# Patient Record
Sex: Female | Born: 1935 | Race: White | Hispanic: No | Smoking: Never smoker
Health system: Southern US, Community
[De-identification: ages and names within clinical notes are randomized; demographics above are authoritative.]

## PROBLEM LIST (undated history)

## (undated) DIAGNOSIS — I10 Essential (primary) hypertension: Secondary | ICD-10-CM

## (undated) DIAGNOSIS — E785 Hyperlipidemia, unspecified: Secondary | ICD-10-CM

## (undated) DIAGNOSIS — I442 Atrioventricular block, complete: Secondary | ICD-10-CM

## (undated) DIAGNOSIS — I480 Paroxysmal atrial fibrillation: Secondary | ICD-10-CM

## (undated) HISTORY — PX: PULSE GENERATOR IMPLANT: SHX5370

## (undated) HISTORY — DX: Atrioventricular block, complete: I44.2

## (undated) HISTORY — PX: ABDOMINAL HYSTERECTOMY: SHX81

## (undated) HISTORY — PX: EYE SURGERY: SHX253

## (undated) HISTORY — DX: Paroxysmal atrial fibrillation: I48.0

## (undated) HISTORY — DX: Hyperlipidemia, unspecified: E78.5

## (undated) HISTORY — DX: Essential (primary) hypertension: I10

## (undated) HISTORY — PX: GALLBLADDER SURGERY: SHX652

---

## 1997-04-01 HISTORY — PX: OTHER SURGICAL HISTORY: SHX169

## 1997-12-12 ENCOUNTER — Inpatient Hospital Stay (HOSPITAL_COMMUNITY): Admission: AD | Admit: 1997-12-12 | Discharge: 1997-12-14 | Payer: Self-pay | Admitting: Cardiology

## 1997-12-14 ENCOUNTER — Encounter: Payer: Self-pay | Admitting: *Deleted

## 2004-04-01 DIAGNOSIS — I442 Atrioventricular block, complete: Secondary | ICD-10-CM

## 2004-04-01 HISTORY — DX: Atrioventricular block, complete: I44.2

## 2004-04-28 ENCOUNTER — Ambulatory Visit: Payer: Self-pay | Admitting: Cardiology

## 2004-05-11 ENCOUNTER — Ambulatory Visit: Payer: Self-pay | Admitting: Cardiology

## 2004-06-14 ENCOUNTER — Ambulatory Visit: Payer: Self-pay | Admitting: Cardiology

## 2004-06-15 ENCOUNTER — Ambulatory Visit: Payer: Self-pay | Admitting: Cardiology

## 2004-06-20 ENCOUNTER — Ambulatory Visit: Payer: Self-pay | Admitting: Cardiology

## 2004-06-20 ENCOUNTER — Ambulatory Visit (HOSPITAL_COMMUNITY): Admission: RE | Admit: 2004-06-20 | Discharge: 2004-06-20 | Payer: Self-pay | Admitting: Cardiology

## 2004-07-25 ENCOUNTER — Ambulatory Visit: Payer: Self-pay | Admitting: Cardiology

## 2004-07-25 ENCOUNTER — Ambulatory Visit: Payer: Self-pay | Admitting: Internal Medicine

## 2004-10-18 ENCOUNTER — Ambulatory Visit: Payer: Self-pay | Admitting: Cardiology

## 2005-05-01 ENCOUNTER — Ambulatory Visit: Payer: Self-pay

## 2005-06-27 ENCOUNTER — Ambulatory Visit: Payer: Self-pay | Admitting: Cardiology

## 2005-10-04 ENCOUNTER — Ambulatory Visit: Payer: Self-pay | Admitting: Internal Medicine

## 2006-01-06 ENCOUNTER — Ambulatory Visit: Payer: Self-pay | Admitting: Internal Medicine

## 2006-03-31 ENCOUNTER — Ambulatory Visit: Payer: Self-pay | Admitting: Internal Medicine

## 2006-06-23 ENCOUNTER — Ambulatory Visit: Payer: Self-pay | Admitting: Internal Medicine

## 2006-09-15 ENCOUNTER — Ambulatory Visit: Payer: Self-pay | Admitting: Internal Medicine

## 2006-10-30 ENCOUNTER — Ambulatory Visit: Payer: Self-pay | Admitting: Cardiology

## 2006-12-08 ENCOUNTER — Ambulatory Visit: Payer: Self-pay | Admitting: Internal Medicine

## 2007-03-02 ENCOUNTER — Ambulatory Visit: Payer: Self-pay | Admitting: Internal Medicine

## 2007-04-09 ENCOUNTER — Ambulatory Visit: Payer: Self-pay | Admitting: Cardiology

## 2007-06-01 ENCOUNTER — Ambulatory Visit: Payer: Self-pay | Admitting: Internal Medicine

## 2007-08-31 ENCOUNTER — Ambulatory Visit: Payer: Self-pay | Admitting: Internal Medicine

## 2007-10-09 ENCOUNTER — Ambulatory Visit: Payer: Self-pay | Admitting: Cardiology

## 2008-02-29 ENCOUNTER — Ambulatory Visit: Payer: Self-pay | Admitting: Internal Medicine

## 2008-05-30 ENCOUNTER — Ambulatory Visit: Payer: Self-pay | Admitting: Internal Medicine

## 2008-07-18 ENCOUNTER — Encounter: Payer: Self-pay | Admitting: Cardiology

## 2008-08-31 ENCOUNTER — Ambulatory Visit: Payer: Self-pay | Admitting: Internal Medicine

## 2008-10-30 DIAGNOSIS — I10 Essential (primary) hypertension: Secondary | ICD-10-CM | POA: Insufficient documentation

## 2008-10-30 DIAGNOSIS — E785 Hyperlipidemia, unspecified: Secondary | ICD-10-CM | POA: Insufficient documentation

## 2008-10-30 DIAGNOSIS — Z950 Presence of cardiac pacemaker: Secondary | ICD-10-CM | POA: Insufficient documentation

## 2008-10-31 ENCOUNTER — Ambulatory Visit: Payer: Self-pay | Admitting: Cardiology

## 2008-11-30 ENCOUNTER — Ambulatory Visit: Payer: Self-pay | Admitting: Internal Medicine

## 2009-03-01 ENCOUNTER — Encounter: Payer: Self-pay | Admitting: Internal Medicine

## 2009-03-01 ENCOUNTER — Ambulatory Visit: Payer: Self-pay | Admitting: Internal Medicine

## 2009-04-24 ENCOUNTER — Ambulatory Visit: Payer: Self-pay

## 2009-04-24 ENCOUNTER — Encounter: Payer: Self-pay | Admitting: Cardiology

## 2009-05-31 ENCOUNTER — Ambulatory Visit: Payer: Self-pay | Admitting: Internal Medicine

## 2009-06-12 ENCOUNTER — Encounter: Payer: Self-pay | Admitting: Internal Medicine

## 2009-08-30 ENCOUNTER — Ambulatory Visit: Payer: Self-pay | Admitting: Internal Medicine

## 2009-11-15 ENCOUNTER — Ambulatory Visit: Payer: Self-pay | Admitting: Cardiology

## 2010-01-26 ENCOUNTER — Encounter (INDEPENDENT_AMBULATORY_CARE_PROVIDER_SITE_OTHER): Payer: Self-pay | Admitting: *Deleted

## 2010-02-28 ENCOUNTER — Ambulatory Visit: Payer: Self-pay | Admitting: Internal Medicine

## 2010-04-25 ENCOUNTER — Encounter: Payer: Self-pay | Admitting: Cardiology

## 2010-04-25 ENCOUNTER — Ambulatory Visit
Admission: RE | Admit: 2010-04-25 | Discharge: 2010-04-25 | Payer: Self-pay | Source: Home / Self Care | Attending: Cardiology | Admitting: Cardiology

## 2010-04-25 ENCOUNTER — Ambulatory Visit: Admission: RE | Admit: 2010-04-25 | Discharge: 2010-04-25 | Payer: Self-pay | Source: Home / Self Care

## 2010-04-25 DIAGNOSIS — I442 Atrioventricular block, complete: Secondary | ICD-10-CM | POA: Insufficient documentation

## 2010-04-26 ENCOUNTER — Encounter: Payer: Self-pay | Admitting: Cardiology

## 2010-05-03 NOTE — Assessment & Plan Note (Signed)
Summary: PACER CHECK      Allergies Added: NKDA  Current Medications (verified): 1)  Hydrochlorothiazide 25 Mg Tabs (Hydrochlorothiazide) .... Take One-Half  Tablet By Mouth Daily. 2)  Diltiazem Hcl Er Beads 240 Mg Xr24h-Cap (Diltiazem Hcl Er Beads) .... Take One Capsule By Mouth Daily 3)  Aspirin 81 Mg Tbec (Aspirin) .... Take One Tablet By Mouth Daily 4)  Multivitamins   Tabs (Multiple Vitamin) .Marland Kitchen.. 1 Tab Once Daily 5)  Simvastatin 40 Mg Tabs (Simvastatin) .... Take One Tablet By Mouth Daily At Bedtime 6)  Metoprolol Succinate 50 Mg Xr24h-Tab (Metoprolol Succinate) .... Take One Tablet By Mouth Daily 7)  Synthroid 75 Mcg Tabs (Levothyroxine Sodium) .... Take 1/2 Tablet By Mouth Daily 8)  Zolpidem Tartrate 5 Mg Tabs (Zolpidem Tartrate) .... Take 1 Tablet By Mouth Once A Day 9)  Loratadine 10 Mg Tabs (Loratadine) .... Take One Tab By Mouth Once Daily As Needed 10)  Alprazolam 0.25 Mg Tabs (Alprazolam) .... Take One Tab By Mouth Two Times A Day As Needed  Allergies (verified): No Known Drug Allergies   PPM Specifications Following MD:  Everardo Beals. Juanda Chance, MD     PPM Vendor:  [employer]     PPM Model Number:  [id number]     PPM Serial Number:  [id number] PPM DOI:  06/20/2004     PPM Implanting MD:  Everardo Beals. Juanda Chance, MD  Lead 1    Location: RA     DOI: 06/21/1996     Model #: 6237     Serial #: [id number]     Status: active Lead 2    Location: RV     DOI: 06/21/1996     Model #: 1761     Serial #: [id number]     Status: active   Indications:  SSS   PPM Follow Up Battery Voltage:  GOOD V     Battery Est. Longevity:  >5 YRS     Pacer Dependent:  No       PPM Device Measurements Atrium  Amplitude: 1.8 mV, Impedance: 530 ohms, Threshold: 0.8 V at 0.40 msec Right Ventricle  Amplitude: PACED mV, Impedance: 1090 ohms, Threshold: 0.6 V at 0.40 msec  Episodes MS Episodes:  5     Percent Mode Switch:  <1%     Coumadin:  No Ventricular High Rate:  0     Atrial Pacing:  73%     Ventricular Pacing:   100%  Parameters Mode:  DDDR     Lower Rate Limit:  60     Upper Rate Limit:  130 Paced AV Delay:  150     Sensed AV Delay:  80 Next Cardiology Appt Due:  10/01/2010 Tech Comments:  5 ATRIAL EPISODES--ALL NOISE ON ATRIAL LEAD.  LONGEST WAS 9 SECONDS.  NORMAL DEVICE FUNCTION.  HISTOGRAMS FLAT ---TURNED RATE RESPONSE ON TO POSSIBLY HELP W/FATIGUE AND SOB.  PT TO BE FOLLOWED BY DR [facility] IN [place] AND PACER CHECKS IN [place]. TTMs THRU [facility].  ROV IN 6 MTHS W/DEVICE CLINIC IN [place]. Vella Kohler  April 25, 2010 9:30 AM

## 2010-05-03 NOTE — Procedures (Signed)
Summary: pacer check  Medications Added SYNTHROID 75 MCG TABS (LEVOTHYROXINE SODIUM) Take 1/2 tablet by mouth daily ZOLPIDEM TARTRATE 5 MG TABS (ZOLPIDEM TARTRATE) Take 1 tablet by mouth once a day      Allergies Added: NKDA  Current Medications (verified): 1)  Hydrochlorothiazide 25 Mg Tabs (Hydrochlorothiazide) .... Take One-Half  Tablet By Mouth Daily. 2)  Diltiazem Hcl Er Beads 240 Mg Xr24h-Cap (Diltiazem Hcl Er Beads) .... Take One Capsule By Mouth Daily 3)  Aspirin 81 Mg Tbec (Aspirin) .... Take One Tablet By Mouth Daily 4)  Multivitamins   Tabs (Multiple Vitamin) .Marland Kitchen.. 1 Tab Once Daily 5)  Simvastatin 40 Mg Tabs (Simvastatin) .... Take One Tablet By Mouth Daily At Bedtime 6)  Metoprolol Succinate 50 Mg Xr24h-Tab (Metoprolol Succinate) .... Take One Tablet By Mouth Daily 7)  Synthroid 75 Mcg Tabs (Levothyroxine Sodium) .... Take 1/2 Tablet By Mouth Daily 8)  Zolpidem Tartrate 5 Mg Tabs (Zolpidem Tartrate) .... Take 1 Tablet By Mouth Once A Day  Allergies (verified): No Known Drug Allergies   PPM Specifications Following MD:  Everardo Beals. Juanda Chance, MD     PPM Vendor:  [employer]     PPM Model Number:  [id number]     PPM Serial Number:  [id number] PPM DOI:  06/20/2004     PPM Implanting MD:  Everardo Beals. Juanda Chance, MD  Lead 1    Location: RA     DOI: 06/21/1996     Model #: 2130     Serial #: [id number]     Status: active Lead 2    Location: RV     DOI: 06/21/1996     Model #: 6962     Serial #: [id number]     Status: active   Indications:  SSS   PPM Follow Up Remote Check?  No Battery Voltage:  MOL V     Battery Est. Longevity:  >5 YEARS     Pacer Dependent:  No       PPM Device Measurements Atrium  Amplitude: 1.9 mV, Impedance: 520 ohms, Threshold: 0.7 V at 0.4 msec Right Ventricle  Amplitude: 12 mV, Impedance: 1260 ohms, Threshold: 0.9 V at 0.4 msec  Episodes MS Episodes:  2     Percent Mode Switch:  0%     Coumadin:  No Ventricular High Rate:  0     Atrial Pacing:  66%     Ventricular  Pacing:  99%  Parameters Mode:  DDD     Lower Rate Limit:  60     Upper Rate Limit:  130 Paced AV Delay:  150     Sensed AV Delay:  80 Next Cardiology Appt Due:  09/29/2009 Tech Comments:  Normal device function.  Both mode switch episodes are noise on atrial lead.  Unable to reproduce today.  Both less than 1 minute. Impedences, thresholds, sensing stable.  Will monitor for now.  Pt does TTM's with [facility].  ROV 6 months BB.  When Dr Angela Cox, pt prefers to be followed in [place] by Dr Antoine Poche and have device checks done in the [place] office. Gypsy Balsam RN BSN  April 24, 2009 11:46 AM

## 2010-05-03 NOTE — Assessment & Plan Note (Signed)
Summary: YEARLY F/U  Medications Added LORATADINE 10 MG TABS (LORATADINE) take one tab by mouth once daily as needed ALPRAZOLAM 0.25 MG TABS (ALPRAZOLAM) take one tab by mouth two times a day as needed      Allergies Added: NKDA  Visit Type:  Follow-up Primary Provider:  Dr Gae Gallop in [place]  CC:  No complaints..  History of Present Illness: . She had a Guidant DDD pacemaker implanted 2006 for AV block. On previous interrogation she has had mode switches which have been thought to be due to noise on the atrial lead. We tried to program around this but have been unable to do so. Her generator is also on recall but the probably of problems aren't low and replaced it is not recommended.  She's been doing quite well has had no chest pain shortness of breath or palpitations.  Her husband passed away about a year and a half ago and she now lives alone although she has children nearby.  Her other problems include hypertension and hyperlipidemia.  She would prefer cardiology followup in [place] and pacemaker follow up in [place] since she lives in [place].  Current Medications (verified): 1)  Hydrochlorothiazide 25 Mg Tabs (Hydrochlorothiazide) .... Take One-Half  Tablet By Mouth Daily. 2)  Diltiazem Hcl Er Beads 240 Mg Xr24h-Cap (Diltiazem Hcl Er Beads) .... Take One Capsule By Mouth Daily 3)  Aspirin 81 Mg Tbec (Aspirin) .... Take One Tablet By Mouth Daily 4)  Multivitamins   Tabs (Multiple Vitamin) .Marland Kitchen.. 1 Tab Once Daily 5)  Simvastatin 40 Mg Tabs (Simvastatin) .... Take One Tablet By Mouth Daily At Bedtime 6)  Metoprolol Succinate 50 Mg Xr24h-Tab (Metoprolol Succinate) .... Take One Tablet By Mouth Daily 7)  Synthroid 75 Mcg Tabs (Levothyroxine Sodium) .... Take 1/2 Tablet By Mouth Daily 8)  Zolpidem Tartrate 5 Mg Tabs (Zolpidem Tartrate) .... Take 1 Tablet By Mouth Once A Day 9)  Loratadine 10 Mg Tabs (Loratadine) .... Take One Tab By Mouth Once Daily As Needed 10)  Alprazolam 0.25 Mg  Tabs (Alprazolam) .... Take One Tab By Mouth Two Times A Day As Needed  Allergies (verified): No Known Drug Allergies  Past History:  Past Medical History: Reviewed history from 10/30/2008 and no changes required.  PAST MEDICAL HISTORY:  1. Status post Guidant Ultra dual-mode, dual-pacing, dual-sensing     generator implanted for complete atrioventricular block 2006 with     some noise on the atrial lead and generator recall for very low incidence of loss of output. 2. Hypertension. 3. Hyperlipidemia. 4. Normal coronary angiography in 1999.      Review of Systems       ROS is negative except as outlined in HPI.   Vital Signs:  Patient profile:   77 year old female Weight:      142 pounds BMI:     26.93 Pulse rate:   60 / minute BP sitting:   140 / 70  Vitals Entered By: Sherri Rad, RN, BSN (November 15, 2009 9:39 AM)  Physical Exam  Additional Exam:  Gen. Well-nourished, in no distress   Neck: No JVD, thyroid not enlarged, no carotid bruits Lungs: No tachypnea, clear without rales, rhonchi or wheezes Cardiovascular: Rhythm regular, PMI not displaced,  heart sounds  normal, no murmurs or gallops, no peripheral edema, pulses normal in all 4 extremities. Abdomen: BS normal, abdomen soft and non-tender without masses or organomegaly, no hepatosplenomegaly. MS: No deformities, no cyanosis or clubbing   Neuro:  No focal  sns   Skin:  no lesions    PPM Specifications Following MD:  Everardo Beals. Juanda Chance, MD     PPM Vendor:  [employer]     PPM Model Number:  [id number]     PPM Serial Number:  [id number] PPM DOI:  06/20/2004     PPM Implanting MD:  Everardo Beals. Juanda Chance, MD  Lead 1    Location: RA     DOI: 06/21/1996     Model #: 0981     Serial #: [id number]     Status: active Lead 2    Location: RV     DOI: 06/21/1996     Model #: 2956     Serial #: [id number]     Status: active   Indications:  SSS   PPM Follow Up Battery Voltage:  GOOD V     Battery Est. Longevity:  >5 YRS     Pacer  Dependent:  No       PPM Device Measurements Atrium  Amplitude: 1.8 mV, Impedance: 550 ohms, Threshold: 0.7 V at 0.40 msec Right Ventricle  Amplitude: PACED mV, Impedance: 1200 ohms, Threshold: 0.6 V at 0.40 msec  Episodes MS Episodes:  4     Percent Mode Switch:  0%     Coumadin:  No Ventricular High Rate:  0     Atrial Pacing:  66%     Ventricular Pacing:  98%  Parameters Mode:  DDD     Lower Rate Limit:  60     Upper Rate Limit:  130 Paced AV Delay:  150     Sensed AV Delay:  80 Next Cardiology Appt Due:  05/02/2010 Tech Comments:  4 ATR EPISODES--LONGEST WAS 9 SECONDS.  2 EPISODES WERE NOISE ON ATRIAL LEAD.  TURNED RATE RESPONSE ON DURING MODE SWITCH AND CHANGED RA OUTPUT FROM 2.5 TO 2.0 V.  ROV IN 6 MTHS W/DEVICE CLINIC IN [place].  PT TRANSFERRING CARE TO DR [facility]. Vella Kohler  November 15, 2009 10:20 AM  Impression & Recommendations:  Problem # 1:  PACEMAKER (ICD-V45.Marland Kitchen01) She has a Guidant DDD pacemaker implanted for AV block. We will interrogate her pacemaker today.  We interrogated the pacemaker today and she had 4 mode switches to which were related to noise. She is greater than 5 years left on her pacemaker longevity. We will plan followup in [place] in 6 months for pacemaker check.  We will make her regular followups with our EP died in [place].  Problem # 2:  HYPERTENSION, BENIGN (ICD-401.1) This appears well controlled on current medications. Her updated medication list for this problem includes:    Hydrochlorothiazide 25 Mg Tabs (Hydrochlorothiazide) .Marland Kitchen... Take one-half  tablet by mouth daily.    Diltiazem Hcl Er Beads 240 Mg Xr24h-cap (Diltiazem hcl er beads) .Marland Kitchen... Take one capsule by mouth daily    Aspirin 81 Mg Tbec (Aspirin) .Marland Kitchen... Take one tablet by mouth daily    Metoprolol Succinate 50 Mg Xr24h-tab (Metoprolol succinate) .Marland Kitchen... Take one tablet by mouth daily  Problem # 3:  HYPERLIPIDEMIA-MIXED (ICD-272.4) This is managed with simvastatin. She had normal coronary  angiography in 1999. Her updated medication list for this problem includes:    Simvastatin 40 Mg Tabs (Simvastatin) .Marland Kitchen... Take one tablet by mouth daily at bedtime  Patient Instructions: 1)  Your physician wants you to follow-up in: 6 months with Dr. Antoine Poche in the [facility] office for regular cardiology followup and a device check.  You will receive a reminder letter in  the mail two months in advance. If you don't receive a letter, please call our office to schedule the follow-up appointment. 2)  Your physician recommends that you continue on your current medications as directed. Please refer to the Current Medication list given to you today.

## 2010-05-03 NOTE — Cardiovascular Report (Signed)
Summary: Office Visit   Office Visit   Imported By: Roderic Ovens 11/17/2009 13:48:00  _____________________________________________________________________  External Attachment:    Type:   Image     Comment:   External Document

## 2010-05-03 NOTE — Assessment & Plan Note (Signed)
Summary: [facility]      Allergies Added: NKDA  Visit Type:  Follow-up Primary Provider:  Dr Gae Gallop in [place]  CC:  [facility].  History of Present Illness: The patient presents for yearly followup of known coronary disease. She was previously seen by Dr. Juanda Chance.  Since she last saw her she has had no new cardiovascular complaints. She does her household she works in activities of daily living without limitations. She has no presyncope or syncope. She has no chest pressure, neck or arm discomfort. She has no shortness of breath, PND or orthopnea. She had her pacemaker interrogated today with results as below with no significant abnormalities.  Current Medications (verified): 1)  Hydrochlorothiazide 25 Mg Tabs (Hydrochlorothiazide) .... Take One-Half  Tablet By Mouth Daily. 2)  Diltiazem Hcl Er Beads 240 Mg Xr24h-Cap (Diltiazem Hcl Er Beads) .... Take One Capsule By Mouth Daily 3)  Aspirin 81 Mg Tbec (Aspirin) .... Take One Tablet By Mouth Daily 4)  Multivitamins   Tabs (Multiple Vitamin) .Marland Kitchen.. 1 Tab Once Daily 5)  Simvastatin 40 Mg Tabs (Simvastatin) .... Take One Tablet By Mouth Daily At Bedtime 6)  Metoprolol Succinate 50 Mg Xr24h-Tab (Metoprolol Succinate) .... Take One Tablet By Mouth Daily 7)  Synthroid 75 Mcg Tabs (Levothyroxine Sodium) .... Take 1/2 Tablet By Mouth Daily 8)  Zolpidem Tartrate 5 Mg Tabs (Zolpidem Tartrate) .... Take 1 Tablet By Mouth Once A Day 9)  Loratadine 10 Mg Tabs (Loratadine) .... Take One Tab By Mouth Once Daily As Needed 10)  Alprazolam 0.25 Mg Tabs (Alprazolam) .... Take One Tab By Mouth Two Times A Day As Needed  Allergies (verified): No Known Drug Allergies  Past History:  Past Medical History: Reviewed history from 10/30/2008 and no changes required.  PAST MEDICAL HISTORY:  1. Status post Guidant Ultra dual-mode, dual-pacing, dual-sensing     generator implanted for complete atrioventricular block 2006 with     some noise on the atrial lead  and generator recall for very low incidence of loss of output. 2. Hypertension. 3. Hyperlipidemia. 4. Normal coronary angiography in 1999.      Review of Systems       As stated in the HPI and negative for all other systems.   Vital Signs:  Patient profile:   77 year old female Height:      61 inches Weight:      144 pounds BMI:     27.31 Pulse rate:   63 / minute Resp:     16 per minute BP sitting:   120 / 72  (right arm)  Vitals Entered By: Marrion Coy, CNA (April 25, 2010 1:01 PM)  Physical Exam  General:  Well developed, well nourished, in no acute distress. Head:  normocephalic and atraumatic Neck:  Neck supple, no JVD. No masses, thyromegaly or abnormal cervical nodes. Chest Wall:  no deformities well healed sternotomy scar Lungs:  Clear bilaterally to auscultation and percussion. Heart:  Non-displaced PMI, chest non-tender; regular rate and rhythm, S1, S2 without murmurs, rubs or gallops. Carotid upstroke normal, no bruit. Normal abdominal aortic size, no bruits. Femorals normal pulses, no bruits. Pedals normal pulses. No edema, no varicosities. Abdomen:  Bowel sounds positive; abdomen soft and non-tender without masses, organomegaly, or hernias noted. No hepatosplenomegaly. Msk:  Back normal, normal gait. Muscle strength and tone normal. Extremities:  No clubbing or cyanosis. Neurologic:  Alert and oriented x 3. Skin:  Intact without lesions or rashes. Cervical Nodes:  no significant adenopathy Psych:  Normal affect.   EKG  Procedure date:  04/25/2010  Findings:      atrioventricular pacing  PPM Specifications Following MD:  Everardo Beals. Juanda Chance, MD     PPM Vendor:  [employer]     PPM Model Number:  [phone]     PPM Serial Number:  [id number] PPM DOI:  06/20/2004     PPM Implanting MD:  Everardo Beals. Juanda Chance, MD  Lead 1    Location: RA     DOI: 06/21/1996     Model #: 0981     Serial #: [id number]     Status: active Lead 2    Location: RV     DOI: 06/21/1996     Model  #: 2956     Serial #: [id number]     Status: active   Indications:  SSS   PPM Follow Up Pacer Dependent:  No      Episodes Coumadin:  No  Parameters Mode:  DDD     Lower Rate Limit:  60     Upper Rate Limit:  130 Paced AV Delay:  150     Sensed AV Delay:  80  Impression & Recommendations:  Problem # 1:  AV BLOCK, COMPLETE (ICD-426.0) The patient has no new symptoms. She has normal pacemaker function. No change in therapy is indicated. Orders: EKG w/ Interpretation (93000)  Problem # 2:  HYPERTENSION, BENIGN (ICD-401.1) Her blood pressure is well controlled. She will continue the medications as listed.  Patient Instructions: 1)  Your physician recommends that you continue on your current medications as directed. Please refer to the Current Medication list given to you today. 2)  Your physician wants you to follow-up in:   1 year with Dr Antoine Poche.  You will receive a reminder letter in the mail two months in advance. If you don't receive a letter, please call our office to schedule the follow-up appointment.

## 2010-05-03 NOTE — Miscellaneous (Signed)
Summary: corrected device information  Clinical Lists Changes  Observations: Added new observation of MAGNET RTE: BOL 100 ERI 85 (04/26/2010 8:01) Added new observation of PPMLEADSER1: [id number] (04/26/2010 8:01)      PPM Specifications Following MD:  Everardo Beals. Juanda Chance, MD     PPM Vendor:  [employer]     PPM Model Number:  [id number]     PPM Serial Number:  [id number] PPM DOI:  06/20/2004     PPM Implanting MD:  Everardo Beals. Juanda Chance, MD  Lead 1    Location: RA     DOI: 06/21/1996     Model #: 4524     Serial #: [id number]     Status: active Lead 2    Location: RV     DOI: 06/21/1996     Model #: 8657     Serial #: [id number]     Status: active  Magnet Response Rate:  BOL 100 ERI 85  Indications:  SSS   PPM Follow Up Pacer Dependent:  No      Episodes Coumadin:  No  Parameters Mode:  DDDR     Lower Rate Limit:  60     Upper Rate Limit:  130 Paced AV Delay:  150     Sensed AV Delay:  80

## 2010-05-03 NOTE — Cardiovascular Report (Signed)
Summary: Office Visit   Office Visit   Imported By: Roderic Ovens 05/09/2009 15:40:48  _____________________________________________________________________  External Attachment:    Type:   Image     Comment:   External Document

## 2010-05-03 NOTE — Cardiovascular Report (Signed)
Summary: TTM   TTM   Imported By: Roderic Ovens 03/09/2010 11:24:22  _____________________________________________________________________  External Attachment:    Type:   Image     Comment:   External Document

## 2010-05-03 NOTE — Cardiovascular Report (Signed)
Summary: Transtelephonic Pacemaker Monitoring Report  Transtelephonic Pacemaker Monitoring Report   Imported By: Debby Freiberg 09/27/2009 14:23:44  _____________________________________________________________________  External Attachment:    Type:   Image     Comment:   External Document

## 2010-05-03 NOTE — Miscellaneous (Signed)
Summary: dx code correction   Clinical Lists Changes  Problems: Changed problem from PACEMAKER (ICD-V45.Marland Kitchen01) to PACEMAKER, PERMANENT (ICD-V45.01) changed the incorrect dx code to correct dx code Genella Mech  January 26, 2010 11:08 AM

## 2010-05-09 ENCOUNTER — Ambulatory Visit: Payer: Self-pay | Admitting: Cardiology

## 2010-05-30 ENCOUNTER — Encounter: Payer: Self-pay | Admitting: Cardiology

## 2010-05-30 DIAGNOSIS — I495 Sick sinus syndrome: Secondary | ICD-10-CM

## 2010-08-14 NOTE — Assessment & Plan Note (Signed)
[facility]                            CARDIOLOGY OFFICE NOTE   NAME:[name] [name], [name] [name]                       MRN:          [id number]  DATE:10/30/2006                            DOB:          November 09, 1924    ADDENDUM:  On review of the pacing leads on [name], [name] found  that the ventricular lead tends to run high impedances so that is no  problem. Will plan to program the sensitivity from 0.75 up to 1 to help  eliminate the noise interference.  Her P waves measured at 2.2.   I will plan to see her back in 6 months instead of a year to see if we  need to make any further adjustments and see if the change in her  sensitivity corrects the interference problem.     Bruce Elvera Lennox Juanda Chance, MD, [facility]  Electronically Signed    BRB/MedQ  DD: 10/30/2006  DT: 10/30/2006  Job #: [id number]

## 2010-08-14 NOTE — Assessment & Plan Note (Signed)
[facility]                            CARDIOLOGY OFFICE NOTE   NAME:[name], [name] LORDS                       MRN:          [id number]  DATE:10/09/2007                            DOB:          [birth year]    PRIMARY CARE PHYSICIAN:  Doreen Beam, MD   CLINICAL HISTORY:  [name] [name] is 77 years old and had a Guidant Ultra DDD  generator implanted in 2006 for high-degree AV block.  She has done  quite well since that time.  She has had no recent symptoms of chest  pain, shortness breath, or palpitations.   When we checked her previously, she has had some mode switches, which  have been recorded as noise in the atrial lead.  Etiology of this has  not been clear, but they have been infrequent and short lived, and they  have not caused any problem.  We did adjust her sensitivity to try and  eliminate the mode switches, but she still has those.  Her generator was  also on recall, but because of very low incidence of lack of output, but  this risk was thought to be so low, that replacement was not recommended  even in the patients who are pacer dependent.   PAST MEDICAL HISTORY:  Significant for:  1. Hypertension.  2. Hyperlipidemia.  3. She had normal coronary angiography in 1999.   CURRENT MEDICATIONS:  1. Metoprolol 50 mg b.i.d.  2. Diltiazem 240 mg daily.  3. Simvastatin 40 mg daily.  4. Aspirin 81 mg daily.   PHYSICAL EXAMINATION:  VITAL SIGNS:  The blood pressure is 144/73 and  the pulse 60 and regular.  NECK:  There was no venous distention.  Carotid pulses were full without  bruits.  CHEST:  Clear.  CARDIAC:  Rhythm was regular.  There were no murmurs or gallops.  ABDOMEN:  Soft without organomegaly.  EXTREMITIES:  Peripheral pulses were full with no peripheral edema.   We interrogated her pacemaker.  She did have some mode switches, which  were related to noise.  Otherwise, her pacer function was good.   IMPRESSION:  1. Status post Guidant  Ultra dual-mode, dual-pacing, dual-sensing      generator implanted for complete atrioventricular block 2006 with      some noise on the atrial lead.  2. Hypertension.  3. Hyperlipidemia.  4. Normal coronary angiography in 1999.   RECOMMENDATIONS:  I think [name] [name] is doing quite well.  We will plan to  continue on phone checks and plan to see her back in followup in a year.     Bruce Elvera Lennox Juanda Chance, MD, [facility]  Electronically Signed    BRB/MedQ  DD: 10/09/2007  DT: 10/09/2007  Job #: [id number]

## 2010-08-14 NOTE — Assessment & Plan Note (Signed)
[facility]                            CARDIOLOGY OFFICE NOTE   NAME:[name] [name], [name] [name]                       MRN:          [id number]  DATE:10/30/2006                            DOB:          [birth year]    PRIMARY CARE PHYSICIAN:  Doreen Beam, MD   CLINICAL HISTORY:  [name] [name] is 77 years old and had a pacemaker  implanted remotely for AV block and in March of 2006 had a generator  change with a Guidant Ultra DD generator. This generator subsequently  was on recall, but the problem was  low and replacement was not  recommended. She has been doing quite well since then and has had no  recent chest pain, shortness of breath or palpitations.   PAST MEDICAL HISTORY:  1. Hypertension.  2. Hyperlipidemia.  3. She has had normal coronary angiography in 1999.   CURRENT MEDICATIONS:  1. Lipitor.  2. Toprol.  3. Diltiazem.  4. Aspirin.   PHYSICAL EXAMINATION:  Blood pressure is 137/82, pulse 62 and regular.  There was no venous distention. The carotid pulses were full without  bruits.  CHEST: Was clear.  CARDIAC: Rhythm was regular. I could hear no murmurs or gallops.  ABDOMEN: Was soft with normal bowel sounds. There was no  hepatosplenomegaly.  The peripheral pulses were full and there was no peripheral edema.   We interrogated her pacemaker and she is pacer dependent with underlying  AV block. She had episodes of rapid atrial rates with mode switch, but  it appeared this was related to interference on her atrial lead. On her  history, we have not document any exposure to any electromagnetic  interference. She also had a high impendence on the ventricular lead  that was about 1100. Her threshold was good and we did not measure an R-  wave because of the pacer dependence.   Burke Keels is checking on these leads with Medtronic to help in a  decision whether we need to do anything differently. Will probably  reprogram her mode switch rate up to  minimize the effects of any noise  and we may change her sensitivity.   ADDENDUM:  On review of the pacing leads on [name] [name], Burke Keels found  that the ventricular lead tends to run high impedances so that is no  problem. Will plan to program the sensitivity from 0.75 up to 1 to help  eliminate the noise interference.  Her P waves measured at 2.2.   I will plan to see her back in 6 months instead of a year to see if we  need to make any further adjustments and see if the change in her  sensitivity corrects the interference problem.     Bruce Elvera Lennox Juanda Chance, MD, [facility]  Electronically Signed    BRB/MedQ  DD: 10/30/2006  DT: 10/30/2006  Job #: [id number]

## 2010-08-14 NOTE — Assessment & Plan Note (Signed)
[facility]                            CARDIOLOGY OFFICE NOTE   NAME:Calixto, JOURNEI THOMASSEN                       MRN:          [id number]  DATE:04/09/2007                            DOB:          1925-01-17    PRIMARY CARE PHYSICIAN:  Dr. Doreen Beam in [place].   CLINICAL HISTORY:  Ms. Lecomte is 77 years old and has a Guidant Ultra DDD  generator change in 2006 with underlying high degree AV block.  Her  Guidant generator is on recall but the probability of problems is very  small and replacement was not recommended.  She also has had some noise  on the atrial lead and we adjusted the atrial sensitivity from 0.75 to  1.0 last time.   She has been feeling well and has had no symptoms of chest pain,  shortness breath or palpitations.   PAST MEDICAL HISTORY:  Significant for hypertension, hyperlipidemia.  She had normal coronary angiography in 1999.   CURRENT MEDICATIONS:  Toprol, diltiazem, aspirin and simvastatin.   EXAMINATION:  The blood pressure was 150/84 and the pulse 91 and  regular.  There was no venous distention.  The carotid pulses were full  without bruits.  CHEST:  Clear without rales or rhonchi.  The cardiac rhythm was regular.  I hear no murmurs or gallops.  ABDOMEN:  Soft without organomegaly.  Peripheral pulses were full and there was no peripheral edema.   IMPRESSION:  1. Status post Guidant Ultra DDD generator replacement for a complete      atrioventricular block, stable.  2. Hypertension.  3. Hyperlipidemia.  4. Normal coronary angiography in 1999.   RECOMMENDATIONS:  Ms. Bribiesca blood pressure is still somewhat elevated  and we will increase her diltiazem from 180 to 240 daily.  We  interrogated her pacemaker today and she still had some noise in the  atrial lead causing some inhibition, but these were very short lived and  infrequent and I do not think this should be causing any problem.  She  is pacer dependent, but should have a  ventricular escape  rhythm even if  she inhibits her atrium.  We will plan to see her back in follow-up in 6  months.    Bruce Elvera Lennox Juanda Chance, MD, [facility]  Electronically Signed   BRB/MedQ  DD: 04/09/2007  DT: 04/09/2007  Job #: [id number]

## 2010-08-17 NOTE — Cardiovascular Report (Signed)
NAME:  [name] [name], [name] [name]                ACCOUNT NO.:  [id number]   MEDICAL RECORD NO.:  [id number]          PATIENT TYPE:  OIB   LOCATION:  2855                         FACILITY:  [facility]   PHYSICIAN:  Charlies Constable, M.D. [facility] DATE OF BIRTH:  March 20, 1925   DATE OF PROCEDURE:  06/20/2004  DATE OF DISCHARGE:                              CARDIAC CATHETERIZATION   CLINICAL HISTORY:  [name] is 77 years old and had a Vigor CPI-DDD  pacemaker implanted in 1997 for a complete AV block at [facility].  She recently reached [facility] and in the office her pacemaker had reverted to VVI  mode and was unable to be interrogated.  We brought her in for elective  generator replacement today.   PROCEDURE:  Explantation of the old Guidant CPI-DV generator (model [id number],  serial [id number]) with inspection of the Medtronic atrial lead (model [id number],  serial [id number] implanted June 21, 1996) and inspection of the Medtronic  ventricular lead (model [id number], serial [id number] implanted June 21, 1996)  and insertion of a new Guidant Insignia Ultra DR device (model [id number], serial  [id number]).   ANESTHESIA:  1% local Xylocaine.   ESTIMATED BLOOD LOSS:  Less than 10 mL.   COMPLICATIONS:  None.   PROCEDURE:  The procedure was performed in laboratory room #1.  The left  anterior chest was prepped and draped in the usual fashion.  The skin and  subcutaneous tissue were anesthetized with 1% local Xylocaine.  An incision  was made over the pocket just below the prior incision and extended to the  pocket.  The pocket was opened and the generator was removed.  The patient  did have some asystole with manipulation of the leads and with the pacemaker  outside the pocket which reverted to pacing with putting the pacemaker back  in the pocket.  This was not totally related to the pacer being out of the  pocket, however, since the patient did pace with the generator outside the  pocket at other times.  The atrial and ventricular  leads were removed and  inspected with good parameters as described below.  The pocket was irrigated  with sterile kanamycin solution.  The old leads were attached to the new  generator and the generator was implanted into the pocket and secured to the  posterior aspect of the pocket with one suture of 1-0 silk.  Pocket had been  previously irrigated with sterile kanamycin solution.  The subcutaneous  tissue and pocket was closed with running 2-0 Dexon.  Skin was closed with  running 5-0 Dexon.   PACING PARAMETERS:  Atrial lead:  P-wave 3-4 millivolts.  __________ capture  was less than 1 volt at a pulse width of 0.5 milliseconds and the impedance  was 443 ohms.   Ventricular lead:  Patient was pacer dependent so there were no R-waves  measured.  The threshold was less than 1 volt at a pulse width of 0.5  milliseconds.  Impedance was 450 ohms.   Patient tolerated procedure well and left the laboratory in satisfactory  condition.      BB/MEDQ  D:  06/20/2004  T:  06/20/2004  Job:  [id number]   cc:   Doreen Beam  [address]  [place]  [place] [address]  Fax: [phone]

## 2010-08-29 ENCOUNTER — Encounter: Payer: Self-pay | Admitting: Cardiology

## 2010-08-29 DIAGNOSIS — I495 Sick sinus syndrome: Secondary | ICD-10-CM

## 2010-11-06 ENCOUNTER — Encounter: Payer: Self-pay | Admitting: Cardiology

## 2010-11-28 ENCOUNTER — Encounter: Payer: Self-pay | Admitting: Cardiology

## 2010-11-28 DIAGNOSIS — I495 Sick sinus syndrome: Secondary | ICD-10-CM

## 2010-12-06 ENCOUNTER — Encounter: Payer: Self-pay | Admitting: *Deleted

## 2010-12-27 ENCOUNTER — Ambulatory Visit (INDEPENDENT_AMBULATORY_CARE_PROVIDER_SITE_OTHER): Payer: Medicare Other | Admitting: *Deleted

## 2010-12-27 DIAGNOSIS — I442 Atrioventricular block, complete: Secondary | ICD-10-CM

## 2010-12-27 LAB — PACEMAKER DEVICE OBSERVATION
AL AMPLITUDE: 1.9 mv
BAMS-0001: 170 {beats}/min
BAMS-0002: 8 ms
BAMS-0003: 70 {beats}/min
DEVICE MODEL PM: 120475

## 2010-12-27 NOTE — Progress Notes (Signed)
Pacer check in clinic  

## 2011-02-19 ENCOUNTER — Other Ambulatory Visit: Payer: Self-pay

## 2011-02-19 MED ORDER — DILTIAZEM HCL ER 240 MG PO CP24: 240.0000 mg | ORAL_CAPSULE | Freq: Every day | ORAL | Status: DC

## 2011-02-27 ENCOUNTER — Encounter: Payer: Self-pay | Admitting: Cardiology

## 2011-02-27 DIAGNOSIS — I495 Sick sinus syndrome: Secondary | ICD-10-CM

## 2011-03-05 ENCOUNTER — Telehealth: Payer: Self-pay | Admitting: Cardiology

## 2011-03-05 NOTE — Telephone Encounter (Signed)
Patient was concerned when she thinks she can hear her pacemaker when she lies down.  I reassured her that the pacemaker doesn't make any noise and questioned if she was hearing a pulse type sound.  She also stated she was having some issues with her ears.  She is scheduled for an office visit in [facility] 03/15/11 for a pacer check and is to call ASAP if she has any dizziness or syncope.

## 2011-03-05 NOTE — Telephone Encounter (Signed)
Pt has question re her device

## 2011-03-15 ENCOUNTER — Ambulatory Visit (INDEPENDENT_AMBULATORY_CARE_PROVIDER_SITE_OTHER): Payer: Medicare Other | Admitting: *Deleted

## 2011-03-15 ENCOUNTER — Encounter: Payer: Self-pay | Admitting: Cardiology

## 2011-03-15 DIAGNOSIS — I442 Atrioventricular block, complete: Secondary | ICD-10-CM

## 2011-03-15 DIAGNOSIS — Z950 Presence of cardiac pacemaker: Secondary | ICD-10-CM

## 2011-03-15 LAB — PACEMAKER DEVICE OBSERVATION
AL AMPLITUDE: 1.9 mv
AL THRESHOLD: 0.7 V
BAMS-0001: 170 {beats}/min
BAMS-0002: 8 ms
DEVICE MODEL PM: 120475
RV LEAD AMPLITUDE: 12 mv
VENTRICULAR PACING PM: 100

## 2011-03-15 NOTE — Progress Notes (Signed)
Pacer check in clinic followup to TTM

## 2011-03-25 ENCOUNTER — Encounter: Payer: Self-pay | Admitting: Cardiology

## 2011-04-09 ENCOUNTER — Encounter: Payer: Self-pay | Admitting: Cardiology

## 2011-04-10 ENCOUNTER — Encounter: Payer: Self-pay | Admitting: Cardiology

## 2011-04-10 ENCOUNTER — Ambulatory Visit (INDEPENDENT_AMBULATORY_CARE_PROVIDER_SITE_OTHER): Payer: Medicare Other | Admitting: Cardiology

## 2011-04-10 DIAGNOSIS — I442 Atrioventricular block, complete: Secondary | ICD-10-CM

## 2011-04-10 DIAGNOSIS — I10 Essential (primary) hypertension: Secondary | ICD-10-CM

## 2011-04-10 NOTE — Progress Notes (Signed)
   HPI The patient presents for followup of her complete heart block status post pacemaker placement. She has routine followup over the telephone and in our clinic and is up-to-date with normal function. Since she was last here she's had no new complaints. She is recovering from bronchitis. She denies any new shortness of breath, PND or orthopnea. She has had no chest pressure, neck or arm discomfort. She has had no weight gain or edema. She's had no palpitations, presyncope or syncope.  No Known Allergies  Current Outpatient Prescriptions  Medication Sig Dispense Refill  . ALPRAZolam (XANAX) 0.25 MG tablet Take 0.25 mg by mouth 2 (two) times daily as needed.        Marland Kitchen aspirin 81 MG EC tablet Take 81 mg by mouth daily.        Marland Kitchen diltiazem (DILACOR XR) 240 MG 24 hr capsule Take 1 capsule (240 mg total) by mouth daily.  30 capsule  5  . hydrochlorothiazide 25 MG tablet Take 12.5 mg by mouth daily.        Marland Kitchen levothyroxine (SYNTHROID, LEVOTHROID) 75 MCG tablet Take 37.5 mcg by mouth daily.        Marland Kitchen loratadine (CLARITIN) 10 MG tablet Take 10 mg by mouth daily as needed.        . metoprolol (TOPROL-XL) 50 MG 24 hr tablet Take 50 mg by mouth daily.        . multivitamin (THERAGRAN) per tablet Take 1 tablet by mouth daily.        . simvastatin (ZOCOR) 40 MG tablet Take 40 mg by mouth at bedtime.          Past Medical History  Diagnosis Date  . Hyperlipidemia   . Hypertension   . Atrioventricular block, complete 2006    Past Surgical History  Procedure Date  . Pulse generator implant     Guidant Ultra dual-mode, dual-pacing, dual-sensing generator for complete atrioventricular block 2006 with some noise on atrial lead and generator recall for very low incidence of loss of output  . Normal coronary angiography 1999   ROS:  As stated in the HPI and negative for all other systems.  PHYSICAL EXAM BP 128/66  Pulse 60  Ht 5\' 1"  (1.549 m)  Wt 141 lb (63.957 kg)  BMI 26.64 kg/m2 GENERAL:  Well  appearing HEENT:  Pupils equal round and reactive, fundi not visualized, oral mucosa unremarkable NECK:  No jugular venous distention, waveform within normal limits, carotid upstroke brisk and symmetric, no bruits, no thyromegaly LUNGS:  Clear to auscultation bilaterally BACK:  No CVA tenderness CHEST:  Well healed pacer pocket HEART:  PMI not displaced or sustained,S1 and S2 within normal limits, no S3, no S4, no clicks, no rubs, no murmurs ABD:  Flat, positive bowel sounds normal in frequency in pitch, no bruits, no rebound, no guarding, no midline pulsatile mass, no hepatomegaly, no splenomegaly EXT:  2 plus pulses throughout, no edema, no cyanosis no clubbing SKIN:  No rashes no nodules  ASSESSMENT AND PLAN

## 2011-04-10 NOTE — Assessment & Plan Note (Signed)
She has normal pacemaker function. No change in therapy is indicated.

## 2011-04-10 NOTE — Assessment & Plan Note (Signed)
Her blood pressure is well controlled. She will continue the medications as listed. 

## 2011-04-10 NOTE — Patient Instructions (Signed)
The current medical regimen is effective;  continue present plan and medications.  Follow up in 1 year with Dr Hochrein.  You will receive a letter in the mail 2 months before you are due.  Please call us when you receive this letter to schedule your follow up appointment.  

## 2011-05-29 ENCOUNTER — Encounter: Payer: Self-pay | Admitting: Cardiology

## 2011-05-29 DIAGNOSIS — I495 Sick sinus syndrome: Secondary | ICD-10-CM

## 2011-08-28 ENCOUNTER — Encounter: Payer: Self-pay | Admitting: Cardiology

## 2011-08-28 DIAGNOSIS — I495 Sick sinus syndrome: Secondary | ICD-10-CM

## 2011-11-27 ENCOUNTER — Encounter: Payer: Self-pay | Admitting: Cardiology

## 2011-11-27 DIAGNOSIS — I495 Sick sinus syndrome: Secondary | ICD-10-CM

## 2012-02-26 DIAGNOSIS — I495 Sick sinus syndrome: Secondary | ICD-10-CM

## 2012-04-08 ENCOUNTER — Encounter: Payer: Self-pay | Admitting: Cardiology

## 2012-04-08 ENCOUNTER — Ambulatory Visit (INDEPENDENT_AMBULATORY_CARE_PROVIDER_SITE_OTHER): Payer: Medicare Other | Admitting: Cardiology

## 2012-04-08 VITALS — BP 138/72 | HR 60 | Ht 61.0 in | Wt 144.0 lb

## 2012-04-08 DIAGNOSIS — E785 Hyperlipidemia, unspecified: Secondary | ICD-10-CM

## 2012-04-08 DIAGNOSIS — Z950 Presence of cardiac pacemaker: Secondary | ICD-10-CM

## 2012-04-08 DIAGNOSIS — I442 Atrioventricular block, complete: Secondary | ICD-10-CM

## 2012-04-08 DIAGNOSIS — I10 Essential (primary) hypertension: Secondary | ICD-10-CM

## 2012-04-08 NOTE — Progress Notes (Signed)
   HPI The patient presents for followup of her complete heart block status post pacemaker placement. Since she was last here she's had no new complaints.  She denies any new shortness of breath, PND or orthopnea. She has had no chest pressure, neck or arm discomfort. She has had no weight gain or edema. She's had no palpitations, presyncope or syncope. She does her household chores.  No Known Allergies  Current Outpatient Prescriptions  Medication Sig Dispense Refill  . ALPRAZolam (XANAX) 0.25 MG tablet Take 0.25 mg by mouth 2 (two) times daily as needed.        Marland Kitchen aspirin 81 MG EC tablet Take 81 mg by mouth daily.        Marland Kitchen diltiazem (DILACOR XR) 240 MG 24 hr capsule Take 1 capsule (240 mg total) by mouth daily.  30 capsule  5  . hydrochlorothiazide 25 MG tablet Take 12.5 mg by mouth daily.        Marland Kitchen levothyroxine (SYNTHROID, LEVOTHROID) 75 MCG tablet Take 37.5 mcg by mouth daily.        Marland Kitchen loratadine (CLARITIN) 10 MG tablet Take 10 mg by mouth daily as needed.        . metoprolol (TOPROL-XL) 50 MG 24 hr tablet Take 50 mg by mouth daily.        . multivitamin (THERAGRAN) per tablet Take 1 tablet by mouth daily.        . simvastatin (ZOCOR) 40 MG tablet Take 40 mg by mouth at bedtime.          Past Medical History  Diagnosis Date  . Hyperlipidemia   . Hypertension   . Atrioventricular block, complete 2006    Past Surgical History  Procedure Date  . Pulse generator implant     Guidant Ultra dual-mode, dual-pacing, dual-sensing generator for complete atrioventricular block 2006 with some noise on atrial lead and generator recall for very low incidence of loss of output  . Normal coronary angiography 1999   ROS:  As stated in the HPI and negative for all other systems.  PHYSICAL EXAM BP 138/72  Pulse 60  Ht 5\' 1"  (1.549 m)  Wt 144 lb (65.318 kg)  BMI 27.21 kg/m2  SpO2 97% GENERAL:  Well appearing HEENT:  Pupils equal round and reactive, fundi not visualized, oral mucosa  unremarkable NECK:  No jugular venous distention, waveform within normal limits, carotid upstroke brisk and symmetric, no bruits, no thyromegaly LUNGS:  Clear to auscultation bilaterally BACK:  No CVA tenderness CHEST:  Well healed pacer pocket HEART:  PMI not displaced or sustained,S1 and S2 within normal limits, no S3, no S4, no clicks, no rubs, no murmurs ABD:  Flat, positive bowel sounds normal in frequency in pitch, no bruits, no rebound, no guarding, no midline pulsatile mass, no hepatomegaly, no splenomegaly EXT:  2 plus pulses throughout, no edema, no cyanosis no clubbing SKIN:  No rashes no nodules  EKG:  Atrial ventricular pacing. Rate 60.  ASSESSMENT AND PLAN  AV BLOCK, COMPLETE - She is due for pacemaker followup now and we will arrange that. She has had no new symptoms.  HYPERTENSION, BENIGN -  Her blood pressure is well controlled. She will continue the medications as listed.

## 2012-04-08 NOTE — Patient Instructions (Addendum)
The current medical regimen is effective;  continue present plan and medications.  Follow up in 1 year with Dr Hochrein.  You will receive a letter in the mail 2 months before you are due.  Please call us when you receive this letter to schedule your follow up appointment.  

## 2012-04-22 ENCOUNTER — Ambulatory Visit: Payer: Medicare Other | Admitting: Cardiology

## 2012-05-27 DIAGNOSIS — I495 Sick sinus syndrome: Secondary | ICD-10-CM

## 2012-05-29 ENCOUNTER — Encounter: Payer: Self-pay | Admitting: Cardiology

## 2012-05-29 ENCOUNTER — Other Ambulatory Visit: Payer: Self-pay | Admitting: Cardiology

## 2012-05-29 ENCOUNTER — Ambulatory Visit (INDEPENDENT_AMBULATORY_CARE_PROVIDER_SITE_OTHER): Payer: Medicare Other | Admitting: *Deleted

## 2012-05-29 DIAGNOSIS — I442 Atrioventricular block, complete: Secondary | ICD-10-CM

## 2012-05-29 LAB — PACEMAKER DEVICE OBSERVATION
AL AMPLITUDE: 1.7 mv
AL IMPEDENCE PM: 480 Ohm
ATRIAL PACING PM: 86
BAMS-0001: 170 {beats}/min
RV LEAD IMPEDENCE PM: 1360 Ohm
VENTRICULAR PACING PM: 99

## 2012-05-29 NOTE — Progress Notes (Signed)
Pacer check in clinic  

## 2012-08-26 ENCOUNTER — Encounter: Payer: Self-pay | Admitting: Cardiology

## 2012-08-26 DIAGNOSIS — I495 Sick sinus syndrome: Secondary | ICD-10-CM

## 2012-11-25 ENCOUNTER — Encounter: Payer: Self-pay | Admitting: Cardiology

## 2012-11-25 DIAGNOSIS — I495 Sick sinus syndrome: Secondary | ICD-10-CM

## 2012-12-02 ENCOUNTER — Ambulatory Visit (INDEPENDENT_AMBULATORY_CARE_PROVIDER_SITE_OTHER): Payer: Medicare Other | Admitting: *Deleted

## 2012-12-02 DIAGNOSIS — I442 Atrioventricular block, complete: Secondary | ICD-10-CM

## 2012-12-02 LAB — PACEMAKER DEVICE OBSERVATION
AL THRESHOLD: 0.5 V
ATRIAL PACING PM: 88
BAMS-0002: 8 ms
BAMS-0003: 70 {beats}/min
DEVICE MODEL PM: 120475

## 2012-12-02 NOTE — Progress Notes (Signed)
Pacemaker check in clinic. Normal device function. Battery longevity 3.5 years. Pacer dependent. ROV in January with GT.

## 2013-02-24 ENCOUNTER — Encounter: Payer: Self-pay | Admitting: Cardiology

## 2013-02-24 DIAGNOSIS — I495 Sick sinus syndrome: Secondary | ICD-10-CM

## 2013-04-13 ENCOUNTER — Encounter: Payer: Self-pay | Admitting: *Deleted

## 2013-04-15 ENCOUNTER — Encounter: Payer: Self-pay | Admitting: Cardiology

## 2013-05-26 ENCOUNTER — Encounter: Payer: Self-pay | Admitting: Cardiology

## 2013-05-26 DIAGNOSIS — I495 Sick sinus syndrome: Secondary | ICD-10-CM

## 2013-06-09 ENCOUNTER — Ambulatory Visit (INDEPENDENT_AMBULATORY_CARE_PROVIDER_SITE_OTHER): Payer: Medicare Other | Admitting: *Deleted

## 2013-06-09 DIAGNOSIS — I442 Atrioventricular block, complete: Secondary | ICD-10-CM

## 2013-06-09 LAB — MDC_IDC_ENUM_SESS_TYPE_INCLINIC
Brady Statistic RV Percent Paced: 100 %
Date Time Interrogation Session: 20150311040000
Implantable Pulse Generator Model: 1291
Implantable Pulse Generator Serial Number: 120475
Lead Channel Impedance Value: 1460 Ohm
Lead Channel Pacing Threshold Amplitude: 0.9 V
Lead Channel Pacing Threshold Amplitude: 0.9 V
Lead Channel Sensing Intrinsic Amplitude: 1.9 mV
Lead Channel Setting Pacing Amplitude: 2.4 V
Lead Channel Setting Pacing Pulse Width: 0.4 ms
MDC IDC MSMT LEADCHNL RA IMPEDANCE VALUE: 490 Ohm
MDC IDC MSMT LEADCHNL RA PACING THRESHOLD PULSEWIDTH: 0.4 ms
MDC IDC MSMT LEADCHNL RV PACING THRESHOLD PULSEWIDTH: 0.4 ms
MDC IDC SET LEADCHNL RA PACING AMPLITUDE: 2 V
MDC IDC SET LEADCHNL RV SENSING SENSITIVITY: 2.5 mV
MDC IDC STAT BRADY RA PERCENT PACED: 70 %

## 2013-06-09 NOTE — Progress Notes (Signed)
Pacemaker check in clinic. Normal device function. Battery longevity 3.5 years. 20 mode switches--total time 8.7 minutes. ROV in 6 mths w/[place] in [place].

## 2013-06-16 ENCOUNTER — Ambulatory Visit (INDEPENDENT_AMBULATORY_CARE_PROVIDER_SITE_OTHER): Payer: Medicare Other | Admitting: Cardiology

## 2013-06-16 ENCOUNTER — Encounter: Payer: Self-pay | Admitting: Cardiology

## 2013-06-16 VITALS — BP 150/83 | HR 75 | Ht 61.0 in | Wt 144.0 lb

## 2013-06-16 DIAGNOSIS — I442 Atrioventricular block, complete: Secondary | ICD-10-CM

## 2013-06-16 NOTE — Patient Instructions (Signed)
The current medical regimen is effective;  continue present plan and medications.  Follow up in 1 year with Dr Hochrein.  You will receive a letter in the mail 2 months before you are due.  Please call us when you receive this letter to schedule your follow up appointment.  

## 2013-06-16 NOTE — Progress Notes (Signed)
    HPI The patient presents for followup of her complete heart block status post pacemaker placement. Since she was last here she's had no new complaints.  She denies any new shortness of breath, PND or orthopnea. She has had no chest pressure, neck or arm discomfort. She has had no weight gain or edema. She's had no  presyncope or syncope. She does her household chores and yard work.  She did have some mild palpitations recently but she thought this might have been nerves.  Of note her primary physician recently stopped her beta blocker because her blood pressure was running low..  No Known Allergies  Current Outpatient Prescriptions  Medication Sig Dispense Refill  . ALPRAZolam (XANAX) 0.25 MG tablet Take 0.25 mg by mouth 2 (two) times daily as needed.        Marland Kitchen. aspirin 81 MG EC tablet Take 81 mg by mouth daily.        Marland Kitchen. diltiazem (DILACOR XR) 240 MG 24 hr capsule Take 1 capsule (240 mg total) by mouth daily.  30 capsule  5  . hydrochlorothiazide 25 MG tablet Take 12.5 mg by mouth daily.        Marland Kitchen. levothyroxine (SYNTHROID, LEVOTHROID) 75 MCG tablet Take 37.5 mcg by mouth daily.        Marland Kitchen. loratadine (CLARITIN) 10 MG tablet Take 10 mg by mouth daily as needed.        . multivitamin (THERAGRAN) per tablet Take 1 tablet by mouth daily.        . simvastatin (ZOCOR) 40 MG tablet Take 40 mg by mouth at bedtime.         No current facility-administered medications for this visit.    Past Medical History  Diagnosis Date  . Hyperlipidemia   . Hypertension   . Atrioventricular block, complete 2006    Past Surgical History  Procedure Laterality Date  . Pulse generator implant      Guidant Ultra dual-mode, dual-pacing, dual-sensing generator for complete atrioventricular block 2006 with some noise on atrial lead and generator recall for very low incidence of loss of output  . Normal coronary angiography  1999   ROS:  As stated in the HPI and negative for all other systems.  PHYSICAL EXAM BP  150/83  Pulse 75  Ht 5\' 1"  (1.549 m)  Wt 144 lb (65.318 kg)  BMI 27.22 kg/m2 GENERAL:  Well appearing and looking younger than stated age.  HEENT:  Pupils equal round and reactive, fundi not visualized, oral mucosa unremarkable NECK:  No jugular venous distention, waveform within normal limits, carotid upstroke brisk and symmetric, no bruits, no thyromegaly LUNGS:  Clear to auscultation bilaterally BACK:  No CVA tenderness CHEST:  Well healed pacer pocket HEART:  PMI not displaced or sustained,S1 and S2 within normal limits, no S3, no S4, no clicks, no rubs, no murmurs ABD:  Flat, positive bowel sounds normal in frequency in pitch, no bruits, no rebound, no guarding, no midline pulsatile mass, no hepatomegaly, no splenomegaly EXT:  2 plus pulses throughout, no edema, no cyanosis no clubbing SKIN:  No rashes no nodules   ASSESSMENT AND PLAN  AV BLOCK, COMPLETE - She is up to date for pacemaker follow up.  No change in therapy is indicated.   HYPERTENSION, BENIGN -  Her blood pressure is slightly up today.  However, given the fact that it has ben running low I will not make any further changes to her meds.

## 2013-08-25 DIAGNOSIS — I495 Sick sinus syndrome: Secondary | ICD-10-CM

## 2013-11-24 ENCOUNTER — Encounter: Payer: Self-pay | Admitting: Internal Medicine

## 2013-11-24 DIAGNOSIS — I495 Sick sinus syndrome: Secondary | ICD-10-CM

## 2013-11-25 ENCOUNTER — Encounter: Payer: Self-pay | Admitting: Internal Medicine

## 2013-12-16 ENCOUNTER — Ambulatory Visit (INDEPENDENT_AMBULATORY_CARE_PROVIDER_SITE_OTHER): Payer: Medicare Other | Admitting: *Deleted

## 2013-12-16 DIAGNOSIS — I442 Atrioventricular block, complete: Secondary | ICD-10-CM

## 2013-12-16 LAB — MDC_IDC_ENUM_SESS_TYPE_INCLINIC
Date Time Interrogation Session: 20150917040000
Lead Channel Impedance Value: 1360 Ohm
Lead Channel Impedance Value: 480 Ohm
Lead Channel Pacing Threshold Amplitude: 0.8 V
Lead Channel Pacing Threshold Pulse Width: 0.4 ms
Lead Channel Pacing Threshold Pulse Width: 0.4 ms
Lead Channel Sensing Intrinsic Amplitude: 1.9 mV
Lead Channel Setting Pacing Amplitude: 2.4 V
Lead Channel Setting Sensing Sensitivity: 2.5 mV
MDC IDC MSMT LEADCHNL RV PACING THRESHOLD AMPLITUDE: 0.9 V
MDC IDC PG SERIAL: 120475
MDC IDC SET LEADCHNL RA PACING AMPLITUDE: 2 V
MDC IDC SET LEADCHNL RV PACING PULSEWIDTH: 0.4 ms
MDC IDC STAT BRADY RA PERCENT PACED: 48 %
MDC IDC STAT BRADY RV PERCENT PACED: 100 %

## 2013-12-16 NOTE — Progress Notes (Signed)
Pacemaker check in clinic. Battery longevity 3 years. Normal device function. 93 mode switch episodes--total time 8.9 hours. No changes made. ROV in 6 mths w/device clinic.

## 2014-02-23 ENCOUNTER — Encounter: Payer: Self-pay | Admitting: Internal Medicine

## 2014-02-23 DIAGNOSIS — I495 Sick sinus syndrome: Secondary | ICD-10-CM

## 2014-04-01 DIAGNOSIS — I480 Paroxysmal atrial fibrillation: Secondary | ICD-10-CM

## 2014-04-01 HISTORY — DX: Paroxysmal atrial fibrillation: I48.0

## 2014-05-25 ENCOUNTER — Encounter: Payer: Self-pay | Admitting: Internal Medicine

## 2014-05-25 DIAGNOSIS — R001 Bradycardia, unspecified: Secondary | ICD-10-CM | POA: Diagnosis not present

## 2014-06-17 ENCOUNTER — Ambulatory Visit (INDEPENDENT_AMBULATORY_CARE_PROVIDER_SITE_OTHER): Payer: Medicare Other | Admitting: *Deleted

## 2014-06-17 DIAGNOSIS — I442 Atrioventricular block, complete: Secondary | ICD-10-CM

## 2014-06-17 LAB — MDC_IDC_ENUM_SESS_TYPE_INCLINIC
Brady Statistic RA Percent Paced: 36 %
Date Time Interrogation Session: 20160318040000
Implantable Pulse Generator Serial Number: 120475
Lead Channel Impedance Value: 1310 Ohm
Lead Channel Impedance Value: 440 Ohm
Lead Channel Pacing Threshold Amplitude: 0.8 V
Lead Channel Sensing Intrinsic Amplitude: 1.6 mV
Lead Channel Setting Pacing Amplitude: 2.4 V
Lead Channel Setting Sensing Sensitivity: 2.5 mV
MDC IDC MSMT LEADCHNL RA PACING THRESHOLD PULSEWIDTH: 0.4 ms
MDC IDC MSMT LEADCHNL RV PACING THRESHOLD AMPLITUDE: 1 V
MDC IDC MSMT LEADCHNL RV PACING THRESHOLD PULSEWIDTH: 0.4 ms
MDC IDC SET LEADCHNL RA PACING AMPLITUDE: 2 V
MDC IDC SET LEADCHNL RV PACING PULSEWIDTH: 0.4 ms
MDC IDC STAT BRADY RV PERCENT PACED: 100 %

## 2014-06-17 NOTE — Progress Notes (Signed)
Normal pacer check Battery longevity 2.5 years 1 mode switch lasting 6 seconds--noise on atrial lead No changes made ROV 01-06-15 @ 1015 with JA/[place].

## 2014-06-29 ENCOUNTER — Ambulatory Visit (INDEPENDENT_AMBULATORY_CARE_PROVIDER_SITE_OTHER): Payer: Medicare Other | Admitting: Cardiology

## 2014-06-29 ENCOUNTER — Encounter: Payer: Self-pay | Admitting: Cardiology

## 2014-06-29 VITALS — BP 122/68 | HR 65 | Ht 61.0 in | Wt 142.0 lb

## 2014-06-29 DIAGNOSIS — I442 Atrioventricular block, complete: Secondary | ICD-10-CM

## 2014-06-29 DIAGNOSIS — I10 Essential (primary) hypertension: Secondary | ICD-10-CM

## 2014-06-29 NOTE — Patient Instructions (Signed)
The current medical regimen is effective;  continue present plan and medications.  Follow up as needed.  Thank you for choosing [facility]!!     

## 2014-06-29 NOTE — Progress Notes (Signed)
    HPI The patient presents for followup of her complete heart block status post pacemaker placement. Since she was last here she's had no new complaints.  She denies any new shortness of breath, PND or orthopnea. She has had no chest pressure, neck or arm discomfort. She has had no weight gain or edema. She's had no  presyncope or syncope. She does her household chores and is now back in the yard working.   No Known Allergies  Current Outpatient Prescriptions  Medication Sig Dispense Refill  . ALPRAZolam (XANAX) 0.25 MG tablet Take 0.25 mg by mouth at bedtime as needed for anxiety.    Marland Kitchen. aspirin 81 MG EC tablet Take 81 mg by mouth daily.      Marland Kitchen. diltiazem (CARDIZEM) 120 MG tablet Take 120 mg by mouth daily.    . hydrochlorothiazide 25 MG tablet Take 12.5 mg by mouth daily.      Marland Kitchen. levocetirizine (XYZAL) 5 MG tablet Take 5 mg by mouth daily.    Marland Kitchen. levothyroxine (SYNTHROID, LEVOTHROID) 75 MCG tablet Take 37.5 mcg by mouth daily.      . multivitamin (THERAGRAN) per tablet Take 1 tablet by mouth daily.      . simvastatin (ZOCOR) 40 MG tablet Take 40 mg by mouth at bedtime.       No current facility-administered medications for this visit.    Past Medical History  Diagnosis Date  . Hyperlipidemia   . Hypertension   . Atrioventricular block, complete 2006    Past Surgical History  Procedure Laterality Date  . Pulse generator implant      Guidant Ultra dual-mode, dual-pacing, dual-sensing generator for complete atrioventricular block 2006 with some noise on atrial lead and generator recall for very low incidence of loss of output  . Normal coronary angiography  1999   ROS:  As stated in the HPI and negative for all other systems.  PHYSICAL EXAM BP 122/68 mmHg  Pulse 65  Ht 5\' 1"  (1.549 m)  Wt 142 lb (64.411 kg)  BMI 26.84 kg/m2 GENERAL:  Well appearing and looking younger than stated age.  NECK:  No jugular venous distention, waveform within normal limits, carotid upstroke brisk and  symmetric, no bruits, no thyromegaly LUNGS:  Clear to auscultation bilaterally BACK:  No CVA tenderness CHEST:  Well healed pacer pocket HEART:  PMI not displaced or sustained,S1 and S2 within normal limits, no S3, no S4, no clicks, no rubs, no murmurs ABD:  Flat, positive bowel sounds normal in frequency in pitch, no bruits, no rebound, no guarding, no midline pulsatile mass, no hepatomegaly, no splenomegaly EXT:  2 plus pulses throughout, no edema, no cyanosis no clubbing  EKG:  AV paced 100% rate 65.  06/29/2014   ASSESSMENT AND PLAN  AV BLOCK, COMPLETE - She is up to date for pacemaker follow up.  No change in therapy is indicated.   She will follow with Dr. Johney FrameAllred  HYPERTENSION, BENIGN -  The blood pressure is at target. No change in medications is indicated. We will continue with therapeutic lifestyle changes (TLC).

## 2014-08-24 DIAGNOSIS — I495 Sick sinus syndrome: Secondary | ICD-10-CM | POA: Diagnosis not present

## 2014-11-23 DIAGNOSIS — I495 Sick sinus syndrome: Secondary | ICD-10-CM | POA: Diagnosis not present

## 2015-01-06 ENCOUNTER — Encounter: Payer: Medicare Other | Admitting: Internal Medicine

## 2015-01-06 ENCOUNTER — Ambulatory Visit (INDEPENDENT_AMBULATORY_CARE_PROVIDER_SITE_OTHER): Payer: Medicare Other | Admitting: Internal Medicine

## 2015-01-06 ENCOUNTER — Encounter: Payer: Self-pay | Admitting: Internal Medicine

## 2015-01-06 VITALS — BP 134/73 | HR 64 | Ht 61.0 in | Wt 143.8 lb

## 2015-01-06 DIAGNOSIS — Z950 Presence of cardiac pacemaker: Secondary | ICD-10-CM

## 2015-01-06 DIAGNOSIS — I442 Atrioventricular block, complete: Secondary | ICD-10-CM

## 2015-01-06 DIAGNOSIS — I480 Paroxysmal atrial fibrillation: Secondary | ICD-10-CM

## 2015-01-06 DIAGNOSIS — I4891 Unspecified atrial fibrillation: Secondary | ICD-10-CM | POA: Insufficient documentation

## 2015-01-06 DIAGNOSIS — I10 Essential (primary) hypertension: Secondary | ICD-10-CM | POA: Diagnosis not present

## 2015-01-06 LAB — CUP PACEART INCLINIC DEVICE CHECK
Brady Statistic RV Percent Paced: 100 %
Date Time Interrogation Session: 20161007040000
Lead Channel Impedance Value: 440 Ohm
Lead Channel Pacing Threshold Pulse Width: 0.4 ms
Lead Channel Setting Pacing Amplitude: 2 V
Lead Channel Setting Pacing Amplitude: 2.4 V
Lead Channel Setting Sensing Sensitivity: 2.5 mV
MDC IDC MSMT LEADCHNL RA PACING THRESHOLD AMPLITUDE: 0.7 V
MDC IDC MSMT LEADCHNL RA PACING THRESHOLD PULSEWIDTH: 0.4 ms
MDC IDC MSMT LEADCHNL RA SENSING INTR AMPL: 1.6 mV
MDC IDC MSMT LEADCHNL RV IMPEDANCE VALUE: 1570 Ohm
MDC IDC MSMT LEADCHNL RV PACING THRESHOLD AMPLITUDE: 1 V
MDC IDC PG SERIAL: 120475
MDC IDC SET LEADCHNL RV PACING PULSEWIDTH: 0.4 ms
MDC IDC STAT BRADY RA PERCENT PACED: 39 %

## 2015-01-06 NOTE — Patient Instructions (Signed)
Your physician recommends that you continue on your current medications as directed. Please refer to the Current Medication list given to you today. Your physician recommends that you schedule a follow-up appointment in: 6 months in the device clinic. You will receive a reminder letter in the mail in about 4 months reminding you to call and schedule your appointment. If you don't receive this letter, please contact our office. You may also schedule this appointment today. Your physician recommends that you schedule a follow-up appointment in: 1 year with Dr. Johney Frame. You will receive a reminder letter in the mail in about 10 months reminding you to call and schedule your appointment. If you don't receive this letter, please contact our office. You may also schedule this appointment today.

## 2015-01-06 NOTE — Progress Notes (Addendum)
Electrophysiology Office Note   Date:  01/06/2015   ID:  Marzetta Board Fredlund, DOB [birth year], MRN [id number]  PCP:  Colon Branch, MD  Cardiologist:  Dr Antoine Poche Primary Electrophysiologist: Hillis Range, MD    CC: SOB   History of Present Illness: [name] [name] is a 81 y.o. female who presents today to establish with EP.  She is s/p prior PPM 1999.  She had generator change by Dr Juanda Chance 2006.  She was followed by Dr Juanda Chance until his retirement and has since been seen by Dr Antoine Poche.  She has done well.  Remains active for her age.  Today, she denies symptoms of palpitations, chest pain, shortness of breath, orthopnea, PND, lower extremity edema, claudication, dizziness, presyncope, syncope, bleeding, or neurologic sequela. The patient is tolerating medications without difficulties and is otherwise without complaint today.    Past Medical History  Diagnosis Date  . Hyperlipidemia   . Hypertension   . Atrioventricular block, complete (HCC) 2006  . Paroxysmal atrial fibrillation (HCC) 2016    single episode detected on PPM interrogation   Past Surgical History  Procedure Laterality Date  . Pulse generator implant      Guidant Ultra dual-mode, dual-pacing, dual-sensing generator for complete atrioventricular block 2006 with some noise on atrial lead and generator recall for very low incidence of loss of output  . Normal coronary angiography  1999     Current Outpatient Prescriptions  Medication Sig Dispense Refill  . ALPRAZolam (XANAX) 0.25 MG tablet Take 0.25 mg by mouth at bedtime as needed for anxiety.    Marland Kitchen aspirin 81 MG EC tablet Take 81 mg by mouth daily.      Marland Kitchen diltiazem (CARDIZEM) 120 MG tablet Take 120 mg by mouth daily.    . hydrochlorothiazide 25 MG tablet Take 12.5 mg by mouth daily.      Marland Kitchen levocetirizine (XYZAL) 5 MG tablet Take 5 mg by mouth as needed.     Marland Kitchen levothyroxine (SYNTHROID, LEVOTHROID) 75 MCG tablet Take 37.5 mcg by mouth daily.      . multivitamin  (THERAGRAN) per tablet Take 1 tablet by mouth daily.      . simvastatin (ZOCOR) 40 MG tablet Take 40 mg by mouth at bedtime.       No current facility-administered medications for this visit.    Allergies:   Review of patient's allergies indicates no known allergies.   Social History:  The patient  reports that she has never smoked. She does not have any smokeless tobacco history on file.   Family History:  The patient is unaware of any pertinent family history.  ROS:  Please see the history of present illness.   All other systems are reviewed and negative.   PHYSICAL EXAM: VS:  BP 134/73 mmHg  Pulse 64  Ht  (1.549 m)  Wt 143 lb 12.8 oz (65.227 kg)  BMI 27.18 kg/m2  SpO2 96% , BMI Body mass index is 27.18 kg/(m^2). GEN: elderly, in no acute distress HEENT: normal Neck: no JVD, carotid bruits, or masses Cardiac: RRR; 2/6 SEM LUSB Respiratory:  clear to auscultation bilaterally, normal work of breathing GI: soft, nontender, nondistended, + BS MS: no deformity or atrophy Skin: warm and dry, device pocket is well healed Neuro:  Strength and sensation are intact Psych: euthymic mood, full affect  Device interrogation is reviewed today in detail.  See PaceArt for details.   Wt Readings from Last 3 Encounters:  01/06/15 143 lb 12.8 oz (65.227  kg)  06/29/14 142 lb (64.411 kg)  06/16/13 144 lb (65.318 kg)      Other studies Reviewed: Additional studies/ records that were reviewed today include: Dr Hochrein's notes    ASSESSMENT AND PLAN:  1.  Complete heart block Normal pacemaker function See Pace Art report No changes today  2. afib New diagnosis, discovered on PPM interrogation She had 227 mode switches totally 24 hours (<1%) afib burden. This patients CHA2DS2-VASc Score and unadjusted Ischemic Stroke Rate (% per year) is equal to 4.8 % stroke rate/year from a score of 4 Today, I discussed Coumadin as well as novel anticoagulants including Pradaxa, Xarelto,  Savaysa, and Eliquis today as indicated for risk reduction in stroke and systemic emboli with nonvalvular atrial fibrillation.  Risks, benefits, and alternatives to each of these drugs were discussed at length today.  At this time, she is clear that she would prefer to continue ASA.  She may be more willing to consider anticoagulation if more afib occurs.  3. HTn Stable No change required today  Follow-up: TTMs every 3 months, return to the device clinic in 6 months,  I will see again in 1 year unless problems arise.  Current medicines are reviewed at length with the patient today.   The patient does not have concerns regarding her medicines.  The following changes were made today:  none  Signed, Hillis Range, MD  01/06/2015 5:05 PM     [facility] [address] [place] [address] [phone] (office) [phone] (fax)

## 2015-01-13 ENCOUNTER — Encounter: Payer: Medicare Other | Admitting: Internal Medicine

## 2015-01-23 ENCOUNTER — Encounter: Payer: Self-pay | Admitting: Internal Medicine

## 2015-02-22 DIAGNOSIS — I495 Sick sinus syndrome: Secondary | ICD-10-CM | POA: Diagnosis not present

## 2015-05-24 DIAGNOSIS — I495 Sick sinus syndrome: Secondary | ICD-10-CM | POA: Diagnosis not present

## 2015-07-07 ENCOUNTER — Ambulatory Visit: Payer: Medicare Other | Admitting: Internal Medicine

## 2015-08-04 ENCOUNTER — Ambulatory Visit (INDEPENDENT_AMBULATORY_CARE_PROVIDER_SITE_OTHER): Payer: Medicare Other | Admitting: *Deleted

## 2015-08-04 ENCOUNTER — Encounter: Payer: Self-pay | Admitting: Internal Medicine

## 2015-08-04 DIAGNOSIS — Z950 Presence of cardiac pacemaker: Secondary | ICD-10-CM | POA: Diagnosis not present

## 2015-08-04 DIAGNOSIS — I442 Atrioventricular block, complete: Secondary | ICD-10-CM

## 2015-08-04 LAB — CUP PACEART INCLINIC DEVICE CHECK
Brady Statistic RA Percent Paced: 47 %
Date Time Interrogation Session: 20170505040000
Implantable Lead Implant Date: 19980323
Implantable Lead Location: 753860
Lead Channel Impedance Value: 1400 Ohm
Lead Channel Impedance Value: 440 Ohm
Lead Channel Sensing Intrinsic Amplitude: 1.5 mV
Lead Channel Setting Pacing Amplitude: 2 V
Lead Channel Setting Pacing Amplitude: 2.4 V
Lead Channel Setting Pacing Pulse Width: 0.4 ms
MDC IDC LEAD IMPLANT DT: 19980323
MDC IDC LEAD LOCATION: 753859
MDC IDC MSMT LEADCHNL RA PACING THRESHOLD AMPLITUDE: 0.8 V
MDC IDC MSMT LEADCHNL RA PACING THRESHOLD PULSEWIDTH: 0.4 ms
MDC IDC MSMT LEADCHNL RV PACING THRESHOLD AMPLITUDE: 1.2 V
MDC IDC MSMT LEADCHNL RV PACING THRESHOLD PULSEWIDTH: 0.4 ms
MDC IDC SET LEADCHNL RV SENSING SENSITIVITY: 2.5 mV
MDC IDC STAT BRADY RV PERCENT PACED: 100 %
Pulse Gen Model: 1291
Pulse Gen Serial Number: 120475

## 2015-08-04 NOTE — Progress Notes (Signed)
Pacemaker check in clinic. Normal device function. Thresholds, sensing, impedances consistent with previous measurements. Device programmed to maximize longevity. 659 ATR episodes- 1%- longest 9 mins, EGMs show A lead noise. No high ventricular rates noted. Device programmed at appropriate safety margins. Histogram distribution appropriate for patient activity level. Device programmed to optimize intrinsic conduction. Estimated longevity 2 years. ROV with JA in 12 months.

## 2015-08-23 ENCOUNTER — Encounter: Payer: Self-pay | Admitting: Internal Medicine

## 2015-11-22 ENCOUNTER — Encounter: Payer: Self-pay | Admitting: Internal Medicine

## 2015-11-22 DIAGNOSIS — I495 Sick sinus syndrome: Secondary | ICD-10-CM

## 2015-12-08 ENCOUNTER — Ambulatory Visit: Payer: Self-pay | Admitting: Family

## 2015-12-29 ENCOUNTER — Encounter: Payer: Self-pay | Admitting: Internal Medicine

## 2015-12-29 ENCOUNTER — Ambulatory Visit (INDEPENDENT_AMBULATORY_CARE_PROVIDER_SITE_OTHER): Payer: Medicare Other | Admitting: Internal Medicine

## 2015-12-29 VITALS — BP 131/73 | HR 73 | Ht 61.0 in | Wt 143.8 lb

## 2015-12-29 DIAGNOSIS — I480 Paroxysmal atrial fibrillation: Secondary | ICD-10-CM | POA: Diagnosis not present

## 2015-12-29 DIAGNOSIS — Z950 Presence of cardiac pacemaker: Secondary | ICD-10-CM | POA: Diagnosis not present

## 2015-12-29 DIAGNOSIS — I4891 Unspecified atrial fibrillation: Secondary | ICD-10-CM

## 2015-12-29 DIAGNOSIS — I10 Essential (primary) hypertension: Secondary | ICD-10-CM

## 2015-12-29 DIAGNOSIS — I442 Atrioventricular block, complete: Secondary | ICD-10-CM | POA: Diagnosis not present

## 2015-12-29 LAB — CUP PACEART INCLINIC DEVICE CHECK
Brady Statistic RV Percent Paced: 100 %
Implantable Lead Implant Date: 19980323
Implantable Lead Location: 753859
Implantable Lead Location: 753860
Implantable Lead Model: 5034
Lead Channel Impedance Value: 1460 Ohm
Lead Channel Impedance Value: 440 Ohm
Lead Channel Pacing Threshold Pulse Width: 0.4 ms
Lead Channel Sensing Intrinsic Amplitude: 2 mV
Lead Channel Setting Pacing Amplitude: 2.4 V
MDC IDC LEAD IMPLANT DT: 19980323
MDC IDC MSMT LEADCHNL RA PACING THRESHOLD AMPLITUDE: 0.8 V
MDC IDC MSMT LEADCHNL RV PACING THRESHOLD AMPLITUDE: 1 V
MDC IDC MSMT LEADCHNL RV PACING THRESHOLD PULSEWIDTH: 0.4 ms
MDC IDC SESS DTM: 20170929040000
MDC IDC SET LEADCHNL RA PACING AMPLITUDE: 2 V
MDC IDC SET LEADCHNL RV PACING PULSEWIDTH: 0.4 ms
MDC IDC SET LEADCHNL RV SENSING SENSITIVITY: 2.5 mV
MDC IDC STAT BRADY RA PERCENT PACED: 39 %
Pulse Gen Model: 1291
Pulse Gen Serial Number: 120475

## 2015-12-29 NOTE — Progress Notes (Signed)
Electrophysiology Office Note   Date:  12/29/2015   ID:  [name] [name], DOB Nov 26, 1924, MRN [id number]  PCP:  Colon Branch, MD  Cardiologist:  Dr Antoine Poche Primary Electrophysiologist: Hillis Range, MD    CC: pacemaker follow-up   History of Present Illness: [name] [name] is a 82 y.o. female who presents today to follow-up with EP.  She is s/p prior PPM 1999.  She had generator change by Dr Juanda Chance 2006.   She has done well.  Remains active for her age.  She did yard work yesterday. Today, she denies symptoms of palpitations, chest pain, shortness of breath, orthopnea, PND, lower extremity edema, claudication, dizziness, presyncope, syncope, bleeding, or neurologic sequela. The patient is tolerating medications without difficulties and is otherwise without complaint today.    Past Medical History:  Diagnosis Date  . Atrioventricular block, complete (HCC) 2006  . Hyperlipidemia   . Hypertension   . Paroxysmal atrial fibrillation (HCC) 2016   single episode detected on PPM interrogation   Past Surgical History:  Procedure Laterality Date  . Normal coronary angiography  1999  . PULSE GENERATOR IMPLANT     Guidant Ultra dual-mode, dual-pacing, dual-sensing generator for complete atrioventricular block 2006 with some noise on atrial lead and generator recall for very low incidence of loss of output     Current Outpatient Prescriptions  Medication Sig Dispense Refill  . aspirin 325 MG tablet Take 325 mg by mouth daily.    Marland Kitchen atorvastatin (LIPITOR) 10 MG tablet Take 10 mg by mouth every evening.    . diltiazem (CARDIZEM) 120 MG tablet Take 120 mg by mouth daily.    . ergocalciferol (VITAMIN D2) 50000 units capsule Take 50,000 Units by mouth once a week.    . hydrochlorothiazide (HYDRODIURIL) 12.5 MG tablet Take 12.5 mg by mouth daily.    Marland Kitchen levothyroxine (SYNTHROID, LEVOTHROID) 75 MCG tablet Take 37.5 mcg by mouth daily. Except 1 tab ( ) on Sunday.    . multivitamin  (THERAGRAN) per tablet Take 1 tablet by mouth daily.       No current facility-administered medications for this visit.     Allergies:   Review of patient's allergies indicates no known allergies.   Social History:  The patient  reports that she has never smoked. She has never used smokeless tobacco.   Family History:  The patient is unaware of any pertinent family history.  ROS:  Please see the history of present illness.   All other systems are reviewed and negative.   PHYSICAL EXAM: VS:  BP 131/73   Pulse 73   Ht 5\' 1"  (1.549 m)   Wt 143 lb 12.8 oz (65.2 kg)   SpO2 95%   BMI 27.17 kg/m  , BMI Body mass index is 27.17 kg/m. GEN: elderly, in no acute distress  HEENT: normal  Neck: no JVD, carotid bruits, or masses Cardiac: RRR; 2/6 SEM LUSB Respiratory:  clear to auscultation bilaterally, normal work of breathing GI: soft, nontender, nondistended, + BS MS: no deformity or atrophy  Skin: warm and dry, device pocket is well healed Neuro:  Strength and sensation are intact Psych: euthymic mood, full affect  Device interrogation is reviewed today in detail.  See PaceArt for details.   Wt Readings from Last 3 Encounters:  12/29/15 143 lb 12.8 oz (65.2 kg)  01/06/15 143 lb 12.8 oz (65.2 kg)  06/29/14 142 lb (64.4 kg)     ASSESSMENT AND PLAN:  1.  Complete heart block Normal  pacemaker function See Arita Missace Art report No changes today 1.5 years left on her device Occasional atrial lead noise noted  2. afib Well controlled Very low burden She declines anticoagulation  3. HTN Stable No change required today  Follow-up: TTMs every 3 months, return to the device clinic in 6 months,  I will see again in 1 year unless problems arise.  Randolm IdolSigned, Thoma Paulsen, MD  12/29/2015 10:46 AM     [facility] [address] [place] [address] [phone] (office) [phone] (fax)

## 2015-12-29 NOTE — Patient Instructions (Addendum)
Medication Instructions:  Continue all current medications.  Labwork: none  Testing/Procedures: none  Follow-Up: Your physician wants you to follow up in:  1 year.  You will receive a reminder letter in the mail one-two months in advance.  If you don't receive a letter, please call our office to schedule the follow up appointment - Dr. Johney FrameAllred.    Any Other Special Instructions Will Be Listed Below (If Applicable). Your physician wants you to follow up in: 6 months.  You will receive a reminder letter in the mail one-two months in advance.  If you don't receive a letter, please call our office to schedule the follow up appointment - device check in office.   If you need a refill on your cardiac medications before your next appointment, please call your pharmacy.

## 2016-01-05 ENCOUNTER — Encounter: Payer: Medicare Other | Admitting: Internal Medicine

## 2016-02-27 ENCOUNTER — Encounter: Payer: Self-pay | Admitting: Internal Medicine

## 2016-02-27 DIAGNOSIS — I495 Sick sinus syndrome: Secondary | ICD-10-CM | POA: Diagnosis not present

## 2016-05-03 ENCOUNTER — Ambulatory Visit: Payer: Self-pay | Admitting: Pediatrics

## 2016-05-16 ENCOUNTER — Ambulatory Visit (INDEPENDENT_AMBULATORY_CARE_PROVIDER_SITE_OTHER): Payer: Medicare Other | Admitting: Family Medicine

## 2016-05-16 ENCOUNTER — Encounter: Payer: Self-pay | Admitting: Family Medicine

## 2016-05-16 VITALS — BP 132/68 | HR 65 | Temp 98.0°F | Ht 61.0 in | Wt 145.6 lb

## 2016-05-16 DIAGNOSIS — E785 Hyperlipidemia, unspecified: Secondary | ICD-10-CM | POA: Diagnosis not present

## 2016-05-16 DIAGNOSIS — I4891 Unspecified atrial fibrillation: Secondary | ICD-10-CM | POA: Diagnosis not present

## 2016-05-16 DIAGNOSIS — E559 Vitamin D deficiency, unspecified: Secondary | ICD-10-CM | POA: Diagnosis not present

## 2016-05-16 DIAGNOSIS — I10 Essential (primary) hypertension: Secondary | ICD-10-CM

## 2016-05-16 MED ORDER — ATORVASTATIN CALCIUM 10 MG PO TABS: 10.0000 mg | ORAL_TABLET | Freq: Every evening | ORAL | 3 refills | Status: DC

## 2016-05-16 MED ORDER — HYDROCHLOROTHIAZIDE 12.5 MG PO TABS
12.5000 mg | ORAL_TABLET | Freq: Every day | ORAL | 3 refills | Status: DC
Start: 2016-05-16 — End: 2017-06-09

## 2016-05-16 NOTE — Patient Instructions (Signed)
Great to meet you!  Lets see you again in 4 months  We will send your labs on mychart or call within 1 week  Please let us know if you need anything sooner, we are always available for same-day or next day appointments if you are ill.

## 2016-05-16 NOTE — Progress Notes (Signed)
   HPI  Patient presents today here to establish care.  Patient is transferring care as her previous physician has retired.  She denies any complaints today.  She has good medication compliance and feels well overall. She denies any chest pain, dyspnea, palpitations, or leg edema.  She has a good appetite. She's reasonably active.   PMH: Complete heart block status post pacemaker placement, hyperlipidemia, hypertension, paroxysmal atrial fibrillation Surgical history significant for abdominal hysterectomy, cholecystectomy Family history significant for breast cancer and sister Social history significant for denies drug and alcohol use. ROS: Per HPI  Objective: BP 132/68   Pulse 65   Temp 98 F (36.7 C) (Oral)   Ht _0  (1.549 m)   Wt 145 lb 9.6 oz (66 kg)   BMI 27.51 kg/m  Gen: NAD, alert, cooperative with exam HEENT: NCAT, artificial lens in the left, PERRLA, EOMI CV: RRR, good S1/S2, no murmur Resp: CTABL, no wheezes, non-labored Abd: Positive bowel sounds Ext: No edema, warm Neuro: Alert and oriented, No gross deficits  Assessment and plan:  # Hypertension Well-controlled on HCTZ, also helped by diltiazem Continue Labs  # Atrial fibrillation Rate controlled on diltiazem Patient on full dose aspirin Declined anticoagulation with EP in the fall.   # Hyperlipidemia Good medication compliance, refilled Lipitor today Repeat labs, nonfasting so only LDL was drawn  # Vitamin D deficiency Taking weekly vitamin D 50,000 units Repeat labs   Orders Placed This Encounter  Procedures  . VITAMIN D 25 Hydroxy (Vit-D Deficiency, Fractures)  . CBC with Differential/Platelet  . CMP14+EGFR  . LDL Cholesterol, Direct    Meds ordered this encounter  Medications  . atorvastatin (LIPITOR) 10 MG tablet    Sig: Take 1 tablet (10 mg total) by mouth every evening.    Dispense:  90 tablet    Refill:  3  . hydrochlorothiazide (HYDRODIURIL) 12.5 MG tablet    Sig: Take  1 tablet (12.5 mg total) by mouth daily.    Dispense:  90 tablet    Refill:  [place], MD [facility] 05/16/2016, 1:43 PM

## 2016-05-17 LAB — CBC WITH DIFFERENTIAL/PLATELET
Basophils Absolute: 0 10*3/uL (ref 0.0–0.2)
Basos: 0 %
EOS (ABSOLUTE): 0.2 10*3/uL (ref 0.0–0.4)
EOS: 3 %
HEMATOCRIT: 41.3 % (ref 34.0–46.6)
HEMOGLOBIN: 14.1 g/dL (ref 11.1–15.9)
IMMATURE GRANS (ABS): 0 10*3/uL (ref 0.0–0.1)
IMMATURE GRANULOCYTES: 0 %
LYMPHS: 42 %
Lymphocytes Absolute: 3.5 10*3/uL — ABNORMAL HIGH (ref 0.7–3.1)
MCH: 32.6 pg (ref 26.6–33.0)
MCHC: 34.1 g/dL (ref 31.5–35.7)
MCV: 96 fL (ref 79–97)
MONOCYTES: 8 %
Monocytes Absolute: 0.6 10*3/uL (ref 0.1–0.9)
NEUTROS PCT: 47 %
Neutrophils Absolute: 3.8 10*3/uL (ref 1.4–7.0)
Platelets: 220 10*3/uL (ref 150–379)
RBC: 4.32 x10E6/uL (ref 3.77–5.28)
RDW: 14.3 % (ref 12.3–15.4)
WBC: 8.1 10*3/uL (ref 3.4–10.8)

## 2016-05-17 LAB — CMP14+EGFR
ALBUMIN: 4.2 g/dL (ref 3.5–4.7)
ALT: 42 IU/L — ABNORMAL HIGH (ref 0–32)
AST: 41 IU/L — AB (ref 0–40)
Albumin/Globulin Ratio: 1.6 (ref 1.2–2.2)
Alkaline Phosphatase: 70 IU/L (ref 39–117)
BUN/Creatinine Ratio: 14 (ref 12–28)
BUN: 10 mg/dL (ref 8–27)
Bilirubin Total: 0.4 mg/dL (ref 0.0–1.2)
CALCIUM: 9.7 mg/dL (ref 8.7–10.3)
CO2: 26 mmol/L (ref 18–29)
CREATININE: 0.72 mg/dL (ref 0.57–1.00)
Chloride: 98 mmol/L (ref 96–106)
GFR calc Af Amer: 86 mL/min/{1.73_m2} (ref >59)
GFR, EST NON AFRICAN AMERICAN: 74 mL/min/{1.73_m2} (ref >59)
Globulin, Total: 2.7 g/dL (ref 1.5–4.5)
Glucose: 85 mg/dL (ref 65–99)
Potassium: 4.2 mmol/L (ref 3.5–5.2)
SODIUM: 140 mmol/L (ref 134–144)
TOTAL PROTEIN: 6.9 g/dL (ref 6.0–8.5)

## 2016-05-17 LAB — VITAMIN D 25 HYDROXY (VIT D DEFICIENCY, FRACTURES): Vit D, 25-Hydroxy: 37.5 ng/mL (ref 30.0–100.0)

## 2016-05-17 LAB — LDL CHOLESTEROL, DIRECT: LDL Direct: 126 mg/dL — ABNORMAL HIGH (ref 0–99)

## 2016-05-20 DIAGNOSIS — H353231 Exudative age-related macular degeneration, bilateral, with active choroidal neovascularization: Secondary | ICD-10-CM | POA: Diagnosis not present

## 2016-05-20 DIAGNOSIS — H401132 Primary open-angle glaucoma, bilateral, moderate stage: Secondary | ICD-10-CM | POA: Diagnosis not present

## 2016-05-28 ENCOUNTER — Encounter: Payer: Self-pay | Admitting: Internal Medicine

## 2016-05-28 DIAGNOSIS — R001 Bradycardia, unspecified: Secondary | ICD-10-CM | POA: Diagnosis not present

## 2016-05-28 DIAGNOSIS — I495 Sick sinus syndrome: Secondary | ICD-10-CM | POA: Diagnosis not present

## 2016-06-07 ENCOUNTER — Encounter: Payer: Self-pay | Admitting: Physician Assistant

## 2016-06-07 ENCOUNTER — Ambulatory Visit (INDEPENDENT_AMBULATORY_CARE_PROVIDER_SITE_OTHER): Payer: Medicare Other | Admitting: Physician Assistant

## 2016-06-07 VITALS — BP 135/62 | HR 61 | Temp 96.9°F | Ht 61.0 in | Wt 147.8 lb

## 2016-06-07 DIAGNOSIS — S90922A Unspecified superficial injury of left foot, initial encounter: Secondary | ICD-10-CM

## 2016-06-07 DIAGNOSIS — W57XXXA Bitten or stung by nonvenomous insect and other nonvenomous arthropods, initial encounter: Secondary | ICD-10-CM | POA: Diagnosis not present

## 2016-06-07 MED ORDER — DOXYCYCLINE HYCLATE 100 MG PO TABS: 100.0000 mg | ORAL_TABLET | Freq: Two times a day (BID) | ORAL | 0 refills | Status: DC

## 2016-06-07 NOTE — Patient Instructions (Signed)
Tick Bite Information Introduction Ticks are insects that attach themselves to the skin. There are many types of ticks. Common types include wood ticks and deer ticks. Sometimes, ticks carry diseases that can make a person very ill. The most common places for ticks to attach themselves are the scalp, neck, armpits, waist, and groin. HOW CAN YOU PREVENT TICK BITES? Take these steps to help prevent tick bites when you are outdoors:  Wear long sleeves and long pants.  Wear white clothes so you can see ticks more easily.  Tuck your pant legs into your socks.  If walking on a trail, stay in the middle of the trail to avoid brushing against bushes.  Avoid walking through areas with long grass.  Put bug spray on all skin that is showing and along boot tops, pant legs, and sleeve cuffs.  Check clothes, hair, and skin often and before going inside.  Brush off any ticks that are not attached.  Take a shower or bath as soon as possible after being outdoors. HOW SHOULD YOU REMOVE A TICK? Ticks should be removed as soon as possible to help prevent diseases. 1. If latex gloves are available, put them on before trying to remove a tick. 2. Use tweezers to grasp the tick as close to the skin as possible. You may also use curved forceps or a tick removal tool. Grasp the tick as close to its head as possible. Avoid grasping the tick on its body. 3. Pull gently upward until the tick lets go. Do not twist the tick or jerk it suddenly. This may break off the tick's head or mouth parts. 4. Do not squeeze or crush the tick's body. This could force disease-carrying fluids from the tick into your body. 5. After the tick is removed, wash the bite area and your hands with soap and water or alcohol. 6. Apply a small amount of antiseptic cream or ointment to the bite site. 7. Wash any tools that were used. Do not try to remove a tick by applying a hot match, petroleum jelly, or fingernail polish to the tick. These  methods do not work. They may also increase the chances of disease being spread from the tick bite. WHEN SHOULD YOU SEEK HELP? Contact your health care provider if you are unable to remove a tick or if a part of the tick breaks off in the skin. After a tick bite, you need to watch for signs and symptoms of diseases that can be spread by ticks. Contact your health care provider if you develop any of the following:  Fever.  Rash.  Redness and puffiness (swelling) in the area of the tick bite.  Tender, puffy lymph glands.  Watery poop (diarrhea).  Weight loss.  Cough.  Feeling more tired than normal (fatigue).  Muscle, joint, or bone pain.  Belly (abdominal) pain.  Headache.  Change in your level of consciousness.  Trouble walking or moving your legs.  Loss of feeling (numbness) in the legs.  Loss of movement (paralysis).  Shortness of breath.  Confusion.  Throwing up (vomiting) many times. This information is not intended to replace advice given to you by your health care provider. Make sure you discuss any questions you have with your health care provider. Document Released: 06/12/2009 Document Revised: 08/24/2015 Document Reviewed: 08/26/2012 Elsevier Interactive Patient Education  2017 Elsevier Inc.  

## 2016-06-07 NOTE — Progress Notes (Signed)
BP 135/62   Pulse 61   Temp (!) 96.9 F (36.1 C) (Oral)   Ht 5\' 1"  (1.549 m)   Wt 147 lb 12.8 oz (67 kg)   BMI 27.93 kg/m    Subjective:    Patient ID: [name] [name], female    DOB: Oct 03, 1924, 83 y.o.   MRN: [id number]  HPI: [name] [name] is a 83 y.o. female presenting on 06/07/2016 for Tick Removal (Between toes )  Five days ago was doing yard work and the next morning saw tick on her left foot second toe. It was removed. Has been caring for the infection but has continued to swell. Tender at this point. Mild redness. Denies fever, chills, NVD, headache.  Relevant past medical, surgical, family and social history reviewed and updated as indicated. Allergies and medications reviewed and updated.  Past Medical History:  Diagnosis Date  . Atrioventricular block, complete (HCC) 2006  . Hyperlipidemia   . Hypertension   . Paroxysmal atrial fibrillation (HCC) 2016   single episode detected on PPM interrogation    Past Surgical History:  Procedure Laterality Date  . ABDOMINAL HYSTERECTOMY    . EYE SURGERY Bilateral    cataracts  . GALLBLADDER SURGERY    . Normal coronary angiography  1999  . PULSE GENERATOR IMPLANT     Guidant Ultra dual-mode, dual-pacing, dual-sensing generator for complete atrioventricular block 2006 with some noise on atrial lead and generator recall for very low incidence of loss of output    Review of Systems  Constitutional: Negative.  Negative for activity change, fatigue and fever.  HENT: Negative.   Eyes: Negative.   Respiratory: Negative.  Negative for cough.   Cardiovascular: Negative.  Negative for chest pain.  Gastrointestinal: Negative.  Negative for abdominal pain.  Endocrine: Negative.   Genitourinary: Negative.  Negative for dysuria.  Musculoskeletal: Negative.   Skin: Positive for color change, rash and wound.  Neurological: Negative.     Allergies as of 06/07/2016   No Known Allergies     Medication List       Accurate as of  06/07/16 12:25 PM. Always use your most recent med list.          aspirin 325 MG tablet Take 325 mg by mouth daily.   atorvastatin 10 MG tablet Commonly known as:  LIPITOR Take 1 tablet (10 mg total) by mouth every evening.   diltiazem 120 MG tablet Commonly known as:  CARDIZEM Take 120 mg by mouth daily.   doxycycline 100 MG tablet Commonly known as:  VIBRA-TABS Take 1 tablet (100 mg total) by mouth 2 (two) times daily. 1 po bid   ergocalciferol 50000 units capsule Commonly known as:  VITAMIN D2 Take 50,000 Units by mouth once a week.   hydrochlorothiazide 12.5 MG tablet Commonly known as:  HYDRODIURIL Take 1 tablet (12.5 mg total) by mouth daily.   levothyroxine 75 MCG tablet Commonly known as:  SYNTHROID, LEVOTHROID Take 37.5 mcg by mouth daily. Except 1 tab ( ) on Sunday.          Objective:    BP 135/62   Pulse 61   Temp (!) 96.9 F (36.1 C) (Oral)   Ht 5\' 1"  (1.549 m)   Wt 147 lb 12.8 oz (67 kg)   BMI 27.93 kg/m   No Known Allergies  Physical Exam  Constitutional: She is oriented to person, place, and time. She appears well-developed and well-nourished.  HENT:  Head: Normocephalic and atraumatic.  Eyes:  Conjunctivae and EOM are normal. Pupils are equal, round, and reactive to light.  Cardiovascular: Normal rate, regular rhythm, normal heart sounds and intact distal pulses.   Pulmonary/Chest: Effort normal and breath sounds normal.  Abdominal: Soft. Bowel sounds are normal.  Neurological: She is alert and oriented to person, place, and time. She has normal reflexes.  Skin: Skin is warm and dry. No rash noted.     Medial side of 2nd toe left foot: bite wound with swelling, tenderness and surrounding erythema. No tick remaining  Psychiatric: She has a normal mood and affect. Her behavior is normal. Judgment and thought content normal.  Nursing note and vitals reviewed.       Assessment & Plan:   1. Tick bite, initial encounter Cellulitis  associated - doxycycline (VIBRA-TABS) 100 MG tablet; Take 1 tablet (100 mg total) by mouth 2 (two) times daily. 1 po bid  Dispense: 20 tablet; Refill: 0   Continue all other maintenance medications as listed above.  Follow up plan: Return if symptoms worsen or fail to improve.  Educational handout given for tick bite  Remus LofflerAngel S. Maryjean Corpening PA-C [facility] [address]  [place], [place] [address] [phone]   06/07/2016, 12:25 PM

## 2016-06-24 DIAGNOSIS — H353231 Exudative age-related macular degeneration, bilateral, with active choroidal neovascularization: Secondary | ICD-10-CM | POA: Diagnosis not present

## 2016-07-12 ENCOUNTER — Ambulatory Visit (INDEPENDENT_AMBULATORY_CARE_PROVIDER_SITE_OTHER): Payer: Medicare Other | Admitting: *Deleted

## 2016-07-12 DIAGNOSIS — Z950 Presence of cardiac pacemaker: Secondary | ICD-10-CM

## 2016-07-12 DIAGNOSIS — I480 Paroxysmal atrial fibrillation: Secondary | ICD-10-CM | POA: Diagnosis not present

## 2016-07-12 DIAGNOSIS — I442 Atrioventricular block, complete: Secondary | ICD-10-CM

## 2016-07-12 LAB — CUP PACEART INCLINIC DEVICE CHECK
Brady Statistic RV Percent Paced: 100 %
Implantable Lead Implant Date: 19980323
Implantable Lead Model: 4524
Implantable Pulse Generator Implant Date: 20060322
Lead Channel Pacing Threshold Amplitude: 0.9 V
Lead Channel Pacing Threshold Amplitude: 1.3 V
Lead Channel Pacing Threshold Pulse Width: 0.4 ms
Lead Channel Pacing Threshold Pulse Width: 0.4 ms
Lead Channel Setting Pacing Amplitude: 2.4 V
Lead Channel Setting Pacing Pulse Width: 0.4 ms
Lead Channel Setting Sensing Sensitivity: 2.5 mV
MDC IDC LEAD IMPLANT DT: 19980323
MDC IDC LEAD LOCATION: 753859
MDC IDC LEAD LOCATION: 753860
MDC IDC PG SERIAL: 120475
MDC IDC SESS DTM: 20180413141116
MDC IDC SET LEADCHNL RA PACING AMPLITUDE: 2 V
MDC IDC STAT BRADY RA PERCENT PACED: 53 %

## 2016-07-12 NOTE — Progress Notes (Signed)
Pacemaker check in clinic. Normal device function. Thresholds, sensing, impedances consistent with previous measurements. Device programmed to maximize longevity. ATR episodes all < 1 min, RA noise (known). No high ventricular rates noted. Device programmed at appropriate safety margins. Histogram distribution appropriate for patient activity level. Device programmed to optimize intrinsic conduction. Estimated longevity 1 year. ROV with JA/Eden 01/03/17.

## 2016-08-05 DIAGNOSIS — H353231 Exudative age-related macular degeneration, bilateral, with active choroidal neovascularization: Secondary | ICD-10-CM | POA: Diagnosis not present

## 2016-08-27 ENCOUNTER — Encounter: Payer: Self-pay | Admitting: Internal Medicine

## 2016-08-27 DIAGNOSIS — I495 Sick sinus syndrome: Secondary | ICD-10-CM | POA: Diagnosis not present

## 2016-08-27 DIAGNOSIS — R001 Bradycardia, unspecified: Secondary | ICD-10-CM | POA: Diagnosis not present

## 2016-09-09 DIAGNOSIS — H353231 Exudative age-related macular degeneration, bilateral, with active choroidal neovascularization: Secondary | ICD-10-CM | POA: Diagnosis not present

## 2016-09-13 ENCOUNTER — Ambulatory Visit (INDEPENDENT_AMBULATORY_CARE_PROVIDER_SITE_OTHER): Payer: Medicare Other | Admitting: Family Medicine

## 2016-09-13 ENCOUNTER — Encounter: Payer: Self-pay | Admitting: Family Medicine

## 2016-09-13 VITALS — BP 133/71 | HR 72 | Temp 97.4°F | Ht 61.0 in | Wt 144.2 lb

## 2016-09-13 DIAGNOSIS — W19XXXA Unspecified fall, initial encounter: Secondary | ICD-10-CM | POA: Diagnosis not present

## 2016-09-13 DIAGNOSIS — I10 Essential (primary) hypertension: Secondary | ICD-10-CM

## 2016-09-13 DIAGNOSIS — I480 Paroxysmal atrial fibrillation: Secondary | ICD-10-CM | POA: Diagnosis not present

## 2016-09-13 NOTE — Progress Notes (Signed)
   HPI  Patient presents today here for follow-up chronic medical conditions and a fall.  Patient states that 3 days ago she was walking down her hall, while she was walking she stooped over to pick up an item when she did she lost her balance falling forward. She hit her head on a door casing and fell backwards landing on her buttock. She called her son who helped her up. She denies any loss of consciousness during this episode, vision changes, numbness or tingling one side of the body, or residual headaches. She has mild tenderness around the site where her head was hit.  Hypertension Doing well with hydrochlorothiazide No dizziness. No headaches, no chest pain.  PMH: Smoking status noted ROS: Per HPI  Objective: BP 133/71   Pulse 72   Temp 97.4 F (36.3 C) (Oral)   Ht 5\' 1"  (1.549 m)   Wt 144 lb 3.2 oz (65.4 kg)   BMI 27.25 kg/m  Gen: NAD, alert, cooperative with exam HEENT: Healing skin lesion on the right lateral skull just posterior to the temple, very mild tenderness to palpation, EOMI, PERRL CV: RRR, good S1/S2, no murmur Resp: CTABL, no wheezes, non-labored Ext: No edema, warm Neuro: Alert and oriented, No gross deficits  Assessment and plan:  # Fall Patient with fall 3 days ago with no residual symptoms. She feels well and denies any headaches, vision changes, changes in balance, or other concerns. Discussed getting a CT of her head to be sure there is no intracranial bleeding, however she feels well and does not want to pursue this. She is taking a daily aspirin for A. fib  # A. fib Declined anticoagulation previously, inside of her recent fall this looks like a very good decision. Rate controlled Continue full dose aspirin  # Hypertension Well-controlled on HCTZ plus diltiazem, diltiazem mainly for rate control. Continue current medications  Reviewed multiple red flags for signs of intracranial bleeding including headache, change in vision, dizziness, loss of  balance, numbness or tingling and she will maintain a very low threshold for calling if she develops any of these or other concerns.   Murtis SinkSam Maeva Dant, MD [facility] 09/13/2016, 3:03 PM

## 2016-10-21 DIAGNOSIS — H353231 Exudative age-related macular degeneration, bilateral, with active choroidal neovascularization: Secondary | ICD-10-CM | POA: Diagnosis not present

## 2016-11-26 DIAGNOSIS — R001 Bradycardia, unspecified: Secondary | ICD-10-CM | POA: Diagnosis not present

## 2016-11-27 ENCOUNTER — Other Ambulatory Visit: Payer: Self-pay | Admitting: *Deleted

## 2016-11-27 ENCOUNTER — Other Ambulatory Visit: Payer: Self-pay

## 2016-11-27 MED ORDER — DILTIAZEM HCL ER COATED BEADS 120 MG PO CP24: 120.0000 mg | ORAL_CAPSULE | Freq: Every day | ORAL | 5 refills | Status: DC

## 2016-11-27 MED ORDER — DILTIAZEM HCL 120 MG PO TABS: 120.0000 mg | ORAL_TABLET | Freq: Every day | ORAL | 0 refills | Status: DC

## 2016-12-06 ENCOUNTER — Ambulatory Visit (INDEPENDENT_AMBULATORY_CARE_PROVIDER_SITE_OTHER): Payer: Self-pay | Admitting: *Deleted

## 2016-12-06 DIAGNOSIS — I442 Atrioventricular block, complete: Secondary | ICD-10-CM

## 2016-12-06 DIAGNOSIS — Z950 Presence of cardiac pacemaker: Secondary | ICD-10-CM

## 2016-12-06 LAB — CUP PACEART INCLINIC DEVICE CHECK
Brady Statistic RA Percent Paced: 50 %
Brady Statistic RV Percent Paced: 100 %
Date Time Interrogation Session: 20180907040000
Implantable Lead Implant Date: 19980323
Implantable Lead Location: 753860
MDC IDC LEAD IMPLANT DT: 19980323
MDC IDC LEAD LOCATION: 753859
MDC IDC PG IMPLANT DT: 20060322
MDC IDC SET LEADCHNL RA PACING AMPLITUDE: 2 V
MDC IDC SET LEADCHNL RV PACING AMPLITUDE: 2.4 V
MDC IDC SET LEADCHNL RV PACING PULSEWIDTH: 0.4 ms
MDC IDC SET LEADCHNL RV SENSING SENSITIVITY: 2.5 mV
Pulse Gen Model: 1291
Pulse Gen Serial Number: 120475

## 2016-12-06 NOTE — Progress Notes (Signed)
Battery check due to TTM suggesting intensified follow up. <0.5 yr remaining. ROV with JA/Eden 01/03/17. Probable monthly PPM checks thereafter.

## 2016-12-09 DIAGNOSIS — H353231 Exudative age-related macular degeneration, bilateral, with active choroidal neovascularization: Secondary | ICD-10-CM | POA: Diagnosis not present

## 2017-01-03 ENCOUNTER — Other Ambulatory Visit: Payer: Medicare Other

## 2017-01-03 ENCOUNTER — Ambulatory Visit (INDEPENDENT_AMBULATORY_CARE_PROVIDER_SITE_OTHER): Payer: Medicare Other | Admitting: Internal Medicine

## 2017-01-03 ENCOUNTER — Other Ambulatory Visit: Payer: Self-pay | Admitting: *Deleted

## 2017-01-03 ENCOUNTER — Encounter: Payer: Self-pay | Admitting: Internal Medicine

## 2017-01-03 ENCOUNTER — Encounter: Payer: Self-pay | Admitting: *Deleted

## 2017-01-03 VITALS — BP 138/60 | HR 70 | Ht 61.0 in | Wt 139.8 lb

## 2017-01-03 DIAGNOSIS — Z01812 Encounter for preprocedural laboratory examination: Secondary | ICD-10-CM | POA: Diagnosis not present

## 2017-01-03 DIAGNOSIS — I4891 Unspecified atrial fibrillation: Secondary | ICD-10-CM | POA: Diagnosis not present

## 2017-01-03 DIAGNOSIS — I480 Paroxysmal atrial fibrillation: Secondary | ICD-10-CM

## 2017-01-03 DIAGNOSIS — I10 Essential (primary) hypertension: Secondary | ICD-10-CM

## 2017-01-03 DIAGNOSIS — I442 Atrioventricular block, complete: Secondary | ICD-10-CM

## 2017-01-03 MED ORDER — ASPIRIN EC 81 MG PO TBEC: 81.0000 mg | DELAYED_RELEASE_TABLET | Freq: Every day | ORAL | Status: DC

## 2017-01-03 NOTE — Progress Notes (Signed)
    PCP: Elenora Gamma, MD Primary Cardiologist:  Dr Antoine Poche Primary EP:  Dr Zenovia Jordan [name] [name] is a 83 y.o. female who presents today for routine electrophysiology followup.  Since last being seen in our clinic, the patient reports doing very well.  Her device is ERI.   Today, she denies symptoms of palpitations, chest pain, shortness of breath,  lower extremity edema, dizziness, presyncope, or syncope.  The patient is otherwise without complaint today.   Past Medical History:  Diagnosis Date  . Atrioventricular block, complete (HCC) 2006  . Hyperlipidemia   . Hypertension   . Paroxysmal atrial fibrillation (HCC) 2016   single episode detected on PPM interrogation   Past Surgical History:  Procedure Laterality Date  . ABDOMINAL HYSTERECTOMY    . EYE SURGERY Bilateral    cataracts  . GALLBLADDER SURGERY    . Normal coronary angiography  1999  . PULSE GENERATOR IMPLANT     Guidant Ultra dual-mode, dual-pacing, dual-sensing generator for complete atrioventricular block 2006 with some noise on atrial lead and generator recall for very low incidence of loss of output    ROS- all systems are reviewed and negative except as per HPI above  Current Outpatient Prescriptions  Medication Sig Dispense Refill  . aspirin 325 MG tablet Take 325 mg by mouth daily.    Marland Kitchen atorvastatin (LIPITOR) 10 MG tablet Take 1 tablet (10 mg total) by mouth every evening. 90 tablet 3  . diltiazem (CARDIZEM CD) 120 MG 24 hr capsule Take 1 capsule (120 mg total) by mouth daily. 30 capsule 5  . ergocalciferol (VITAMIN D2) 50000 units capsule Take 50,000 Units by mouth once a week.    . hydrochlorothiazide (HYDRODIURIL) 12.5 MG tablet Take 1 tablet (12.5 mg total) by mouth daily. 90 tablet 3  . levothyroxine (SYNTHROID, LEVOTHROID) 75 MCG tablet Take 37.5 mcg by mouth daily. Except 1 tab ( ) on Sunday.     No current facility-administered medications for this visit.     Physical Exam: Vitals:   01/03/17 1158  BP: 138/60  Pulse: 70  SpO2: 96%  Weight: 139 lb 12.8 oz (63.4 kg)  Height:  (1.549 m)    GEN- The patient is well appearing, alert and oriented x 3 today.   Head- normocephalic, atraumatic Eyes-  Sclera clear, conjunctiva pink Ears- hearing intact Oropharynx- clear Lungs- Clear to ausculation bilaterally, normal work of breathing Chest- pacemaker pocket is well healed Heart- Regular rate and rhythm, no murmurs, rubs or gallops, PMI not laterally displaced GI- soft, NT, ND, + BS Extremities- no clubbing, cyanosis, or edema  Pacemaker interrogation- reviewed in detail today,  See PACEART report   Assessment and Plan:  1. Symptomatic complete heart block Normal pacemaker function.  She has reached ERI.  Risks, benefits, and alternatives to pacemaker pulse generator replacement were discussed in detail today.  The patient understands that risks include but are not limited to bleeding, infection, pneumothorax, perforation, tamponade, vascular damage, renal failure, MI, stroke, death,  damage to his existing leads, and lead dislodgement and wishes to proceed.  We will therefore schedule the procedure at the next available time.  See Pace Art report No changes today  2. afib Well controlled Low burden Declines anticoagulation  3. HTN Stable No change required today   Hillis Range MD, [facility] 01/03/2017 12:48 PM

## 2017-01-03 NOTE — Addendum Note (Signed)
Addended by: Lesle Chris on: 01/03/2017 01:13 PM   Modules accepted: Orders

## 2017-01-03 NOTE — Patient Instructions (Addendum)
Medication Instructions:   Decrease Aspirin to  daily.  Continue all other medications.    Labwork:  BMET, CBC, INR - orders given today.   Office will contact with results via phone or letter.    Testing/Procedures: Generator change out.  See instruction sheet.   Follow-Up: To be given at time of discharge from procedure.    Any Other Special Instructions Will Be Listed Below (If Applicable).  If you need a refill on your cardiac medications before your next appointment, please call your pharmacy.

## 2017-01-04 LAB — BASIC METABOLIC PANEL
BUN / CREAT RATIO: 9 — AB (ref 12–28)
BUN: 7 mg/dL — ABNORMAL LOW (ref 10–36)
CHLORIDE: 100 mmol/L (ref 96–106)
CO2: 18 mmol/L — AB (ref 20–29)
Calcium: 9.8 mg/dL (ref 8.7–10.3)
Creatinine, Ser: 0.76 mg/dL (ref 0.57–1.00)
GFR calc Af Amer: 80 mL/min/{1.73_m2} (ref >59)
GFR calc non Af Amer: 69 mL/min/{1.73_m2} (ref >59)
GLUCOSE: 100 mg/dL — AB (ref 65–99)
POTASSIUM: 4 mmol/L (ref 3.5–5.2)
SODIUM: 140 mmol/L (ref 134–144)

## 2017-01-04 LAB — CBC
Hematocrit: 44.4 % (ref 34.0–46.6)
Hemoglobin: 14.6 g/dL (ref 11.1–15.9)
MCH: 32.4 pg (ref 26.6–33.0)
MCHC: 32.9 g/dL (ref 31.5–35.7)
MCV: 99 fL — AB (ref 79–97)
PLATELETS: 203 10*3/uL (ref 150–379)
RBC: 4.5 x10E6/uL (ref 3.77–5.28)
RDW: 14.1 % (ref 12.3–15.4)
WBC: 8.5 10*3/uL (ref 3.4–10.8)

## 2017-01-04 LAB — PROTIME-INR
INR: 1.1 (ref 0.8–1.2)
PROTHROMBIN TIME: 11.5 s (ref 9.1–12.0)

## 2017-01-06 LAB — CUP PACEART INCLINIC DEVICE CHECK
Brady Statistic RA Percent Paced: 50 %
Brady Statistic RV Percent Paced: 100 %
Implantable Lead Location: 753860
Implantable Lead Model: 4524
Implantable Lead Model: 5034
Lead Channel Impedance Value: 440 Ohm
Lead Channel Pacing Threshold Amplitude: 0.8 V
Lead Channel Pacing Threshold Pulse Width: 0.4 ms
Lead Channel Pacing Threshold Pulse Width: 0.4 ms
Lead Channel Sensing Intrinsic Amplitude: 1.1 mV
Lead Channel Setting Pacing Amplitude: 2 V
MDC IDC LEAD IMPLANT DT: 19980323
MDC IDC LEAD IMPLANT DT: 19980323
MDC IDC LEAD LOCATION: 753859
MDC IDC MSMT LEADCHNL RV IMPEDANCE VALUE: 1400 Ohm
MDC IDC MSMT LEADCHNL RV PACING THRESHOLD AMPLITUDE: 1.3 V
MDC IDC MSMT LEADCHNL RV PACING THRESHOLD AMPLITUDE: 1.4 V
MDC IDC MSMT LEADCHNL RV PACING THRESHOLD PULSEWIDTH: 0.4 ms
MDC IDC PG IMPLANT DT: 20060322
MDC IDC PG SERIAL: 120475
MDC IDC SESS DTM: 20181005040000
MDC IDC SET LEADCHNL RV PACING AMPLITUDE: 2.4 V
MDC IDC SET LEADCHNL RV PACING PULSEWIDTH: 0.4 ms
MDC IDC SET LEADCHNL RV SENSING SENSITIVITY: 2.5 mV

## 2017-01-07 ENCOUNTER — Encounter: Payer: Self-pay | Admitting: *Deleted

## 2017-01-09 ENCOUNTER — Telehealth: Payer: Self-pay | Admitting: *Deleted

## 2017-01-09 NOTE — Telephone Encounter (Signed)
-----   Message from Hillis Range, MD sent at 01/08/2017  9:59 AM EDT ----- Results reviewed.  Isabelle Course, please inform pt of result. I will route to primary care also.

## 2017-01-13 ENCOUNTER — Ambulatory Visit: Payer: Medicare Other | Admitting: Family Medicine

## 2017-01-13 NOTE — Telephone Encounter (Signed)
Patient informed. 

## 2017-01-15 ENCOUNTER — Encounter (HOSPITAL_COMMUNITY)
Admission: RE | Disposition: A | Discharge status: Home / Self Care | Payer: Self-pay | Source: Ambulatory Visit | Attending: Internal Medicine

## 2017-01-15 ENCOUNTER — Ambulatory Visit (HOSPITAL_COMMUNITY)
Admission: RE | Admit: 2017-01-15 | Discharge: 2017-01-15 | Disposition: A | Discharge status: Home / Self Care | Payer: Medicare Other | Source: Ambulatory Visit | Attending: Internal Medicine | Admitting: Internal Medicine

## 2017-01-15 DIAGNOSIS — Z7982 Long term (current) use of aspirin: Secondary | ICD-10-CM | POA: Diagnosis not present

## 2017-01-15 DIAGNOSIS — I442 Atrioventricular block, complete: Secondary | ICD-10-CM | POA: Diagnosis not present

## 2017-01-15 DIAGNOSIS — E785 Hyperlipidemia, unspecified: Secondary | ICD-10-CM | POA: Diagnosis not present

## 2017-01-15 DIAGNOSIS — I10 Essential (primary) hypertension: Secondary | ICD-10-CM | POA: Diagnosis not present

## 2017-01-15 DIAGNOSIS — Z45010 Encounter for checking and testing of cardiac pacemaker pulse generator [battery]: Secondary | ICD-10-CM | POA: Diagnosis not present

## 2017-01-15 DIAGNOSIS — I480 Paroxysmal atrial fibrillation: Secondary | ICD-10-CM | POA: Insufficient documentation

## 2017-01-15 HISTORY — PX: PPM GENERATOR CHANGEOUT: EP1233

## 2017-01-15 LAB — SURGICAL PCR SCREEN
MRSA, PCR: NEGATIVE
Staphylococcus aureus: NEGATIVE

## 2017-01-15 SURGERY — PPM GENERATOR CHANGEOUT

## 2017-01-15 MED ORDER — CEFAZOLIN SODIUM-DEXTROSE 2-4 GM/100ML-% IV SOLN
2.0000 g | INTRAVENOUS | Status: AC
  Administered 2017-01-15: 2 g via INTRAVENOUS

## 2017-01-15 MED ORDER — MUPIROCIN 2 % EX OINT
TOPICAL_OINTMENT | CUTANEOUS | Status: AC
  Administered 2017-01-15: 1
  Filled 2017-01-15: qty 22

## 2017-01-15 MED ORDER — BUPIVACAINE HCL (PF) 0.25 % IJ SOLN
INTRAMUSCULAR | Status: AC
Start: 2017-01-15 — End: 2017-01-15
  Filled 2017-01-15: qty 30

## 2017-01-15 MED ORDER — ACETAMINOPHEN 325 MG PO TABS: 325.0000 mg | ORAL_TABLET | ORAL | Status: DC | PRN

## 2017-01-15 MED ORDER — MIDAZOLAM HCL 5 MG/5ML IJ SOLN
INTRAMUSCULAR | Status: DC | PRN
  Administered 2017-01-15: 1 mg via INTRAVENOUS

## 2017-01-15 MED ORDER — FENTANYL CITRATE (PF) 100 MCG/2ML IJ SOLN
INTRAMUSCULAR | Status: DC | PRN
  Administered 2017-01-15: 12.5 ug via INTRAVENOUS

## 2017-01-15 MED ORDER — SODIUM CHLORIDE 0.9% FLUSH: 3.0000 mL | INTRAVENOUS | Status: DC | PRN

## 2017-01-15 MED ORDER — BUPIVACAINE HCL (PF) 0.25 % IJ SOLN
INTRAMUSCULAR | Status: DC | PRN
  Administered 2017-01-15: 35 mL

## 2017-01-15 MED ORDER — SODIUM CHLORIDE 0.9 % IR SOLN
80.0000 mg | Status: AC
  Administered 2017-01-15: 80 mg

## 2017-01-15 MED ORDER — CEFAZOLIN SODIUM-DEXTROSE 2-4 GM/100ML-% IV SOLN
INTRAVENOUS | Status: AC
  Filled 2017-01-15: qty 100

## 2017-01-15 MED ORDER — MIDAZOLAM HCL 5 MG/5ML IJ SOLN
INTRAMUSCULAR | Status: AC
Start: 2017-01-15 — End: 2017-01-15
  Filled 2017-01-15: qty 5

## 2017-01-15 MED ORDER — SODIUM CHLORIDE 0.9 % IV SOLN: 250.0000 mL | INTRAVENOUS | Status: DC | PRN

## 2017-01-15 MED ORDER — SODIUM CHLORIDE 0.9 % IR SOLN
Status: AC
  Filled 2017-01-15: qty 2

## 2017-01-15 MED ORDER — MUPIROCIN 2 % EX OINT: 1.0000 "application " | TOPICAL_OINTMENT | Freq: Once | CUTANEOUS | Status: DC

## 2017-01-15 MED ORDER — FENTANYL CITRATE (PF) 100 MCG/2ML IJ SOLN
INTRAMUSCULAR | Status: AC
  Filled 2017-01-15: qty 2

## 2017-01-15 MED ORDER — ONDANSETRON HCL 4 MG/2ML IJ SOLN: 4.0000 mg | Freq: Four times a day (QID) | INTRAMUSCULAR | Status: DC | PRN

## 2017-01-15 MED ORDER — BUPIVACAINE HCL (PF) 0.25 % IJ SOLN
INTRAMUSCULAR | Status: AC
  Filled 2017-01-15: qty 30

## 2017-01-15 MED ORDER — SODIUM CHLORIDE 0.9% FLUSH: 3.0000 mL | Freq: Two times a day (BID) | INTRAVENOUS | Status: DC

## 2017-01-15 MED ORDER — CHLORHEXIDINE GLUCONATE 4 % EX LIQD: 60.0000 mL | Freq: Once | CUTANEOUS | Status: DC

## 2017-01-15 MED ORDER — SODIUM CHLORIDE 0.9 % IV SOLN
INTRAVENOUS | Status: DC
  Administered 2017-01-15: 11:00:00 via INTRAVENOUS

## 2017-01-15 SURGICAL SUPPLY — 4 items
CABLE SURGICAL S-101-97-12 (CABLE) ×2 IMPLANT
PACEMAKER ACCOLADE DR-EL (Pacemaker) ×1 IMPLANT
PAD DEFIB LIFELINK (PAD) ×1 IMPLANT
TRAY PACEMAKER INSERTION (PACKS) ×2 IMPLANT

## 2017-01-15 NOTE — Interval H&P Note (Signed)
History and Physical Interval Note:  01/15/2017 11:54 AM  [name] [name]  has presented today for surgery, with the diagnosis of ERI  The various methods of treatment have been discussed with the patient and family. After consideration of risks, benefits and other options for treatment, the patient has consented to  Procedure(s): PPM GENERATOR CHANGEOUT (N/A) as a surgical intervention .  The patient's history has been reviewed, patient examined, no change in status, stable for surgery.  I have reviewed the patient's chart and labs.  Questions were answered to the patient's satisfaction.     Hillis RangeJames Rebecka Oelkers

## 2017-01-15 NOTE — H&P (View-Only) (Signed)
    PCP: Elenora GammaBradshaw, Samuel L, MD Primary Cardiologist:  Dr Antoine PocheHochrein Primary EP:  Dr Zenovia JordanAllred  [name] [name] is a 83 y.o. female who presents today for routine electrophysiology followup.  Since last being seen in our clinic, the patient reports doing very well.  Her device is ERI.   Today, she denies symptoms of palpitations, chest pain, shortness of breath,  lower extremity edema, dizziness, presyncope, or syncope.  The patient is otherwise without complaint today.   Past Medical History:  Diagnosis Date  . Atrioventricular block, complete (HCC) 2006  . Hyperlipidemia   . Hypertension   . Paroxysmal atrial fibrillation (HCC) 2016   single episode detected on PPM interrogation   Past Surgical History:  Procedure Laterality Date  . ABDOMINAL HYSTERECTOMY    . EYE SURGERY Bilateral    cataracts  . GALLBLADDER SURGERY    . Normal coronary angiography  1999  . PULSE GENERATOR IMPLANT     Guidant Ultra dual-mode, dual-pacing, dual-sensing generator for complete atrioventricular block 2006 with some noise on atrial lead and generator recall for very low incidence of loss of output    ROS- all systems are reviewed and negative except as per HPI above  Current Outpatient Prescriptions  Medication Sig Dispense Refill  . aspirin 325 MG tablet Take 325 mg by mouth daily.    Marland Kitchen. atorvastatin (LIPITOR) 10 MG tablet Take 1 tablet (10 mg total) by mouth every evening. 90 tablet 3  . diltiazem (CARDIZEM CD) 120 MG 24 hr capsule Take 1 capsule (120 mg total) by mouth daily. 30 capsule 5  . ergocalciferol (VITAMIN D2) 50000 units capsule Take 50,000 Units by mouth once a week.    . hydrochlorothiazide (HYDRODIURIL) 12.5 MG tablet Take 1 tablet (12.5 mg total) by mouth daily. 90 tablet 3  . levothyroxine (SYNTHROID, LEVOTHROID) 75 MCG tablet Take 37.5 mcg by mouth daily. Except 1 tab (75mcg) on Sunday.     No current facility-administered medications for this visit.     Physical Exam: Vitals:   01/03/17 1158  BP: 138/60  Pulse: 70  SpO2: 96%  Weight: 139 lb 12.8 oz (63.4 kg)  Height: 5\' 1"  (1.549 m)    GEN- The patient is well appearing, alert and oriented x 3 today.   Head- normocephalic, atraumatic Eyes-  Sclera clear, conjunctiva pink Ears- hearing intact Oropharynx- clear Lungs- Clear to ausculation bilaterally, normal work of breathing Chest- pacemaker pocket is well healed Heart- Regular rate and rhythm, no murmurs, rubs or gallops, PMI not laterally displaced GI- soft, NT, ND, + BS Extremities- no clubbing, cyanosis, or edema  Pacemaker interrogation- reviewed in detail today,  See PACEART report   Assessment and Plan:  1. Symptomatic complete heart block Normal pacemaker function.  She has reached ERI.  Risks, benefits, and alternatives to pacemaker pulse generator replacement were discussed in detail today.  The patient understands that risks include but are not limited to bleeding, infection, pneumothorax, perforation, tamponade, vascular damage, renal failure, MI, stroke, death,  damage to his existing leads, and lead dislodgement and wishes to proceed.  We will therefore schedule the procedure at the next available time.  See Pace Art report No changes today  2. afib Well controlled Low burden Declines anticoagulation  3. HTN Stable No change required today   Hillis RangeJames Qunisha Bryk MD, [facility] 01/03/2017 12:48 PM

## 2017-01-15 NOTE — Discharge Instructions (Signed)
Pacemaker Battery Change, Care After This sheet gives you information about how to care for yourself after your procedure. Your health care provider may also give you more specific instructions. If you have problems or questions, contact your health care provider. What can I expect after the procedure? After your procedure, it is common to have:  Pain or soreness at the site where the pacemaker was inserted.  Swelling at the site where the pacemaker was inserted.  Follow these instructions at home: Incision care  REMOVE OUTER DRESSING TOMORROW// LET STERI STRIPS FALL OFF, KEEP SITE CLEAN AND DRY TILL FOLLOW UP APPOINTMENT  Keep the incision clean and dry. ? Do not take baths, swim, or use a hot tub until your health care provider approves. ? You may shower the day after your procedure, or as directed by your health care provider. ? Pat the area dry with a clean towel. Do not rub the area. This may cause bleeding.  Follow instructions from your health care provider about how to take care of your incision. Make sure you: ? Wash your hands with soap and water before you change your bandage (dressing). If soap and water are not available, use hand sanitizer. ? Change your dressing as told by your health care provider. ? Leave stitches (sutures), skin glue, or adhesive strips in place. These skin closures may need to stay in place for 2 weeks or longer. If adhesive strip edges start to loosen and curl up, you may trim the loose edges. Do not remove adhesive strips completely unless your health care provider tells you to do that.  Check your incision area every day for signs of infection. Check for: ? More redness, swelling, or pain. ? More fluid or blood. ? Warmth. ? Pus or a bad smell. Activity  Do not lift anything that is heavier than 10 lb (4.5 kg) until your health care provider says it is okay to do so.  For the first 2 weeks, or as long as told by your health care provider: ? Avoid  lifting your left arm higher than your shoulder. ? Be gentle when you move your arms over your head. It is okay to raise your arm to comb your hair. ? Avoid strenuous exercise.  Ask your health care provider when it is okay to: ? Resume your normal activities. ? Return to work or school. ? Resume sexual activity. Eating and drinking  Eat a heart-healthy diet. This should include plenty of fresh fruits and vegetables, whole grains, low-fat dairy products, and lean protein like chicken and fish.  Limit alcohol intake to no more than 1 drink a day for non-pregnant women and 2 drinks a day for men. One drink equals 12 oz of beer, 5 oz of wine, or 1 oz of hard liquor.  Check ingredients and nutrition facts on packaged foods and beverages. Avoid the following types of food: ? Food that is high in salt (sodium). ? Food that is high in saturated fat, like full-fat dairy or red meat. ? Food that is high in trans fat, like fried food. ? Food and drinks that are high in sugar. Lifestyle  Do not use any products that contain nicotine or tobacco, such as cigarettes and e-cigarettes. If you need help quitting, ask your health care provider.  Take steps to manage and control your weight.  Get regular exercise. Aim for 150 minutes of moderate-intensity exercise (such as walking or yoga) or 75 minutes of vigorous exercise (such as running  or swimming) each week.  Manage other health problems, such as diabetes or high blood pressure. Ask your health care provider how you can manage these conditions. General instructions  Do not drive for 24 hours after your procedure if you were given a medicine to help you relax (sedative).  Take over-the-counter and prescription medicines only as told by your health care provider.  Avoid putting pressure on the area where the pacemaker was placed.  If you need an MRI after your pacemaker has been placed, be sure to tell the health care provider who orders the  MRI that you have a pacemaker.  Avoid close and prolonged exposure to electrical devices that have strong magnetic fields. These include: ? Cell phones. Avoid keeping them in a pocket near the pacemaker, and try using the ear opposite the pacemaker. ? MP3 players. ? Household appliances, like microwaves. ? Metal detectors. ? Electric generators. ? High-tension wires.  Keep all follow-up visits as directed by your health care provider. This is important. Contact a health care provider if:  You have pain at the incision site that is not relieved by over-the-counter or prescription medicines.  You have any of these around your incision site or coming from it: ? More redness, swelling, or pain. ? Fluid or blood. ? Warmth to the touch. ? Pus or a bad smell.  You have a fever.  You feel brief, occasional palpitations, light-headedness, or any symptoms that you think might be related to your heart. Get help right away if:  You experience chest pain that is different from the pain at the pacemaker site.  You develop a red streak that extends above or below the incision site.  You experience shortness of breath.  You have palpitations or an irregular heartbeat.  You have light-headedness that does not go away quickly.  You faint or have dizzy spells.  Your pulse suddenly drops or increases rapidly and does not return to normal.  You begin to gain weight and your legs and ankles swell. Summary  After your procedure, it is common to have pain, soreness, and some swelling where the pacemaker was inserted.  Make sure to keep your incision clean and dry. Follow instructions from your health care provider about how to take care of your incision.  Check your incision every day for signs of infection, such as more pain or swelling, pus or a bad smell, warmth, or leaking fluid and blood.  Avoid strenuous exercise and lifting your left arm higher than your shoulder for 2 weeks, or as  long as told by your health care provider. This information is not intended to replace advice given to you by your health care provider. Make sure you discuss any questions you have with your health care provider. Document Released: 01/06/2013 Document Revised: 02/08/2016 Document Reviewed: 02/08/2016 Elsevier Interactive Patient Education  2017 [employer].

## 2017-01-16 ENCOUNTER — Encounter (HOSPITAL_COMMUNITY): Payer: Self-pay | Admitting: Internal Medicine

## 2017-01-29 ENCOUNTER — Ambulatory Visit (INDEPENDENT_AMBULATORY_CARE_PROVIDER_SITE_OTHER): Payer: Medicare Other | Admitting: *Deleted

## 2017-01-29 DIAGNOSIS — I442 Atrioventricular block, complete: Secondary | ICD-10-CM | POA: Diagnosis not present

## 2017-01-29 LAB — CUP PACEART INCLINIC DEVICE CHECK
Date Time Interrogation Session: 20181031040000
Implantable Lead Implant Date: 19980323
Implantable Lead Location: 753860
Implantable Lead Model: 5034
Implantable Pulse Generator Implant Date: 20181017
Lead Channel Impedance Value: 1275 Ohm
Lead Channel Impedance Value: 473 Ohm
Lead Channel Pacing Threshold Amplitude: 1.1 V
Lead Channel Sensing Intrinsic Amplitude: 0.7 mV
MDC IDC LEAD IMPLANT DT: 19980323
MDC IDC LEAD LOCATION: 753859
MDC IDC MSMT LEADCHNL RV PACING THRESHOLD PULSEWIDTH: 0.4 ms
MDC IDC PG SERIAL: 807281
MDC IDC SET LEADCHNL RA PACING AMPLITUDE: 2.5 V
MDC IDC SET LEADCHNL RV PACING AMPLITUDE: 2.5 V
MDC IDC SET LEADCHNL RV PACING PULSEWIDTH: 0.4 ms
MDC IDC SET LEADCHNL RV SENSING SENSITIVITY: 4 mV

## 2017-01-29 NOTE — Progress Notes (Signed)
Wound check appointment. Steri-strips removed. Wound without redness or edema. Incision edges approximated, wound well healed. Normal device function. Threshold, sensing, and impedances consistent with implant measurements. Device programmed at chronic outputs. Histogram distribution appropriate for patient and level of activity. 13 days of AT/AF~ reviewed with JA~ pt not a candidate for [facility] d/t age. No high ventricular rates noted. Patient educated about wound care, arm mobility. ROV 04/16/2016 w/ JA

## 2017-01-31 ENCOUNTER — Other Ambulatory Visit: Payer: Self-pay

## 2017-01-31 MED ORDER — LEVOTHYROXINE SODIUM 75 MCG PO TABS: 37.5000 ug | ORAL_TABLET | Freq: Every day | ORAL | 0 refills | Status: DC

## 2017-01-31 NOTE — Telephone Encounter (Signed)
Dont see a thyroid level

## 2017-02-03 ENCOUNTER — Other Ambulatory Visit: Payer: Self-pay | Admitting: Family Medicine

## 2017-02-04 ENCOUNTER — Other Ambulatory Visit: Payer: Self-pay | Admitting: *Deleted

## 2017-02-04 MED ORDER — ERGOCALCIFEROL 1.25 MG (50000 UT) PO CAPS: 50000.0000 [IU] | ORAL_CAPSULE | ORAL | 0 refills | Status: DC

## 2017-02-04 NOTE — Telephone Encounter (Signed)
Last seen 09/13/16  Dr Ermalinda MemosBradshaw  don't see a thyroid level in EPIC

## 2017-02-06 ENCOUNTER — Encounter: Payer: Self-pay | Admitting: Family Medicine

## 2017-02-06 ENCOUNTER — Ambulatory Visit (INDEPENDENT_AMBULATORY_CARE_PROVIDER_SITE_OTHER): Payer: Medicare Other | Admitting: Family Medicine

## 2017-02-06 VITALS — BP 129/70 | HR 84 | Temp 97.7°F | Ht 61.0 in | Wt 136.0 lb

## 2017-02-06 DIAGNOSIS — I10 Essential (primary) hypertension: Secondary | ICD-10-CM | POA: Diagnosis not present

## 2017-02-06 DIAGNOSIS — E039 Hypothyroidism, unspecified: Secondary | ICD-10-CM | POA: Diagnosis not present

## 2017-02-06 NOTE — Patient Instructions (Signed)
Great to see you!  Come back in 4 months unless you need us sooner.    

## 2017-02-06 NOTE — Progress Notes (Signed)
   HPI  Patient presents today follow-up chronic medical conditions.  She states that she is doing very well.  She is doing well after her recent pacemaker change.  Symptoms or signs of infection, she is eating and drinking like normal.  Hypothyroidism Patient taking half tablet daily and one whole tablet on Sunday, good medication compliance   PMH: Smoking status noted ROS: Per HPI  Objective: BP 129/70   Pulse 84   Temp 97.7 F (36.5 C) (Oral)   Ht '5\' 1"'$  (1.549 m)   Wt 136 lb (61.7 kg)   BMI 25.70 kg/m  Gen: NAD, alert, cooperative with exam HEENT: NCAT, EOMI, PERRL CV: RRR, good S1/S2, no murmur Resp: CTABL, no wheezes, non-labored Ext: No edema, warm Neuro: Alert and oriented, No gross deficits In Left upper chest with well-healing scar, some residual  Assessment and plan:  #Hypertension Well controlled No changes Repeat labs with slightly low CO2 on last check No signs of infection  #Hypothyroidism  Asymptomatic, clinically well controlled TSH    Orders Placed This Encounter  Procedures  . BMP8+EGFR  . TSH    Laroy Apple, MD Tristan Schroeder [facility] 02/06/2017, 5:20 PM

## 2017-02-07 ENCOUNTER — Encounter: Payer: Self-pay | Admitting: Family Medicine

## 2017-02-07 LAB — BMP8+EGFR
BUN / CREAT RATIO: 15 (ref 12–28)
BUN: 12 mg/dL (ref 10–36)
CO2: 28 mmol/L (ref 20–29)
CREATININE: 0.78 mg/dL (ref 0.57–1.00)
Calcium: 10.1 mg/dL (ref 8.7–10.3)
Chloride: 100 mmol/L (ref 96–106)
GFR, EST AFRICAN AMERICAN: 77 mL/min/{1.73_m2} (ref >59)
GFR, EST NON AFRICAN AMERICAN: 67 mL/min/{1.73_m2} (ref >59)
Glucose: 100 mg/dL — ABNORMAL HIGH (ref 65–99)
POTASSIUM: 4 mmol/L (ref 3.5–5.2)
SODIUM: 143 mmol/L (ref 134–144)

## 2017-02-07 LAB — TSH: TSH: 3.61 u[IU]/mL (ref 0.450–4.500)

## 2017-03-26 DIAGNOSIS — I10 Essential (primary) hypertension: Secondary | ICD-10-CM | POA: Insufficient documentation

## 2017-03-26 DIAGNOSIS — E785 Hyperlipidemia, unspecified: Secondary | ICD-10-CM | POA: Insufficient documentation

## 2017-04-16 ENCOUNTER — Encounter: Payer: Medicare Other | Admitting: Internal Medicine

## 2017-05-07 ENCOUNTER — Encounter: Payer: Self-pay | Admitting: Internal Medicine

## 2017-05-07 ENCOUNTER — Ambulatory Visit: Payer: Medicare HMO | Admitting: Internal Medicine

## 2017-05-07 VITALS — BP 120/70 | HR 73 | Ht 61.0 in | Wt 137.0 lb

## 2017-05-07 DIAGNOSIS — H353211 Exudative age-related macular degeneration, right eye, with active choroidal neovascularization: Secondary | ICD-10-CM | POA: Diagnosis not present

## 2017-05-07 DIAGNOSIS — I480 Paroxysmal atrial fibrillation: Secondary | ICD-10-CM | POA: Diagnosis not present

## 2017-05-07 DIAGNOSIS — Z950 Presence of cardiac pacemaker: Secondary | ICD-10-CM | POA: Diagnosis not present

## 2017-05-07 DIAGNOSIS — H353231 Exudative age-related macular degeneration, bilateral, with active choroidal neovascularization: Secondary | ICD-10-CM | POA: Diagnosis not present

## 2017-05-07 DIAGNOSIS — I442 Atrioventricular block, complete: Secondary | ICD-10-CM | POA: Diagnosis not present

## 2017-05-07 DIAGNOSIS — I10 Essential (primary) hypertension: Secondary | ICD-10-CM | POA: Diagnosis not present

## 2017-05-07 DIAGNOSIS — H353221 Exudative age-related macular degeneration, left eye, with active choroidal neovascularization: Secondary | ICD-10-CM | POA: Diagnosis not present

## 2017-05-07 DIAGNOSIS — H401132 Primary open-angle glaucoma, bilateral, moderate stage: Secondary | ICD-10-CM | POA: Diagnosis not present

## 2017-05-07 LAB — CUP PACEART INCLINIC DEVICE CHECK
Brady Statistic RA Percent Paced: 1 %
Date Time Interrogation Session: 20190206050000
Implantable Lead Implant Date: 19980323
Implantable Lead Implant Date: 19980323
Implantable Lead Location: 753860
Implantable Lead Model: 4524
Implantable Pulse Generator Implant Date: 20181017
Lead Channel Pacing Threshold Amplitude: 1 V
Lead Channel Pacing Threshold Pulse Width: 0.4 ms
Lead Channel Sensing Intrinsic Amplitude: 11.3 mV
Lead Channel Setting Sensing Sensitivity: 4 mV
MDC IDC LEAD LOCATION: 753859
MDC IDC MSMT LEADCHNL RA IMPEDANCE VALUE: 460 Ohm
MDC IDC MSMT LEADCHNL RA SENSING INTR AMPL: 1 mV
MDC IDC MSMT LEADCHNL RV IMPEDANCE VALUE: 1295 Ohm
MDC IDC PG SERIAL: 807281
MDC IDC SET LEADCHNL RV PACING AMPLITUDE: 2.5 V
MDC IDC SET LEADCHNL RV PACING PULSEWIDTH: 0.4 ms
MDC IDC STAT BRADY RV PERCENT PACED: 97 %

## 2017-05-07 MED ORDER — RIVAROXABAN 15 MG PO TABS: 15.0000 mg | ORAL_TABLET | Freq: Every day | ORAL | 11 refills | Status: DC

## 2017-05-07 NOTE — Patient Instructions (Addendum)
Medication Instructions:  Your physician has recommended you make the following change in your medication:   1. STOP: Aspirin  2. START: Xarelto 15 mg daily  Labwork: None ordered  Testing/Procedures: None ordered  Follow-Up: Remote monitoring is used to monitor your Pacemaker from home. This monitoring reduces the number of office visits required to check your device to one time per year. It allows us to keep an eye on the functioning of your device to ensure it is working properly. You are scheduled for a device check from home on 08/06/17. You may send your transmission at any time that day. If you have a wireless device, the transmission will be sent automatically. After your physician reviews your transmission, you will receive a postcard with your next transmission date.    Your physician recommends that you schedule a follow-up appointment in: 4 weeks with Misty StanleyLisa in the Anticoagulation Clinic in [place]  Your physician wants you to follow-up in: 1 year with Dr. [place]Allred [place]. You will receive a reminder letter in the mail two months in advance. If you don't receive a letter, please call our office to schedule the follow-up appointment.    Any Other Special Instructions Will Be Listed Below (If Applicable).     If you need a refill on your cardiac medications before your next appointment, please call your pharmacy.

## 2017-05-07 NOTE — Progress Notes (Signed)
PCP: Elenora Gamma, MD Primary Cardiologist:  Dr Antoine Poche Primary EP:  Dr Zenovia Jordan [name] [name] is a 84 y.o. female who presents today for routine electrophysiology followup.  Since her recent generator change, the patient reports doing very well.  She is in afib today but unaware.  Today, she denies symptoms of palpitations, chest pain, shortness of breath,  lower extremity edema, dizziness, presyncope, or syncope.  The patient is otherwise without complaint today.   Past Medical History:  Diagnosis Date  . Atrioventricular block, complete (HCC) 2006  . Hyperlipidemia   . Hypertension   . Paroxysmal atrial fibrillation (HCC) 2016   single episode detected on PPM interrogation   Past Surgical History:  Procedure Laterality Date  . ABDOMINAL HYSTERECTOMY    . EYE SURGERY Bilateral    cataracts  . GALLBLADDER SURGERY    . Normal coronary angiography  1999  . PPM GENERATOR CHANGEOUT N/A 01/15/2017   Procedure: PPM GENERATOR CHANGEOUT;  Surgeon: Hillis Range, MD;  Location: [facility] INVASIVE CV LAB;  Service: Cardiovascular;  Laterality: N/A;  . PULSE GENERATOR IMPLANT     Guidant Ultra dual-mode, dual-pacing, dual-sensing generator for complete atrioventricular block 2006 with some noise on atrial lead and generator recall for very low incidence of loss of output    ROS- all systems are reviewed and negative except as per HPI above  Current Outpatient Medications  Medication Sig Dispense Refill  . aspirin EC 81 MG tablet Take 1 tablet (81 mg total) by mouth daily.    Marland Kitchen atorvastatin (LIPITOR) 10 MG tablet Take 1 tablet (10 mg total) by mouth every evening. 90 tablet 3  . diltiazem (CARDIZEM CD) 120 MG 24 hr capsule Take 1 capsule (120 mg total) by mouth daily. 30 capsule 5  . ergocalciferol (VITAMIN D2) 50000 units capsule Take 1 capsule (50,000 Units total) once a week by mouth. 12 capsule 0  . hydrochlorothiazide (HYDRODIURIL) 12.5 MG tablet Take 1 tablet (12.5 mg total) by  mouth daily. 90 tablet 3  . latanoprost (XALATAN) 0.005 % ophthalmic solution Place 1 drop into both eyes at bedtime.    Marland Kitchen levothyroxine (SYNTHROID, LEVOTHROID) 75 MCG tablet Take 0.5 tablets (37.5 mcg total) by mouth daily. Except 1 tab ( ) on Sunday. 90 tablet 0  . Multiple Vitamins-Minerals (CENTRUM SILVER 50+WOMEN) TABS Take 1 tablet by mouth daily.     No current facility-administered medications for this visit.     Physical Exam: Vitals:   05/07/17 1215  BP: 120/70  Pulse: 73  Weight: 137 lb (62.1 kg)  Height: 5\' 1"  (1.549 m)    GEN- The patient is well appearing, alert and oriented x 3 today.   Head- normocephalic, atraumatic Eyes-  Sclera clear, conjunctiva pink Ears- hearing intact Oropharynx- clear Lungs- Clear to ausculation bilaterally, normal work of breathing Chest- pacemaker pocket is well healed Heart- Regular rate and rhythm (paced) GI- soft, NT, ND, + BS Extremities- no clubbing, cyanosis, or edema  Pacemaker interrogation- reviewed in detail today,  See PACEART report  ekg tracing ordered today is personally reviewed and shows afib, V paced, frequent PVCs  Assessment and Plan:  1. Symptomatic complete heart block Normal pacemaker function See Pace Art report Will reprogram to VVIR 60 bpm today  2. persistent afib Declined anticoagulation.  We discussed this again today at length.  Her chads2vasc score is 4.  I discussed risks and benefits of anticoagulation at length today.  We discussed coumadin, eliquis, and xarelto as  options.  Risks, benefits, and alternatives to each were discussed.  Her dose of eliquis would be 5mg  BID.  Her CrCl 47.  Her dosing of xarelto would therefore be 15mg  daily.  We have therefore decided to start her on xarelto 15mg  daily.  I have been very clear that she should stop her ASA. Return to see Vashti HeyLisa Reid in the antico[place] clinic [place] in 4 weeks. Persistently in afib since her generator change Will reprogram device  today to VVIR 60 bpm  3. HTN Stable No change required today  lattitude Return to see e in the [place] office in a year  Hillis RangeJames Vianka Ertel MD, [facility] 05/07/2017 12:26 PM

## 2017-06-05 ENCOUNTER — Ambulatory Visit (INDEPENDENT_AMBULATORY_CARE_PROVIDER_SITE_OTHER): Payer: Medicare HMO | Admitting: *Deleted

## 2017-06-05 DIAGNOSIS — I4891 Unspecified atrial fibrillation: Secondary | ICD-10-CM

## 2017-06-05 DIAGNOSIS — Z5181 Encounter for therapeutic drug level monitoring: Secondary | ICD-10-CM

## 2017-06-05 NOTE — Progress Notes (Signed)
Pt was started on Xarelto 15mg  daily for atrial fib on 05/07/17.    Pt denies any symptoms since starting Xarelto.  She has not had any abnormal bruising, bleeding or GI upset.  Reviewed patients medication list.  Pt is not currently on any combined P-gp and strong CYP3A4 inhibitors/inducers (ketoconazole, traconazole, ritonavir, carbamazepine, phenytoin, rifampin, St. John's wort).  Reviewed labs from 02/06/18:  SCr 0.78, Weight 62.7kg, CrCl 46.50.  Dose is appropriate based on CrCl.   Hgb and HCT on 01/03/18 was 14.6/44.4  Gave patient lab orders today for repeat CBC and BMP.  [facility]  (Results:  Hgb 15.0,  Hct 44.8,  plts 225k,  SCr 0.71,  CrCl 51.08)  A full discussion of the nature of anticoagulants has been carried out.  A benefit/risk analysis has been presented to the patient, so that they understand the justification for choosing anticoagulation with Xarelto at this time.  The need for compliance is stressed.  Pt is aware to take the medication once daily with the largest meal of the day.  Side effects of potential bleeding are discussed, including unusual colored urine or stools, coughing up blood or coffee ground emesis, nose bleeds or serious fall or head trauma.  Discussed signs and symptoms of stroke. The patient should avoid any OTC items containing aspirin or ibuprofen.  Avoid alcohol consumption.   Call if any signs of abnormal bleeding.  Discussed financial obligations and resolved any difficulty in obtaining medication.  Next lab test in 6 months.   Called pt with lab results.  F/U appt put in recall per pt request.

## 2017-06-09 ENCOUNTER — Encounter: Payer: Self-pay | Admitting: Family Medicine

## 2017-06-09 ENCOUNTER — Ambulatory Visit (INDEPENDENT_AMBULATORY_CARE_PROVIDER_SITE_OTHER): Payer: Medicare HMO | Admitting: Family Medicine

## 2017-06-09 VITALS — BP 142/72 | HR 60 | Temp 97.0°F | Ht 61.0 in | Wt 138.8 lb

## 2017-06-09 DIAGNOSIS — E039 Hypothyroidism, unspecified: Secondary | ICD-10-CM | POA: Diagnosis not present

## 2017-06-09 DIAGNOSIS — L858 Other specified epidermal thickening: Secondary | ICD-10-CM | POA: Diagnosis not present

## 2017-06-09 DIAGNOSIS — E785 Hyperlipidemia, unspecified: Secondary | ICD-10-CM | POA: Diagnosis not present

## 2017-06-09 DIAGNOSIS — I480 Paroxysmal atrial fibrillation: Secondary | ICD-10-CM | POA: Diagnosis not present

## 2017-06-09 DIAGNOSIS — I10 Essential (primary) hypertension: Secondary | ICD-10-CM

## 2017-06-09 MED ORDER — ATORVASTATIN CALCIUM 10 MG PO TABS: 10.0000 mg | ORAL_TABLET | Freq: Every evening | ORAL | 3 refills | Status: DC

## 2017-06-09 MED ORDER — DILTIAZEM HCL ER COATED BEADS 120 MG PO CP24: 120.0000 mg | ORAL_CAPSULE | Freq: Every day | ORAL | 3 refills | Status: DC

## 2017-06-09 MED ORDER — LEVOTHYROXINE SODIUM 75 MCG PO TABS: 37.5000 ug | ORAL_TABLET | Freq: Every day | ORAL | 3 refills | Status: DC

## 2017-06-09 MED ORDER — HYDROCHLOROTHIAZIDE 12.5 MG PO TABS: 12.5000 mg | ORAL_TABLET | Freq: Every day | ORAL | 3 refills | Status: DC

## 2017-06-09 NOTE — Patient Instructions (Signed)
Great to see you!  Come back to see Dr. Nadine CountsGottschalk in 4 months for follow up

## 2017-06-09 NOTE — Progress Notes (Signed)
   HPI  Patient presents today here for follow-up.  Patient feels well and has no complaints today.  She does have a question about a lesion on her left ear.  It has been there for 3-4 months.  Several years ago she had a similar lesion which fell off. It is nontender, not bleeding.  Good medication compliance with Lipitor  Good medication compliance with Xarelto, no bleeding.  Eyes headaches or chest pain.  PMH: Smoking status noted ROS: Per HPI  Objective: BP (!) 142/72   Pulse 60   Temp (!) 97 F (36.1 C) (Oral)   Ht _0  (1.549 m)   Wt 138 lb 12.8 oz (63 kg)   BMI 26.23 kg/m  Gen: NAD, alert, cooperative with exam HEENT: NCAT CV: RRR, good S1/S2, no murmur Resp: CTABL, no wheezes, non-labored Ext: No edema, warm Neuro: Alert and oriented, No gross deficits Skin:  L ear with soft hyperkeratotic lesion superiorly approximately 4-6 mm tall.  Bleeding, discharge, or tenderness to palpation  Assessment and plan:  #Hypertension Well controlled considering her age. No changes Continue HCTZ  #A. fib Continue Xarelto at reduced dose, continue diltiazem Rate controlled  #Hyperlipidemia On Lipitor, repeat labs  #Hypothyroidism Asymptomatic Labs  Cutaneous horn New lesion left ear Dermatology    Orders Placed This Encounter  Procedures  . CMP14+EGFR  . CBC with Differential/Platelet  . LDL Cholesterol, Direct  . TSH  . Ambulatory referral to Dermatology    Referral Priority:   Routine    Referral Type:   Consultation    Referral Reason:   Specialty Services Required    Requested Specialty:   Dermatology    Number of Visits Requested:   1    Meds ordered this encounter  Medications  . atorvastatin (LIPITOR) 10 MG tablet    Sig: Take 1 tablet (10 mg total) by mouth every evening.    Dispense:  90 tablet    Refill:  3  . diltiazem (CARDIZEM CD) 120 MG 24 hr capsule    Sig: Take 1 capsule (120 mg total) by mouth daily.    Dispense:  90 capsule      Refill:  3  . hydrochlorothiazide (HYDRODIURIL) 12.5 MG tablet    Sig: Take 1 tablet (12.5 mg total) by mouth daily.    Dispense:  90 tablet    Refill:  3  . levothyroxine (SYNTHROID, LEVOTHROID) 75 MCG tablet    Sig: Take 0.5 tablets (37.5 mcg total) by mouth daily. Except 1 tab (65mg) on Sunday.    Dispense:  90 tablet    Refill:  [place] MD [place]3/01/2018, 4:09 PM

## 2017-06-10 LAB — CBC WITH DIFFERENTIAL/PLATELET
Basophils Absolute: 0 10*3/uL (ref 0.0–0.2)
Basos: 0 %
EOS (ABSOLUTE): 0.1 10*3/uL (ref 0.0–0.4)
Eos: 1 %
Hematocrit: 44.8 % (ref 34.0–46.6)
Hemoglobin: 15 g/dL (ref 11.1–15.9)
IMMATURE GRANS (ABS): 0 10*3/uL (ref 0.0–0.1)
IMMATURE GRANULOCYTES: 0 %
LYMPHS: 43 %
Lymphocytes Absolute: 4.3 10*3/uL — ABNORMAL HIGH (ref 0.7–3.1)
MCH: 32.4 pg (ref 26.6–33.0)
MCHC: 33.5 g/dL (ref 31.5–35.7)
MCV: 97 fL (ref 79–97)
MONOS ABS: 0.7 10*3/uL (ref 0.1–0.9)
Monocytes: 6 %
NEUTROS PCT: 50 %
Neutrophils Absolute: 5 10*3/uL (ref 1.4–7.0)
PLATELETS: 225 10*3/uL (ref 150–379)
RBC: 4.63 x10E6/uL (ref 3.77–5.28)
RDW: 14.1 % (ref 12.3–15.4)
WBC: 10.1 10*3/uL (ref 3.4–10.8)

## 2017-06-10 LAB — TSH: TSH: 4.54 u[IU]/mL — ABNORMAL HIGH (ref 0.450–4.500)

## 2017-06-10 LAB — CMP14+EGFR
ALT: 22 IU/L (ref 0–32)
AST: 27 IU/L (ref 0–40)
Albumin/Globulin Ratio: 1.5 (ref 1.2–2.2)
Albumin: 4.6 g/dL (ref 3.2–4.6)
Alkaline Phosphatase: 65 IU/L (ref 39–117)
BUN/Creatinine Ratio: 15 (ref 12–28)
BUN: 11 mg/dL (ref 10–36)
Bilirubin Total: 0.6 mg/dL (ref 0.0–1.2)
CO2: 22 mmol/L (ref 20–29)
Calcium: 10.2 mg/dL (ref 8.7–10.3)
Chloride: 98 mmol/L (ref 96–106)
Creatinine, Ser: 0.71 mg/dL (ref 0.57–1.00)
GFR calc Af Amer: 86 mL/min/1.73
GFR calc non Af Amer: 75 mL/min/1.73
Globulin, Total: 3 g/dL (ref 1.5–4.5)
Glucose: 95 mg/dL (ref 65–99)
Potassium: 4.3 mmol/L (ref 3.5–5.2)
Sodium: 145 mmol/L — ABNORMAL HIGH (ref 134–144)
Total Protein: 7.6 g/dL (ref 6.0–8.5)

## 2017-06-10 LAB — LDL CHOLESTEROL, DIRECT: LDL DIRECT: 121 mg/dL — AB (ref 0–99)

## 2017-06-18 DIAGNOSIS — H353211 Exudative age-related macular degeneration, right eye, with active choroidal neovascularization: Secondary | ICD-10-CM | POA: Diagnosis not present

## 2017-06-18 DIAGNOSIS — H353231 Exudative age-related macular degeneration, bilateral, with active choroidal neovascularization: Secondary | ICD-10-CM | POA: Diagnosis not present

## 2017-06-18 DIAGNOSIS — H401132 Primary open-angle glaucoma, bilateral, moderate stage: Secondary | ICD-10-CM | POA: Diagnosis not present

## 2017-06-18 DIAGNOSIS — H353221 Exudative age-related macular degeneration, left eye, with active choroidal neovascularization: Secondary | ICD-10-CM | POA: Diagnosis not present

## 2017-06-24 ENCOUNTER — Telehealth: Payer: Self-pay | Admitting: Internal Medicine

## 2017-06-24 NOTE — Telephone Encounter (Signed)
Saw that law suit against xalreto  She has no strength doesn't feel up to par since starting medication.

## 2017-07-01 ENCOUNTER — Telehealth: Payer: Self-pay | Admitting: Family Medicine

## 2017-07-01 NOTE — Telephone Encounter (Signed)
Pharmacy and pt notified of RX Verbalize understanding

## 2017-07-01 NOTE — Telephone Encounter (Signed)
Please address

## 2017-07-01 NOTE — Telephone Encounter (Signed)
Patient can certainly take generic synthroid. She is changing due to cost, Rx is not brand specific so should not need new Rx.   Murtis SinkSam Bradshaw, MD [facility] 07/01/2017, 5:13 PM

## 2017-07-02 NOTE — Telephone Encounter (Signed)
I spoke with pt who reports she has been feeling tired. Lack of energy. Some bad dreams and night sweats.  Not sure when this started. Did not discuss with primary care at visit in March.   Has occasional bleeding from one nostril but it stops on its own.  No other signs of bleeding.  I told pt she could try a humidifier in her home to see if this would help. I advised her to discuss tiredness, lack of energy and sweating with primary care.

## 2017-07-02 NOTE — Telephone Encounter (Signed)
[name] [name] called the [facility] today stating that she has not heard from Dr. Johney FrameAllred about [name].

## 2017-07-05 ENCOUNTER — Encounter: Payer: Self-pay | Admitting: Physician Assistant

## 2017-07-05 ENCOUNTER — Ambulatory Visit (INDEPENDENT_AMBULATORY_CARE_PROVIDER_SITE_OTHER): Payer: Medicare HMO | Admitting: Physician Assistant

## 2017-07-05 VITALS — BP 140/72 | HR 58 | Temp 97.1°F | Ht 61.0 in | Wt 139.0 lb

## 2017-07-05 DIAGNOSIS — S3092XA Unspecified superficial injury of abdominal wall, initial encounter: Secondary | ICD-10-CM | POA: Diagnosis not present

## 2017-07-05 DIAGNOSIS — W57XXXA Bitten or stung by nonvenomous insect and other nonvenomous arthropods, initial encounter: Secondary | ICD-10-CM

## 2017-07-05 MED ORDER — DOXYCYCLINE HYCLATE 100 MG PO TABS
100.0000 mg | ORAL_TABLET | Freq: Two times a day (BID) | ORAL | 0 refills | Status: DC
Start: 2017-07-05 — End: 2017-10-10

## 2017-07-06 NOTE — Progress Notes (Signed)
BP 140/72 (BP Location: Right Arm)   Pulse (!) 58   Temp (!) 97.1 F (36.2 C) (Oral)   Ht 5\' 1"  (1.549 m)   Wt 139 lb (63 kg)   BMI 26.26 kg/m    Subjective:    Patient ID: [name] [name], female    DOB: 20-Mar-1925, 84 y.o.   MRN: [id number]  HPI: [name] [name] is a 84 y.o. female presenting on 07/05/2017 for tick bites (found tick wed )  Had a bite and 3 sores that are on her lower abdomen.  She denies and fever or neurological changes.  There is some pain and soreness at the sites.  Past Medical History:  Diagnosis Date  . Atrioventricular block, complete (HCC) 2006  . Hyperlipidemia   . Hypertension   . Paroxysmal atrial fibrillation (HCC) 2016   single episode detected on PPM interrogation   Relevant past medical, surgical, family and social history reviewed and updated as indicated. Interim medical history since our last visit reviewed. Allergies and medications reviewed and updated. DATA REVIEWED: CHART IN EPIC  Family History reviewed for pertinent findings.  Review of Systems  Constitutional: Negative.   HENT: Negative.   Eyes: Negative.   Respiratory: Negative.   Gastrointestinal: Negative.   Genitourinary: Negative.   Skin: Positive for color change and wound.    Allergies as of 07/05/2017   No Known Allergies     Medication List        Accurate as of 07/05/17 11:59 PM. Always use your most recent med list.          atorvastatin 10 MG tablet Commonly known as:  LIPITOR Take 1 tablet (10 mg total) by mouth every evening.   CENTRUM SILVER 50+WOMEN Tabs Take 1 tablet by mouth daily.   diltiazem 120 MG 24 hr capsule Commonly known as:  CARDIZEM CD Take 1 capsule (120 mg total) by mouth daily.   doxycycline 100 MG tablet Commonly known as:  VIBRA-TABS Take 1 tablet (100 mg total) by mouth 2 (two) times daily. 1 po bid   ergocalciferol 50000 units capsule Commonly known as:  VITAMIN D2 Take 1 capsule (50,000 Units total) once a week by mouth.   hydrochlorothiazide 12.5 MG tablet Commonly known as:  HYDRODIURIL Take 1 tablet (12.5 mg total) by mouth daily.   latanoprost 0.005 % ophthalmic solution Commonly known as:  XALATAN Place 1 drop into both eyes at bedtime.   levothyroxine 75 MCG tablet Commonly known as:  SYNTHROID, LEVOTHROID Take 0.5 tablets (37.5 mcg total) by mouth daily. Except 1 tab ( ) on Sunday.   Rivaroxaban 15 MG Tabs tablet Commonly known as:  XARELTO Take 1 tablet (15 mg total) by mouth daily with supper.          Objective:    BP 140/72 (BP Location: Right Arm)   Pulse (!) 58   Temp (!) 97.1 F (36.2 C) (Oral)   Ht 5\' 1"  (1.549 m)   Wt 139 lb (63 kg)   BMI 26.26 kg/m   No Known Allergies  Wt Readings from Last 3 Encounters:  07/05/17 139 lb (63 kg)  06/09/17 138 lb 12.8 oz (63 kg)  05/07/17 137 lb (62.1 kg)    Physical Exam  Skin: Rash noted. Rash is maculopapular. There is erythema.           Assessment & Plan:   1. Tick bite, initial encounter - doxycycline (VIBRA-TABS) 100 MG tablet; Take 1 tablet (100 mg total)  by mouth 2 (two) times daily. 1 po bid  Dispense: 20 tablet; Refill: 0   Continue all other maintenance medications as listed above.  Follow up plan: Return if symptoms worsen or fail to improve.  Educational handout given for survey  Remus LofflerAngel S. Jonny Dearden PA-C [facility] [address]  [place], [place] [address] [phone]   07/06/2017, 10:06 PM

## 2017-07-17 ENCOUNTER — Other Ambulatory Visit: Payer: Self-pay | Admitting: Family Medicine

## 2017-07-28 DIAGNOSIS — H353231 Exudative age-related macular degeneration, bilateral, with active choroidal neovascularization: Secondary | ICD-10-CM | POA: Diagnosis not present

## 2017-08-06 ENCOUNTER — Ambulatory Visit (INDEPENDENT_AMBULATORY_CARE_PROVIDER_SITE_OTHER): Payer: Medicare HMO | Admitting: *Deleted

## 2017-08-06 DIAGNOSIS — I442 Atrioventricular block, complete: Secondary | ICD-10-CM | POA: Diagnosis not present

## 2017-08-06 NOTE — Progress Notes (Signed)
Remote pacemaker transmission.   

## 2017-08-07 ENCOUNTER — Encounter: Payer: Self-pay | Admitting: Cardiology

## 2017-08-22 ENCOUNTER — Telehealth: Payer: Self-pay | Admitting: Family Medicine

## 2017-08-22 LAB — CUP PACEART REMOTE DEVICE CHECK
Battery Remaining Percentage: 100 %
Date Time Interrogation Session: 20190508054100
Implantable Lead Implant Date: 19980323
Implantable Lead Location: 753859
Implantable Lead Location: 753860
Implantable Pulse Generator Implant Date: 20181017
Lead Channel Impedance Value: 414 Ohm
Lead Channel Pacing Threshold Amplitude: 1.3 V
Lead Channel Pacing Threshold Pulse Width: 0.4 ms
Lead Channel Setting Pacing Amplitude: 2.5 V
Lead Channel Setting Sensing Sensitivity: 4 mV
MDC IDC LEAD IMPLANT DT: 19980323
MDC IDC MSMT BATTERY REMAINING LONGEVITY: 180 mo
MDC IDC MSMT LEADCHNL RV IMPEDANCE VALUE: 1319 Ohm
MDC IDC SET LEADCHNL RV PACING PULSEWIDTH: 0.4 ms
MDC IDC STAT BRADY RA PERCENT PACED: 0 %
MDC IDC STAT BRADY RV PERCENT PACED: 98 %
Pulse Gen Serial Number: 807281

## 2017-08-22 MED ORDER — APIXABAN 5 MG PO TABS
5.0000 mg | ORAL_TABLET | Freq: Two times a day (BID) | ORAL | 5 refills | Status: DC
Start: 2017-08-22 — End: 2018-01-13

## 2017-08-22 NOTE — Telephone Encounter (Signed)
Patient aware and verbalizes understanding. 

## 2017-08-22 NOTE — Telephone Encounter (Signed)
Patient complaining of insomnia after taking Xarelto, she would like to change.  Considering her age 84 years old I have dosed Eliquis accordingly, her kidney function is normal and she is greater than 60 kg so she continues to take the normal dose of 5 mg twice daily.  This would be an unusual side effect however it is a safe and easy change.  If it is not covered well then I would recommend that she follow-up with either [place] or cardiology prior to any change.  Simply start eliquis 24 hours after last xarelto tab.   Murtis Sink, MD [facility] 08/22/2017, 10:08 AM

## 2017-08-26 DIAGNOSIS — H353231 Exudative age-related macular degeneration, bilateral, with active choroidal neovascularization: Secondary | ICD-10-CM | POA: Diagnosis not present

## 2017-10-02 ENCOUNTER — Other Ambulatory Visit: Payer: Self-pay | Admitting: Family Medicine

## 2017-10-03 NOTE — Telephone Encounter (Signed)
Last Vit D 05/16/17  37.5

## 2017-10-07 ENCOUNTER — Other Ambulatory Visit: Payer: Self-pay | Admitting: Family Medicine

## 2017-10-08 DIAGNOSIS — H353231 Exudative age-related macular degeneration, bilateral, with active choroidal neovascularization: Secondary | ICD-10-CM | POA: Diagnosis not present

## 2017-10-08 NOTE — Telephone Encounter (Signed)
Last Vit D 05/16/16  37.5

## 2017-10-10 ENCOUNTER — Ambulatory Visit (INDEPENDENT_AMBULATORY_CARE_PROVIDER_SITE_OTHER): Payer: Medicare HMO | Admitting: Family Medicine

## 2017-10-10 ENCOUNTER — Encounter: Payer: Self-pay | Admitting: Family Medicine

## 2017-10-10 VITALS — BP 136/76 | HR 59 | Temp 97.2°F | Ht 61.0 in | Wt 141.0 lb

## 2017-10-10 DIAGNOSIS — E039 Hypothyroidism, unspecified: Secondary | ICD-10-CM

## 2017-10-10 DIAGNOSIS — I480 Paroxysmal atrial fibrillation: Secondary | ICD-10-CM | POA: Diagnosis not present

## 2017-10-10 DIAGNOSIS — I10 Essential (primary) hypertension: Secondary | ICD-10-CM

## 2017-10-10 DIAGNOSIS — E785 Hyperlipidemia, unspecified: Secondary | ICD-10-CM

## 2017-10-10 NOTE — Progress Notes (Signed)
Subjective: CC: HTN, Afib, hypothyroidism PCP: Elenora Gamma, MD ZOX:WRUEAVW [name] [name] is a 84 y.o. female presenting to clinic today for:  1. Hypertension/ HLD Patient reports compliance with Lipitor, Cardizem and hydrochlorothiazide, Side effects: None she reports good control on current regimen.   ROS: Denies headache, dizziness, visual changes, nausea, vomiting, chest pain, LE swelling, abdominal pain or shortness of breath.  2. Afib She states that she has a pacemaker in place for slow heart rate.  Denies any chest pain, heart palpitations, dizziness or loss of consciousness.  She is followed by Dr Johney Frame for her pacemaker.  She reports that she does well on Eliquis.  No problems since discontinuing Xarelto.  Denies prolonged bleeding, blood in her stool or urine.    3.Hypothyroidism Patient denies history of surgery to the neck or radiation to the neck.  She notes that she was simply diagnosed with low thyroid many years ago.  She reports compliance with current dose of Synthroid.  When she was seen by her PCP last visit, TSH was 4.540.  No changes were made given her stability previously on Synthroid 75 mcg daily.  Patient denies any heart palpitations, tremor, change in vision, difficulty swallowing or change in voice.  No constipation or diarrhea.  No skin or hair changes.  She reports good energy.   ROS: Per HPI  No Known Allergies Past Medical History:  Diagnosis Date  . Atrioventricular block, complete (HCC) 2006  . Hyperlipidemia   . Hypertension   . Paroxysmal atrial fibrillation (HCC) 2016   single episode detected on PPM interrogation    Current Outpatient Medications:  .  apixaban (ELIQUIS) 5 MG TABS tablet, Take 1 tablet (5 mg total) by mouth 2 (two) times daily., Disp: 60 tablet, Rfl: 5 .  atorvastatin (LIPITOR) 10 MG tablet, Take 1 tablet (10 mg total) by mouth every evening., Disp: 90 tablet, Rfl: 3 .  diltiazem (CARDIZEM CD) 120 MG 24 hr capsule, Take 1  capsule (120 mg total) by mouth daily., Disp: 90 capsule, Rfl: 3 .  doxycycline (VIBRA-TABS) 100 MG tablet, Take 1 tablet (100 mg total) by mouth 2 (two) times daily. 1 po bid, Disp: 20 tablet, Rfl: 0 .  hydrochlorothiazide (HYDRODIURIL) 12.5 MG tablet, Take 1 tablet (12.5 mg total) by mouth daily., Disp: 90 tablet, Rfl: 3 .  latanoprost (XALATAN) 0.005 % ophthalmic solution, Place 1 drop into both eyes at bedtime., Disp: , Rfl:  .  levothyroxine (SYNTHROID, LEVOTHROID) 75 MCG tablet, Take 0.5 tablets (37.5 mcg total) by mouth daily. Except 1 tab ( ) on Sunday., Disp: 90 tablet, Rfl: 3 .  Multiple Vitamins-Minerals (CENTRUM SILVER 50+WOMEN) TABS, Take 1 tablet by mouth daily., Disp: , Rfl:  .  Vitamin D, Ergocalciferol, (DRISDOL) 50000 units CAPS capsule, TAKE 1 CAPSULE BY MOUTH EACH WEEK, Disp: 12 capsule, Rfl: 0 Social History   Socioeconomic History  . Marital status: Widowed    Spouse name: Not on file  . Number of children: Not on file  . Years of education: Not on file  . Highest education level: Not on file  Occupational History  . Not on file  Social Needs  . Financial resource strain: Not on file  . Food insecurity:    Worry: Not on file    Inability: Not on file  . Transportation needs:    Medical: Not on file    Non-medical: Not on file  Tobacco Use  . Smoking status: Never Smoker  . Smokeless tobacco: Never  Used  Substance and Sexual Activity  . Alcohol use: No    Alcohol/week: 0.0 oz  . Drug use: No  . Sexual activity: Not on file  Lifestyle  . Physical activity:    Days per week: Not on file    Minutes per session: Not on file  . Stress: Not on file  Relationships  . Social connections:    Talks on phone: Not on file    Gets together: Not on file    Attends religious service: Not on file    Active member of club or organization: Not on file    Attends meetings of clubs or organizations: Not on file    Relationship status: Not on file  . Intimate partner  violence:    Fear of current or ex partner: Not on file    Emotionally abused: Not on file    Physically abused: Not on file    Forced sexual activity: Not on file  Other Topics Concern  . Not on file  Social History Narrative  . Not on file   Family History  Problem Relation Age of Onset  . Cancer Sister        breast    Objective: Office vital signs reviewed. BP 136/76   Pulse (!) 59   Temp (!) 97.2 F (36.2 C) (Oral)   Ht 5\' 1"  (1.549 m)   Wt 141 lb (64 kg)   BMI 26.64 kg/m   Physical Examination:  General: Awake, alert, well appearing, well nourished, appears younger than stated age, No acute distress HEENT: Normal    Neck: No masses palpated. No lymphadenopathy; no palpable thyroid masses. No goiter    Ears: Tympanic membranes intact, normal light reflex, no erythema, no bulging    Eyes: PERRLA, extraocular membranes intact, sclera white.  No exophthalmos    Throat: moist mucus membranes Cardio: bradycardic with regular rhythm. S1S2 heard, no murmurs appreciated Pulm: clear to auscultation bilaterally, no wheezes, rhonchi or rales; normal work of breathing on room air Extremities: warm, well perfused, No edema, cyanosis or clubbing; +2 pulses bilaterally MSK: slow gait and normal station Skin: dry; intact; no rashes or lesions Neuro: no resting tremor noted  Assessment/ Plan: 84 y.o. female   1. Essential hypertension Blood pressure well controlled on current regimen.  No changes made.  She has refills through March of next year. - Basic Metabolic Panel  2. Paroxysmal atrial fibrillation (HCC) Bradycardic but has pacemaker.  She follows up with Dr. Johney FrameAllred.  Currently on Eliquis.  Doing well. - Basic Metabolic Panel  3. Hypothyroidism, unspecified type Recheck TSH today.  Given concomitant atrial fibrillation, I favor keeping her on the high end of normal for TSH.  Will contact patient with results once available.  For now, continue current dose of  Synthroid. - Thyroid Panel With TSH  4. Hyperlipidemia, unspecified hyperlipidemia type Continue Lipitor 10 mg.  Lipid panel not due.   Orders Placed This Encounter  Procedures  . Thyroid Panel With TSH  . Basic Metabolic Panel    Raliegh IpAshly M Gottschalk, DO [facility] [phone]

## 2017-10-10 NOTE — Patient Instructions (Addendum)
You had labs performed today.  You will be contacted with the results of the labs once they are available, usually in the next 3 business days for routine lab work.    See me in 3 months to follow up on thyroid or sooner if needed.

## 2017-10-11 ENCOUNTER — Other Ambulatory Visit: Payer: Self-pay | Admitting: Family Medicine

## 2017-10-11 LAB — BASIC METABOLIC PANEL
BUN / CREAT RATIO: 13 (ref 12–28)
BUN: 9 mg/dL — ABNORMAL LOW (ref 10–36)
CO2: 26 mmol/L (ref 20–29)
CREATININE: 0.67 mg/dL (ref 0.57–1.00)
Calcium: 10.2 mg/dL (ref 8.7–10.3)
Chloride: 96 mmol/L (ref 96–106)
GFR calc Af Amer: 89 mL/min/{1.73_m2} (ref >59)
GFR calc non Af Amer: 77 mL/min/{1.73_m2} (ref >59)
GLUCOSE: 62 mg/dL — AB (ref 65–99)
POTASSIUM: 3.7 mmol/L (ref 3.5–5.2)
SODIUM: 143 mmol/L (ref 134–144)

## 2017-10-11 LAB — THYROID PANEL WITH TSH
FREE THYROXINE INDEX: 2.6 (ref 1.2–4.9)
T3 UPTAKE RATIO: 28 % (ref 24–39)
T4 TOTAL: 9.2 ug/dL (ref 4.5–12.0)
TSH: 3.53 u[IU]/mL (ref 0.450–4.500)

## 2017-11-03 ENCOUNTER — Ambulatory Visit (INDEPENDENT_AMBULATORY_CARE_PROVIDER_SITE_OTHER): Payer: Medicare HMO | Admitting: Family Medicine

## 2017-11-03 VITALS — BP 132/78 | HR 62 | Temp 97.6°F | Ht 61.0 in | Wt 143.0 lb

## 2017-11-03 DIAGNOSIS — L720 Epidermal cyst: Secondary | ICD-10-CM

## 2017-11-03 NOTE — Patient Instructions (Signed)
Epidermal Cyst An epidermal cyst is sometimes called an epidermal inclusion cyst or an infundibular cyst. It is a sac made of skin tissue. The sac contains a substance called keratin. Keratin is a protein that is normally secreted through the hair follicles. When keratin becomes trapped in the top layer of skin (epidermis), it can form an epidermal cyst. Epidermal cysts are usually found on the face, neck, trunk, and genitals. These cysts are usually harmless (benign), and they may not cause symptoms unless they become infected. It is important not to pop epidermal cysts yourself. What are the causes? This condition may be caused by:  A blocked hair follicle.  A hair that curls and re-enters the skin instead of growing straight out of the skin (ingrown hair).  A blocked pore.  Irritated skin.  An injury to the skin.  Certain conditions that are passed along from parent to child (inherited).  Human papillomavirus (HPV).  What increases the risk? The following factors may make you more likely to develop an epidermal cyst:  Having acne.  Being overweight.  Wearing tight clothing.  What are the signs or symptoms? The only symptom of this condition may be a small, painless lump underneath the skin. When an epidermal cyst becomes infected, symptoms may include:  Redness.  Inflammation.  Tenderness.  Warmth.  Fever.  Keratin draining from the cyst. Keratin may look like a grayish-white, bad-smelling substance.  Pus draining from the cyst.  How is this diagnosed? This condition is diagnosed with a physical exam. In some cases, you may have a sample of tissue (biopsy) taken from your cyst to be examined under a microscope or tested for bacteria. You may be referred to a health care provider who specializes in skin care (dermatologist). How is this treated? In many cases, epidermal cysts go away on their own without treatment. If a cyst becomes infected, treatment may  include:  Opening and draining the cyst. After draining, minor surgery to remove the rest of the cyst may be done.  Antibiotic medicine to help prevent infection.  Injections of medicines (steroids) that help to reduce inflammation.  Surgery to remove the cyst. Surgery may be done if: ? The cyst becomes large. ? The cyst bothers you. ? There is a chance that the cyst could turn into cancer.  Follow these instructions at home:  Take over-the-counter and prescription medicines only as told by your health care provider.  If you were prescribed an antibiotic, use it as told by your health care provider. Do not stop using the antibiotic even if you start to feel better.  Keep the area around your cyst clean and dry.  Wear loose, dry clothing.  Do not try to pop your cyst.  Avoid touching your cyst.  Check your cyst every day for signs of infection.  Keep all follow-up visits as told by your health care provider. This is important. How is this prevented?  Wear clean, dry, clothing.  Avoid wearing tight clothing.  Keep your skin clean and dry. Shower or take baths every day.  Wash your body with a benzoyl peroxide wash when you shower or bathe. Contact a health care provider if:  Your cyst develops symptoms of infection.  Your condition is not improving or is getting worse.  You develop a cyst that looks different from other cysts you have had.  You have a fever. Get help right away if:  Redness spreads from the cyst into the surrounding area. This information is   not intended to replace advice given to you by your health care provider. Make sure you discuss any questions you have with your health care provider. Document Released: 02/17/2004 Document Revised: 11/15/2015 Document Reviewed: 01/18/2015 Elsevier Interactive Patient Education  2018 [employer]vier Inc[employer]  

## 2017-11-03 NOTE — Progress Notes (Signed)
Subjective: CC: knot on back PCP: Elenora Gamma, MD ZOX:WRUEAVW L Murata is a 84 y.o. female presenting to clinic today for:  1. Knot on back Patient reports that last week she was scratching her back with a back scratcher noticed a lump on her spine.  She denies any tenderness, any redness, any fevers.  She just wanted to get it checked out.   ROS: Per HPI  No Known Allergies Past Medical History:  Diagnosis Date  . Atrioventricular block, complete (HCC) 2006  . Hyperlipidemia   . Hypertension   . Paroxysmal atrial fibrillation (HCC) 2016   single episode detected on PPM interrogation    Current Outpatient Medications:  .  apixaban (ELIQUIS) 5 MG TABS tablet, Take 1 tablet (5 mg total) by mouth 2 (two) times daily., Disp: 60 tablet, Rfl: 5 .  atorvastatin (LIPITOR) 10 MG tablet, Take 1 tablet (10 mg total) by mouth every evening., Disp: 90 tablet, Rfl: 3 .  diltiazem (CARDIZEM CD) 120 MG 24 hr capsule, Take 1 capsule (120 mg total) by mouth daily., Disp: 90 capsule, Rfl: 3 .  hydrochlorothiazide (HYDRODIURIL) 12.5 MG tablet, Take 1 tablet (12.5 mg total) by mouth daily., Disp: 90 tablet, Rfl: 3 .  latanoprost (XALATAN) 0.005 % ophthalmic solution, Place 1 drop into both eyes at bedtime., Disp: , Rfl:  .  levothyroxine (SYNTHROID, LEVOTHROID) 75 MCG tablet, Take 0.5 tablets (37.5 mcg total) by mouth daily. Except 1 tab ( ) on Sunday., Disp: 90 tablet, Rfl: 3 .  Multiple Vitamins-Minerals (CENTRUM SILVER 50+WOMEN) TABS, Take 1 tablet by mouth daily., Disp: , Rfl:  .  Vitamin D, Ergocalciferol, (DRISDOL) 50000 units CAPS capsule, TAKE 1 CAPSULE BY MOUTH EACH WEEK, Disp: 12 capsule, Rfl: 0 Social History   Socioeconomic History  . Marital status: Widowed    Spouse name: Not on file  . Number of children: Not on file  . Years of education: Not on file  . Highest education level: Not on file  Occupational History  . Not on file  Social Needs  . Financial resource  strain: Not on file  . Food insecurity:    Worry: Not on file    Inability: Not on file  . Transportation needs:    Medical: Not on file    Non-medical: Not on file  Tobacco Use  . Smoking status: Never Smoker  . Smokeless tobacco: Never Used  Substance and Sexual Activity  . Alcohol use: No    Alcohol/week: 0.0 oz  . Drug use: No  . Sexual activity: Not on file  Lifestyle  . Physical activity:    Days per week: Not on file    Minutes per session: Not on file  . Stress: Not on file  Relationships  . Social connections:    Talks on phone: Not on file    Gets together: Not on file    Attends religious service: Not on file    Active member of club or organization: Not on file    Attends meetings of clubs or organizations: Not on file    Relationship status: Not on file  . Intimate partner violence:    Fear of current or ex partner: Not on file    Emotionally abused: Not on file    Physically abused: Not on file    Forced sexual activity: Not on file  Other Topics Concern  . Not on file  Social History Narrative  . Not on file   Family History  Problem Relation Age of Onset  . Cancer Sister        breast    Objective: Office vital signs reviewed. BP 132/78   Pulse 62   Temp 97.6 F (36.4 C) (Oral)   Ht 5\' 1"  (1.549 m)   Wt 143 lb (64.9 kg)   BMI 27.02 kg/m   Physical Examination:  General: Awake, alert, well nourished, No acute distress Skin: dry; intact; ~2 cm x 2 cm soft, mobile, fluctuant soft tissue mass consistent with a cyst.  There is a central punctum. Nontender. No associated erythema.  Assessment/ Plan: 84 y.o. female   1. Epidermal cyst Appears to be a benign cyst.  No evidence of secondary infection.  We will continue to monitor closely.  Reasons for reevaluation or urgent evaluation discussed with the patient.  I offered her referral to dermatology or general surgery to have this removed should she like to get rid of it but patient wishes to  hold off on it for now.   Raliegh IpAshly M Jeslin Bazinet, DO [facility] [phone]

## 2017-11-05 ENCOUNTER — Ambulatory Visit (INDEPENDENT_AMBULATORY_CARE_PROVIDER_SITE_OTHER): Payer: Medicare HMO | Admitting: *Deleted

## 2017-11-05 DIAGNOSIS — I4891 Unspecified atrial fibrillation: Secondary | ICD-10-CM

## 2017-11-06 NOTE — Progress Notes (Signed)
Remote pacemaker transmission.   

## 2017-11-11 DIAGNOSIS — H353231 Exudative age-related macular degeneration, bilateral, with active choroidal neovascularization: Secondary | ICD-10-CM | POA: Diagnosis not present

## 2017-11-26 LAB — CUP PACEART REMOTE DEVICE CHECK
Implantable Lead Implant Date: 19980323
Implantable Lead Location: 753860
Implantable Lead Model: 5034
Implantable Pulse Generator Implant Date: 20181017
MDC IDC LEAD IMPLANT DT: 19980323
MDC IDC LEAD LOCATION: 753859
MDC IDC SESS DTM: 20190828213838
Pulse Gen Serial Number: 807281

## 2017-12-16 ENCOUNTER — Encounter: Payer: Self-pay | Admitting: Family Medicine

## 2017-12-16 ENCOUNTER — Ambulatory Visit (INDEPENDENT_AMBULATORY_CARE_PROVIDER_SITE_OTHER): Payer: Medicare HMO | Admitting: Family Medicine

## 2017-12-16 VITALS — BP 136/70 | HR 61 | Temp 97.6°F | Ht 61.0 in | Wt 136.0 lb

## 2017-12-16 DIAGNOSIS — L720 Epidermal cyst: Secondary | ICD-10-CM | POA: Diagnosis not present

## 2017-12-16 DIAGNOSIS — L089 Local infection of the skin and subcutaneous tissue, unspecified: Secondary | ICD-10-CM | POA: Diagnosis not present

## 2017-12-16 MED ORDER — CEPHALEXIN 500 MG PO CAPS: 500.0000 mg | ORAL_CAPSULE | Freq: Two times a day (BID) | ORAL | 0 refills | Status: AC

## 2017-12-16 NOTE — Patient Instructions (Signed)
Incision and Drainage, Care After Refer to this sheet in the next few weeks. These instructions provide you with information about caring for yourself after your procedure. Your health care provider may also give you more specific instructions. Your treatment has been planned according to current medical practices, but problems sometimes occur. Call your health care provider if you have any problems or questions after your procedure. What can I expect after the procedure? After the procedure, it is common to have:  Pain or discomfort around your incision site.  Drainage from your incision.  Follow these instructions at home:  Take over-the-counter and prescription medicines only as told by your health care provider.  If you were prescribed an antibiotic medicine, take it as told by your health care provider.Do not stop taking the antibiotic even if you start to feel better.  Followinstructions from your health care provider about: ? How to take care of your incision. ? When and how you should change your packing and bandage (dressing). Wash your hands with soap and water before you change your dressing. If soap and water are not available, use hand sanitizer. ? When you should remove your dressing.  Do not take baths, swim, or use a hot tub until your health care provider approves.  Keep all follow-up visits as told by your health care provider. This is important.  Check your incision area every day for signs of infection. Check for: ? More redness, swelling, or pain. ? More fluid or blood. ? Warmth. ? Pus or a bad smell. Contact a health care provider if:  Your cyst or abscess returns.  You have a fever.  You have more redness, swelling, or pain around your incision.  You have more fluid or blood coming from your incision.  Your incision feels warm to the touch.  You have pus or a bad smell coming from your incision. Get help right away if:  You have severe pain or  bleeding.  You cannot eat or drink without vomiting.  You have decreased urine output.  You become short of breath.  You have chest pain.  You cough up blood.  The area where the incision and drainage occurred becomes numb or it tingles. This information is not intended to replace advice given to you by your health care provider. Make sure you discuss any questions you have with your health care provider. Document Released: 06/10/2011 Document Revised: 08/18/2015 Document Reviewed: 01/06/2015 Elsevier Interactive Patient Education  2018 Elsevier Inc[employer]  

## 2017-12-16 NOTE — Progress Notes (Signed)
Subjective: CC: Epidermal cyst PCP: Elenora GammaBradshaw, Samuel L, MD BJY:[id number]:[name] [name] is a 84 y.o. female presenting to clinic today for:  1.  Epidermal cyst Patient was seen 1 month ago for epidermal cyst.  At that time, it did not have any evidence of infection.  She notes that over the last week, the cyst has been getting larger and has become somewhat tender, particularly with lying on it.  She notes some redness and what appears to be a pimple at the top of the cyst which was not present before.  Denies any fevers, chills, nausea or vomiting.  She has not been doing anything for symptoms so far.   ROS: Per HPI  No Known Allergies Past Medical History:  Diagnosis Date  . Atrioventricular block, complete (HCC) 2006  . Hyperlipidemia   . Hypertension   . Paroxysmal atrial fibrillation (HCC) 2016   single episode detected on PPM interrogation    Current Outpatient Medications:  .  apixaban (ELIQUIS) 5 MG TABS tablet, Take 1 tablet (5 mg total) by mouth 2 (two) times daily., Disp: 60 tablet, Rfl: 5 .  atorvastatin (LIPITOR) 10 MG tablet, Take 1 tablet (10 mg total) by mouth every evening., Disp: 90 tablet, Rfl: 3 .  diltiazem (CARDIZEM CD) 120 MG 24 hr capsule, Take 1 capsule (120 mg total) by mouth daily., Disp: 90 capsule, Rfl: 3 .  hydrochlorothiazide (HYDRODIURIL) 12.5 MG tablet, Take 1 tablet (12.5 mg total) by mouth daily., Disp: 90 tablet, Rfl: 3 .  latanoprost (XALATAN) 0.005 % ophthalmic solution, Place 1 drop into both eyes at bedtime., Disp: , Rfl:  .  levothyroxine (SYNTHROID, LEVOTHROID) 75 MCG tablet, Take 0.5 tablets (37.5 mcg total) by mouth daily. Except 1 tab (75mcg) on Sunday., Disp: 90 tablet, Rfl: 3 .  Multiple Vitamins-Minerals (CENTRUM SILVER 50+WOMEN) TABS, Take 1 tablet by mouth daily., Disp: , Rfl:  .  Vitamin D, Ergocalciferol, (DRISDOL) 50000 units CAPS capsule, TAKE 1 CAPSULE BY MOUTH EACH WEEK, Disp: 12 capsule, Rfl: 0 Social History   Socioeconomic History    . Marital status: Widowed    Spouse name: Not on file  . Number of children: Not on file  . Years of education: Not on file  . Highest education level: Not on file  Occupational History  . Not on file  Social Needs  . Financial resource strain: Not on file  . Food insecurity:    Worry: Not on file    Inability: Not on file  . Transportation needs:    Medical: Not on file    Non-medical: Not on file  Tobacco Use  . Smoking status: Never Smoker  . Smokeless tobacco: Never Used  Substance and Sexual Activity  . Alcohol use: No    Alcohol/week: 0.0 standard drinks  . Drug use: No  . Sexual activity: Not on file  Lifestyle  . Physical activity:    Days per week: Not on file    Minutes per session: Not on file  . Stress: Not on file  Relationships  . Social connections:    Talks on phone: Not on file    Gets together: Not on file    Attends religious service: Not on file    Active member of club or organization: Not on file    Attends meetings of clubs or organizations: Not on file    Relationship status: Not on file  . Intimate partner violence:    Fear of current or ex partner: Not  on file    Emotionally abused: Not on file    Physically abused: Not on file    Forced sexual activity: Not on file  Other Topics Concern  . Not on file  Social History Narrative  . Not on file   Family History  Problem Relation Age of Onset  . Cancer Sister        breast    Objective: Office vital signs reviewed. BP 136/70   Pulse 61   Temp 97.6 F (36.4 C) (Oral)   Ht 5\' 1"  (1.549 m)   Wt 136 lb (61.7 kg)   BMI 25.70 kg/m   Physical Examination:  General: Awake, alert, well nourished, well appearing. No acute distress Skin: 2 "x 2" area of induration in the left upper back.  She has a 1 inch area of erythema with a central pustule.  There is increased warmth.  Mild tenderness to palpation.  No palpable fluctuance.  Lesion is not actively draining.  Procedure Incision and  Drainage: Denies allergy to lidocaine, epinephrine, iodine/Betadine.  She is currently treated with Eliquis for atrial fibrillation.  Informed consent provided.  Written consent obtained and scanned into the chart.  Appropriate timeout performed.  Indication : Infected epidermal cyst.  Area was cleaned x3 with rubbing alcohol.  Local anesthesia achieved with ethyl chloride spray.  Area was cleaned with Betadine swab x3.  A stab incision using a a 23-gauge hypodermic needle.  Mild amounts of purulent discharge from lesion obtained.  Area was milked until no purulent material expressed.  Less than 1  cc of blood loss.  Hemostasis achieved with pressure.  Area was cleaned, topical antibiotic applied, sterile dressing applied.  Patient tolerated procedure well.  Home care instructions were reviewed with the patient.  Handout was provided.   Assessment/ Plan: 84 y.o. female   1. Infected epidermoid cyst Patient afebrile nontoxic-appearing.  What was a benign-appearing epidermal cyst at last visit has now become infected.  This was treated with incision and drainage today.  Patient tolerated procedure well.  She was also placed on oral Keflex to take twice daily for the next 10 days.  Reasons for return evaluation discussed with the patient she voiced good understanding and will follow-up as scheduled. - Ambulatory referral to General Surgery   Meds ordered this encounter  Medications  . cephALEXin (KEFLEX) 500 MG capsule    Sig: Take 1 capsule (500 mg total) by mouth 2 (two) times daily for 10 days.    Dispense:  20 capsule    Refill:  0     Hristopher Missildine Hulen Skains, DO [facility] [phone]

## 2017-12-23 ENCOUNTER — Ambulatory Visit (INDEPENDENT_AMBULATORY_CARE_PROVIDER_SITE_OTHER): Payer: Medicare HMO | Admitting: Family Medicine

## 2017-12-23 VITALS — BP 140/71 | HR 60 | Temp 96.6°F | Ht 61.0 in | Wt 144.6 lb

## 2017-12-23 DIAGNOSIS — L089 Local infection of the skin and subcutaneous tissue, unspecified: Secondary | ICD-10-CM

## 2017-12-23 DIAGNOSIS — L729 Follicular cyst of the skin and subcutaneous tissue, unspecified: Secondary | ICD-10-CM

## 2017-12-23 MED ORDER — DOXYCYCLINE HYCLATE 100 MG PO TABS: 100.0000 mg | ORAL_TABLET | Freq: Two times a day (BID) | ORAL | 0 refills | Status: DC

## 2017-12-23 NOTE — Patient Instructions (Signed)
I am adding doxycycline for you to take twice a day in addition to the cephalexin you are taking. Make sure you take this with food otherwise her stomach will be upset.

## 2017-12-23 NOTE — Progress Notes (Signed)
Subjective: CC: 1 week f/u infected cyst PCP: Elenora GammaBradshaw, Samuel L, MD GEX:[id number]:[name] [name] is a 84 y.o. female presenting to clinic today for:  1. Infected cyst Patient presents for one-week follow-up on an infected epidermoid cyst.  She had about a 2 inch x 2 inch area of induration with an approximately 1 inch area of erythema with central pustule at last visit.  She reports that she continues to have intermittent pains at the site.  She reports compliance with the Keflex that she has been prescribed and does feel like it is getting slightly better but has not resolved.  She did receive a call for an appointment next week with the general surgeon and she is wondering if she should keep this.  She does quite a bit of drainage from lesion.   ROS: Per HPI  No Known Allergies Past Medical History:  Diagnosis Date  . Atrioventricular block, complete (HCC) 2006  . Hyperlipidemia   . Hypertension   . Paroxysmal atrial fibrillation (HCC) 2016   single episode detected on PPM interrogation    Current Outpatient Medications:  .  apixaban (ELIQUIS) 5 MG TABS tablet, Take 1 tablet (5 mg total) by mouth 2 (two) times daily., Disp: 60 tablet, Rfl: 5 .  atorvastatin (LIPITOR) 10 MG tablet, Take 1 tablet (10 mg total) by mouth every evening., Disp: 90 tablet, Rfl: 3 .  cephALEXin (KEFLEX) 500 MG capsule, Take 1 capsule (500 mg total) by mouth 2 (two) times daily for 10 days., Disp: 20 capsule, Rfl: 0 .  diltiazem (CARDIZEM CD) 120 MG 24 hr capsule, Take 1 capsule (120 mg total) by mouth daily., Disp: 90 capsule, Rfl: 3 .  hydrochlorothiazide (HYDRODIURIL) 12.5 MG tablet, Take 1 tablet (12.5 mg total) by mouth daily., Disp: 90 tablet, Rfl: 3 .  latanoprost (XALATAN) 0.005 % ophthalmic solution, Place 1 drop into both eyes at bedtime., Disp: , Rfl:  .  levothyroxine (SYNTHROID, LEVOTHROID) 75 MCG tablet, Take 0.5 tablets (37.5 mcg total) by mouth daily. Except 1 tab (75mcg) on Sunday., Disp: 90 tablet,  Rfl: 3 .  Multiple Vitamins-Minerals (CENTRUM SILVER 50+WOMEN) TABS, Take 1 tablet by mouth daily., Disp: , Rfl:  .  Vitamin D, Ergocalciferol, (DRISDOL) 50000 units CAPS capsule, TAKE 1 CAPSULE BY MOUTH EACH WEEK, Disp: 12 capsule, Rfl: 0 .  doxycycline (VIBRA-TABS) 100 MG tablet, Take 1 tablet (100 mg total) by mouth 2 (two) times daily., Disp: 20 tablet, Rfl: 0 Social History   Socioeconomic History  . Marital status: Widowed    Spouse name: Not on file  . Number of children: Not on file  . Years of education: Not on file  . Highest education level: Not on file  Occupational History  . Not on file  Social Needs  . Financial resource strain: Not on file  . Food insecurity:    Worry: Not on file    Inability: Not on file  . Transportation needs:    Medical: Not on file    Non-medical: Not on file  Tobacco Use  . Smoking status: Never Smoker  . Smokeless tobacco: Never Used  Substance and Sexual Activity  . Alcohol use: No    Alcohol/week: 0.0 standard drinks  . Drug use: No  . Sexual activity: Not on file  Lifestyle  . Physical activity:    Days per week: Not on file    Minutes per session: Not on file  . Stress: Not on file  Relationships  . Social  connections:    Talks on phone: Not on file    Gets together: Not on file    Attends religious service: Not on file    Active member of club or organization: Not on file    Attends meetings of clubs or organizations: Not on file    Relationship status: Not on file  . Intimate partner violence:    Fear of current or ex partner: Not on file    Emotionally abused: Not on file    Physically abused: Not on file    Forced sexual activity: Not on file  Other Topics Concern  . Not on file  Social History Narrative  . Not on file   Family History  Problem Relation Age of Onset  . Cancer Sister        breast    Objective: Office vital signs reviewed. BP 140/71 (BP Location: Left Arm, Cuff Size: Normal)   Pulse 60    Temp (!) 96.6 F (35.9 C) (Oral)   Ht 5\' 1"  (1.549 m)   Wt 144 lb 9.6 oz (65.6 kg)   BMI 27.32 kg/m   Physical Examination:  General: Awake, alert, well nourished, nontoxic. No acute distress Skin: continues to have about 2"x2" area of induration.  Area of erythema slightly improved.  She is having active purulent drainage.  Lesion is tender.  Assessment/ Plan: 84 y.o. female   1. Infected cyst of skin Infection has not improved as much as I had hoped.  We will add doxycycline p.o. twice daily.  I informed her that she should take this with food.  Keep appointment with general surgery, she will likely need the cyst excised.  Patient aware of reasons for return evaluation.  She will follow-up PRN.   No orders of the defined types were placed in this encounter.  Meds ordered this encounter  Medications  . doxycycline (VIBRA-TABS) 100 MG tablet    Sig: Take 1 tablet (100 mg total) by mouth 2 (two) times daily.    Dispense:  20 tablet    Refill:  0     Eraina Winnie Hulen Skains, DO [facility] [phone]

## 2017-12-24 DIAGNOSIS — H401132 Primary open-angle glaucoma, bilateral, moderate stage: Secondary | ICD-10-CM | POA: Diagnosis not present

## 2017-12-24 DIAGNOSIS — H353231 Exudative age-related macular degeneration, bilateral, with active choroidal neovascularization: Secondary | ICD-10-CM | POA: Diagnosis not present

## 2017-12-26 ENCOUNTER — Other Ambulatory Visit: Payer: Self-pay | Admitting: *Deleted

## 2017-12-30 ENCOUNTER — Ambulatory Visit (INDEPENDENT_AMBULATORY_CARE_PROVIDER_SITE_OTHER): Payer: Medicare HMO | Admitting: *Deleted

## 2017-12-30 DIAGNOSIS — Z5181 Encounter for therapeutic drug level monitoring: Secondary | ICD-10-CM | POA: Diagnosis not present

## 2017-12-30 DIAGNOSIS — I480 Paroxysmal atrial fibrillation: Secondary | ICD-10-CM

## 2017-12-30 NOTE — Progress Notes (Addendum)
Pt was changed to Eliquis 5mg  twice daily by PCP on 08/22/17 after complaining of insomnia after taking Xarelto. Xarelto was stopped.    Pt denies any symptoms since starting Eliquis.  She has not had any abnormal bruising, bleeding or GI upset.  States she has not not had the effects of insomnia with Eliquis like she did Xarelto.  Reviewed patients medication list.  Pt is not currently on any combined P-gp and strong CYP3A4 inhibitors/inducers (ketoconazole, traconazole, ritonavir, carbamazepine, phenytoin, rifampin, St. John's wort).  Reviewed labs from [place] on 01/02/18:  SCr 0.66, Weight 63.5kg, CrCl 55.66     Dose is appropriate based on age, weight and SCr.   Hgb and HCT: 14.2/41.9  Plts: 237     A full discussion of the nature of anticoagulants has been carried out.  A benefit/risk analysis has been presented to the patient, so that they understand the justification for choosing anticoagulation with Eliquis at this time.  The need for compliance is stressed.  Pt is aware to take the medication once daily with the largest meal of the day.  Side effects of potential bleeding are discussed, including unusual colored urine or stools, coughing up blood or coffee ground emesis, nose bleeds or serious fall or head trauma.  Discussed signs and symptoms of stroke. The patient should avoid any OTC items containing aspirin or ibuprofen.  Avoid alcohol consumption.   Call if any signs of abnormal bleeding.  Discussed financial obligations and resolved any difficulty in obtaining medication.  Next lab test in 6 months.   Pt notified of lab results by [facility] LPN.  F/U appt put in recall per pt request.

## 2018-01-01 ENCOUNTER — Encounter: Payer: Self-pay | Admitting: General Surgery

## 2018-01-01 ENCOUNTER — Ambulatory Visit: Payer: Medicare HMO | Admitting: General Surgery

## 2018-01-01 VITALS — BP 160/75 | HR 61 | Temp 97.5°F | Resp 16 | Wt 140.0 lb

## 2018-01-01 DIAGNOSIS — L723 Sebaceous cyst: Secondary | ICD-10-CM | POA: Diagnosis not present

## 2018-01-01 DIAGNOSIS — L089 Local infection of the skin and subcutaneous tissue, unspecified: Secondary | ICD-10-CM | POA: Diagnosis not present

## 2018-01-01 NOTE — Progress Notes (Signed)
[name] [name]; [id number]; 05/17/1924   HPI Patient is a 84 year old white female who was referred to my care by Dr. Ermalinda Memos for evaluation treatment of an infected sebaceous cyst on her back.  Is been present for several weeks.  She has been on antibiotics in the past.  She is chronically anticoagulated on Eliquis.  She states that it is only tender when pressure is applied.  There has been some drainage and she is keeping it covered.  She is currently not on an antibiotic.  She currently has 0 out of 10 pain. Past Medical History:  Diagnosis Date  . Atrioventricular block, complete (HCC) 2006  . Hyperlipidemia   . Hypertension   . Paroxysmal atrial fibrillation (HCC) 2016   single episode detected on PPM interrogation    Past Surgical History:  Procedure Laterality Date  . ABDOMINAL HYSTERECTOMY    . EYE SURGERY Bilateral    cataracts  . GALLBLADDER SURGERY    . Normal coronary angiography  1999  . PPM GENERATOR CHANGEOUT N/A 01/15/2017   [employer] Accolade MRI EL DL pacemaker for CHB by Dr Johney Frame  . PULSE GENERATOR IMPLANT     Guidant Ultra dual-mode, dual-pacing, dual-sensing generator for complete atrioventricular block 2006 with some noise on atrial lead and generator recall for very low incidence of loss of output    Family History  Problem Relation Age of Onset  . Cancer Sister        breast    Current Outpatient Medications on File Prior to Visit  Medication Sig Dispense Refill  . atorvastatin (LIPITOR) 10 MG tablet Take 1 tablet (10 mg total) by mouth every evening. 90 tablet 3  . diltiazem (CARDIZEM CD) 120 MG 24 hr capsule Take 1 capsule (120 mg total) by mouth daily. 90 capsule 3  . doxycycline (VIBRA-TABS) 100 MG tablet Take 1 tablet (100 mg total) by mouth 2 (two) times daily. 20 tablet 0  . hydrochlorothiazide (HYDRODIURIL) 12.5 MG tablet Take 1 tablet (12.5 mg total) by mouth daily. 90 tablet 3  . latanoprost (XALATAN) 0.005 % ophthalmic solution Place  1 drop into both eyes at bedtime.    Marland Kitchen levothyroxine (SYNTHROID, LEVOTHROID) 75 MCG tablet Take 0.5 tablets (37.5 mcg total) by mouth daily. Except 1 tab ( ) on Sunday. 90 tablet 3  . Multiple Vitamins-Minerals (CENTRUM SILVER 50+WOMEN) TABS Take 1 tablet by mouth daily.    . Vitamin D, Ergocalciferol, (DRISDOL) 50000 units CAPS capsule TAKE 1 CAPSULE BY MOUTH EACH WEEK 12 capsule 0  . apixaban (ELIQUIS) 5 MG TABS tablet Take 1 tablet (5 mg total) by mouth 2 (two) times daily. (Patient not taking: Reported on 01/01/2018) 60 tablet 5   No current facility-administered medications on file prior to visit.     No Known Allergies  Social History   Substance and Sexual Activity  Alcohol Use No  . Alcohol/week: 0.0 standard drinks    Social History   Tobacco Use  Smoking Status Never Smoker  Smokeless Tobacco Never Used    Review of Systems  Constitutional: Negative.   HENT: Negative.   Eyes: Negative.   Respiratory: Negative.   Cardiovascular: Negative.   Gastrointestinal: Negative.   Genitourinary: Negative.   Musculoskeletal: Negative.   Skin: Negative.   Neurological: Negative.   Endo/Heme/Allergies: Negative.   Psychiatric/Behavioral: Negative.     Objective   Vitals:   01/01/18 1017  BP: (!) 160/75  Pulse: 61  Resp: 16  Temp: (!) 97.5 F (36.4 C)  Physical Exam  Constitutional: She is oriented to person, place, and time. She appears well-developed and well-nourished. No distress.  HENT:  Head: Normocephalic and atraumatic.  Cardiovascular: Normal rate, regular rhythm and normal heart sounds. Exam reveals no gallop and no friction rub.  No murmur heard. Pulmonary/Chest: Effort normal and breath sounds normal. No stridor. No respiratory distress. She has no wheezes. She has no rales.  Neurological: She is alert and oriented to person, place, and time.  Skin: Skin is warm and dry. There is erythema.  Inflamed sebaceous cyst on mid upper back with multiple  punctum is present.  I was able to express sebum and a small amount of purulent fluid.  It is erythematous around this region, but there is no extension of the cellulitis.  Vitals reviewed.  Office notes reviewed Assessment  Infected sebaceous cyst, back, slowly resolving Plan   As I was able to express sebum from the wound, no need for surgical intervention at this time.  Surgery intervention will be complicated due to her chronic anticoagulation.  As the inflammation seems to be localized, I do not think another round of antibiotics is warranted at this time.  I will see her in 1 week for follow-up wound check.  She understands this and agrees.

## 2018-01-02 ENCOUNTER — Other Ambulatory Visit: Payer: Self-pay | Admitting: *Deleted

## 2018-01-02 ENCOUNTER — Other Ambulatory Visit: Payer: Medicare HMO

## 2018-01-02 DIAGNOSIS — I480 Paroxysmal atrial fibrillation: Secondary | ICD-10-CM | POA: Diagnosis not present

## 2018-01-03 LAB — BASIC METABOLIC PANEL
BUN/Creatinine Ratio: 11 — ABNORMAL LOW (ref 12–28)
BUN: 7 mg/dL — AB (ref 10–36)
CALCIUM: 10 mg/dL (ref 8.7–10.3)
CO2: 25 mmol/L (ref 20–29)
Chloride: 98 mmol/L (ref 96–106)
Creatinine, Ser: 0.66 mg/dL (ref 0.57–1.00)
GFR calc Af Amer: 89 mL/min/{1.73_m2} (ref >59)
GFR, EST NON AFRICAN AMERICAN: 77 mL/min/{1.73_m2} (ref >59)
GLUCOSE: 133 mg/dL — AB (ref 65–99)
POTASSIUM: 3.8 mmol/L (ref 3.5–5.2)
Sodium: 138 mmol/L (ref 134–144)

## 2018-01-03 LAB — CBC
HEMOGLOBIN: 14.2 g/dL (ref 11.1–15.9)
Hematocrit: 41.9 % (ref 34.0–46.6)
MCH: 32.6 pg (ref 26.6–33.0)
MCHC: 33.9 g/dL (ref 31.5–35.7)
MCV: 96 fL (ref 79–97)
PLATELETS: 237 10*3/uL (ref 150–450)
RBC: 4.35 x10E6/uL (ref 3.77–5.28)
RDW: 12.1 % — ABNORMAL LOW (ref 12.3–15.4)
WBC: 7.4 10*3/uL (ref 3.4–10.8)

## 2018-01-08 ENCOUNTER — Telehealth: Payer: Self-pay | Admitting: *Deleted

## 2018-01-08 ENCOUNTER — Encounter: Payer: Self-pay | Admitting: General Surgery

## 2018-01-08 ENCOUNTER — Ambulatory Visit: Payer: Medicare HMO | Admitting: General Surgery

## 2018-01-08 VITALS — BP 159/75 | HR 61 | Temp 97.7°F | Resp 20 | Wt 142.0 lb

## 2018-01-08 DIAGNOSIS — L723 Sebaceous cyst: Secondary | ICD-10-CM | POA: Diagnosis not present

## 2018-01-08 DIAGNOSIS — L089 Local infection of the skin and subcutaneous tissue, unspecified: Secondary | ICD-10-CM | POA: Diagnosis not present

## 2018-01-08 NOTE — Telephone Encounter (Signed)
Notes recorded by Lesle Chris, LPN on 60/45/4098 at 4:53 PM EDT Patient notified. Copy to pmd. ------  Notes recorded by Hillis Range, MD on 01/04/2018 at 8:25 PM EDT Results reviewed. Boneta Lucks, please inform pt of result. I will route to primary care also.

## 2018-01-08 NOTE — Progress Notes (Signed)
Subjective:     [name] [name]  Here for follow-up of infected sebaceous cyst on her back.  Patient states that the drainage has decreased recently.  Denies any fever. Objective:    BP (!) 159/75 (BP Location: Left Arm, Patient Position: Sitting, Cuff Size: Normal)   Pulse 61   Temp 97.7 F (36.5 C) (Temporal)   Resp 20   Wt 142 lb (64.4 kg)   BMI 26.83 kg/m   General:  alert, cooperative and no distress  Back with 2 punctums noted.  Decreased erythema noted.  Decreased induration noted.  Still was able to express some sebum.  No purulent drainage noted.     Assessment:    Infected sebaceous cyst, back, resolving    Plan:   Follow-up in 2 weeks for wound check.

## 2018-01-13 ENCOUNTER — Encounter: Payer: Self-pay | Admitting: Family Medicine

## 2018-01-13 ENCOUNTER — Ambulatory Visit (INDEPENDENT_AMBULATORY_CARE_PROVIDER_SITE_OTHER): Payer: Medicare HMO | Admitting: Family Medicine

## 2018-01-13 VITALS — BP 138/79 | HR 59 | Temp 97.3°F | Ht 61.0 in | Wt 141.0 lb

## 2018-01-13 DIAGNOSIS — L723 Sebaceous cyst: Secondary | ICD-10-CM

## 2018-01-13 DIAGNOSIS — I10 Essential (primary) hypertension: Secondary | ICD-10-CM | POA: Diagnosis not present

## 2018-01-13 DIAGNOSIS — I480 Paroxysmal atrial fibrillation: Secondary | ICD-10-CM | POA: Diagnosis not present

## 2018-01-13 DIAGNOSIS — L089 Local infection of the skin and subcutaneous tissue, unspecified: Secondary | ICD-10-CM

## 2018-01-13 DIAGNOSIS — E034 Atrophy of thyroid (acquired): Secondary | ICD-10-CM

## 2018-01-13 DIAGNOSIS — Z23 Encounter for immunization: Secondary | ICD-10-CM

## 2018-01-13 MED ORDER — APIXABAN 5 MG PO TABS: 5.0000 mg | ORAL_TABLET | Freq: Two times a day (BID) | ORAL | 5 refills | Status: DC

## 2018-01-13 NOTE — Progress Notes (Signed)
Subjective: CC: HTN, AFib, Hypothyroidism PCP: Raliegh Ip, DO ZOX:[id number] [name] [name] is a 84 y.o. female presenting to clinic today for:  1. HTN/ Afib Patient reports compliance with her Cardizem 120 mg daily.  She has hydrochlorothiazide 12.5 mg as well daily.  She takes the Eliquis 5 mg p.o. twice daily.  No problems with medication compliance.  Denies any abnormal bleeding, including vaginal bleeding, hematuria, hematochezia, melena.  She had one episode of epistaxis but this was after she "blew her nose hard".  This resolved after about 1 minute.  No chest pain, shortness of breath, lower extremity edema, dizziness or visual disturbance.  2.  Hypothyroidism Patient with long-standing history of hypothyroidism.  Again, no history of radiation to the neck.  No history of neck surgeries.  She reports compliance with current dose of Synthroid.  Denies any heart palpitations, changes in energy, insomnia, constipation, diarrhea, heat or cold intolerance.  No change in voice or difficulty swallowing.  No visual disturbance.  3.  Infected sebaceous cyst Patient currently under the care of general surgery and is being seen every 2 weeks for interval checks.  She notes that she completed the antibiotic course and no extended antibiotics were recommended.  She has been expressed x2.  No surgical intervention at this time.  She has follow-up soon with Dr. Lovell Sheehan.  ROS: Per HPI  No Known Allergies Past Medical History:  Diagnosis Date  . Atrioventricular block, complete (HCC) 2006  . Hyperlipidemia   . Hypertension   . Paroxysmal atrial fibrillation (HCC) 2016   single episode detected on PPM interrogation    Current Outpatient Medications:  .  apixaban (ELIQUIS) 5 MG TABS tablet, Take 1 tablet (5 mg total) by mouth 2 (two) times daily., Disp: 60 tablet, Rfl: 5 .  atorvastatin (LIPITOR) 10 MG tablet, Take 1 tablet (10 mg total) by mouth every evening., Disp: 90 tablet, Rfl: 3 .   diltiazem (CARDIZEM CD) 120 MG 24 hr capsule, Take 1 capsule (120 mg total) by mouth daily., Disp: 90 capsule, Rfl: 3 .  hydrochlorothiazide (HYDRODIURIL) 12.5 MG tablet, Take 1 tablet (12.5 mg total) by mouth daily., Disp: 90 tablet, Rfl: 3 .  latanoprost (XALATAN) 0.005 % ophthalmic solution, Place 1 drop into both eyes at bedtime., Disp: , Rfl:  .  levothyroxine (SYNTHROID, LEVOTHROID) 75 MCG tablet, Take 0.5 tablets (37.5 mcg total) by mouth daily. Except 1 tab ( ) on Sunday., Disp: 90 tablet, Rfl: 3 .  Multiple Vitamins-Minerals (CENTRUM SILVER 50+WOMEN) TABS, Take 1 tablet by mouth daily., Disp: , Rfl:  .  Vitamin D, Ergocalciferol, (DRISDOL) 50000 units CAPS capsule, TAKE 1 CAPSULE BY MOUTH EACH WEEK, Disp: 12 capsule, Rfl: 0 Social History   Socioeconomic History  . Marital status: Widowed    Spouse name: Not on file  . Number of children: Not on file  . Years of education: Not on file  . Highest education level: Not on file  Occupational History  . Not on file  Social Needs  . Financial resource strain: Not on file  . Food insecurity:    Worry: Not on file    Inability: Not on file  . Transportation needs:    Medical: Not on file    Non-medical: Not on file  Tobacco Use  . Smoking status: Never Smoker  . Smokeless tobacco: Never Used  Substance and Sexual Activity  . Alcohol use: No    Alcohol/week: 0.0 standard drinks  . Drug use: No  .  Sexual activity: Not on file  Lifestyle  . Physical activity:    Days per week: Not on file    Minutes per session: Not on file  . Stress: Not on file  Relationships  . Social connections:    Talks on phone: Not on file    Gets together: Not on file    Attends religious service: Not on file    Active member of club or organization: Not on file    Attends meetings of clubs or organizations: Not on file    Relationship status: Not on file  . Intimate partner violence:    Fear of current or ex partner: Not on file     Emotionally abused: Not on file    Physically abused: Not on file    Forced sexual activity: Not on file  Other Topics Concern  . Not on file  Social History Narrative  . Not on file   Family History  Problem Relation Age of Onset  . Cancer Sister        breast    Objective: Office vital signs reviewed. BP 138/79   Pulse (!) 59   Temp (!) 97.3 F (36.3 C) (Oral)   Ht 5\' 1"  (1.549 m)   Wt 141 lb (64 kg)   BMI 26.64 kg/m   Physical Examination:  General: Awake, alert, well nourished, well appearing. No acute distress HEENT: Normal    Neck: No masses palpated. No lymphadenopathy; no goiter    Ears: Tympanic membranes intact, normal light reflex, no erythema, no bulging    Eyes: Sclera white.  PERRLA.  No exophthalmos. Cardio: Irregularly irregular.  Rate controlled., S1S2 heard, no murmurs appreciated Pulm: clear to auscultation bilaterally, no wheezes, rhonchi or rales; normal work of breathing on room air Skin: Sebaceous cyst on back is much more than previous exam.  Minimal surrounding erythema.  Continues to drain.  Assessment/ Plan: 84 y.o. female   1. HYPERTENSION, BENIGN Well-controlled on current regimen.  No changes.  2. Paroxysmal atrial fibrillation (HCC) Rate controlled.  Patient is asymptomatic.  3. Hypothyroidism due to acquired atrophy of thyroid Compliant with Synthroid.  No change in dose.  Plan for recheck of TSH at next visit with fasting labs.  Currently asymptomatic from a thyroid standpoint.  4.  Infected sebaceous cyst Clinically improving quite a bit.  Not totally resolved.  She has follow-up with general surgery in the next couple of weeks.  5. Need for influenza vaccination Influenza vaccine administered during today's visit.   Meds ordered this encounter  Medications  . apixaban (ELIQUIS) 5 MG TABS tablet    Sig: Take 1 tablet (5 mg total) by mouth 2 (two) times daily.    Dispense:  60 tablet    Refill:  5    DC xarelto when she  starts this- thank you     Raliegh Ip, DO [facility] [phone]

## 2018-01-19 DIAGNOSIS — H401132 Primary open-angle glaucoma, bilateral, moderate stage: Secondary | ICD-10-CM | POA: Diagnosis not present

## 2018-01-20 ENCOUNTER — Other Ambulatory Visit: Payer: Self-pay

## 2018-01-20 NOTE — Telephone Encounter (Signed)
Needs OV for recheck.

## 2018-01-20 NOTE — Telephone Encounter (Signed)
Last Vit D 05/16/16  37.5 

## 2018-01-22 ENCOUNTER — Encounter: Payer: Self-pay | Admitting: General Surgery

## 2018-01-22 ENCOUNTER — Ambulatory Visit: Payer: Medicare HMO | Admitting: General Surgery

## 2018-01-22 VITALS — BP 164/80 | HR 62 | Temp 97.5°F | Resp 16 | Wt 142.0 lb

## 2018-01-22 DIAGNOSIS — L723 Sebaceous cyst: Secondary | ICD-10-CM | POA: Diagnosis not present

## 2018-01-22 DIAGNOSIS — L089 Local infection of the skin and subcutaneous tissue, unspecified: Secondary | ICD-10-CM | POA: Diagnosis not present

## 2018-01-22 NOTE — Telephone Encounter (Signed)
[id number]-cb 10/24

## 2018-01-22 NOTE — Progress Notes (Signed)
Subjective:     [name] [name]  Here for wound check.  Patient states she has had minimal drainage from the sebaceous cyst on her back. Objective:    BP (!) 164/80 (BP Location: Left Arm, Patient Position: Sitting, Cuff Size: Normal)   Pulse 62   Temp (!) 97.5 F (36.4 C)   Resp 16   Wt 142 lb (64.4 kg)   BMI 26.83 kg/m   General:  alert, cooperative and no distress  Sebaceous cyst on her back without drainage.  Has healed nicely.     Assessment:    Infected sebaceous cyst, back, resolved    Plan:   No need for surgery.  Will follow-up expectantly.

## 2018-02-04 ENCOUNTER — Ambulatory Visit (INDEPENDENT_AMBULATORY_CARE_PROVIDER_SITE_OTHER): Payer: Medicare HMO | Admitting: *Deleted

## 2018-02-04 DIAGNOSIS — I442 Atrioventricular block, complete: Secondary | ICD-10-CM

## 2018-02-04 DIAGNOSIS — I480 Paroxysmal atrial fibrillation: Secondary | ICD-10-CM

## 2018-02-04 NOTE — Progress Notes (Signed)
Remote pacemaker transmission.   

## 2018-02-08 ENCOUNTER — Encounter: Payer: Self-pay | Admitting: Cardiology

## 2018-02-11 DIAGNOSIS — H353231 Exudative age-related macular degeneration, bilateral, with active choroidal neovascularization: Secondary | ICD-10-CM | POA: Diagnosis not present

## 2018-02-16 ENCOUNTER — Other Ambulatory Visit: Payer: Self-pay | Admitting: Family Medicine

## 2018-02-17 NOTE — Telephone Encounter (Signed)
Needs blood check prior to medication

## 2018-02-19 DIAGNOSIS — H401132 Primary open-angle glaucoma, bilateral, moderate stage: Secondary | ICD-10-CM | POA: Diagnosis not present

## 2018-04-08 DIAGNOSIS — H353231 Exudative age-related macular degeneration, bilateral, with active choroidal neovascularization: Secondary | ICD-10-CM | POA: Diagnosis not present

## 2018-04-11 LAB — CUP PACEART REMOTE DEVICE CHECK
Battery Remaining Percentage: 100 %
Brady Statistic RA Percent Paced: 0 %
Brady Statistic RV Percent Paced: 96 %
Implantable Lead Implant Date: 19980323
Implantable Lead Location: 753859
Implantable Lead Model: 5034
Lead Channel Impedance Value: 1318 Ohm
Lead Channel Impedance Value: 429 Ohm
Lead Channel Setting Pacing Amplitude: 2.5 V
Lead Channel Setting Pacing Pulse Width: 0.4 ms
Lead Channel Setting Sensing Sensitivity: 4 mV
MDC IDC LEAD IMPLANT DT: 19980323
MDC IDC LEAD LOCATION: 753860
MDC IDC MSMT BATTERY REMAINING LONGEVITY: 180 mo
MDC IDC MSMT LEADCHNL RV PACING THRESHOLD AMPLITUDE: 1.2 V
MDC IDC MSMT LEADCHNL RV PACING THRESHOLD PULSEWIDTH: 0.4 ms
MDC IDC PG IMPLANT DT: 20181017
MDC IDC SESS DTM: 20191106064100
Pulse Gen Serial Number: 807281

## 2018-05-01 ENCOUNTER — Encounter: Payer: Self-pay | Admitting: Internal Medicine

## 2018-05-01 ENCOUNTER — Ambulatory Visit (INDEPENDENT_AMBULATORY_CARE_PROVIDER_SITE_OTHER): Payer: Medicare HMO | Admitting: Internal Medicine

## 2018-05-01 ENCOUNTER — Encounter: Payer: Medicare HMO | Admitting: Internal Medicine

## 2018-05-01 VITALS — BP 138/62 | HR 80 | Ht 61.0 in | Wt 141.0 lb

## 2018-05-01 DIAGNOSIS — Z950 Presence of cardiac pacemaker: Secondary | ICD-10-CM

## 2018-05-01 DIAGNOSIS — I4819 Other persistent atrial fibrillation: Secondary | ICD-10-CM

## 2018-05-01 DIAGNOSIS — I10 Essential (primary) hypertension: Secondary | ICD-10-CM | POA: Diagnosis not present

## 2018-05-01 DIAGNOSIS — I442 Atrioventricular block, complete: Secondary | ICD-10-CM

## 2018-05-01 NOTE — Patient Instructions (Signed)
Medication Instructions:  Continue all current medications.  Labwork: none  Testing/Procedures: none  Follow-Up: 1 year   Any Other Special Instructions Will Be Listed Below (If Applicable). Next remote 05/06/2018  If you need a refill on your cardiac medications before your next appointment, please call your pharmacy.

## 2018-05-01 NOTE — Progress Notes (Signed)
    PCP: Raliegh Ip, DO Primary Cardiologist: Dr Antoine Poche Primary EP:  Dr Zenovia Jordan [name] [name] is a 85 y.o. female who presents today for routine electrophysiology followup.  Since last being seen in our clinic, the patient reports doing very well.  Today, she denies symptoms of palpitations, chest pain, shortness of breath,  lower extremity edema, dizziness, presyncope, or syncope.  The patient is otherwise without complaint today.   Past Medical History:  Diagnosis Date  . Atrioventricular block, complete (HCC) 2006  . Hyperlipidemia   . Hypertension   . Paroxysmal atrial fibrillation (HCC) 2016   single episode detected on PPM interrogation   Past Surgical History:  Procedure Laterality Date  . ABDOMINAL HYSTERECTOMY    . EYE SURGERY Bilateral    cataracts  . GALLBLADDER SURGERY    . Normal coronary angiography  1999  . PPM GENERATOR CHANGEOUT N/A 01/15/2017   [employer] Accolade MRI EL DL pacemaker for CHB by Dr Johney Frame  . PULSE GENERATOR IMPLANT     Guidant Ultra dual-mode, dual-pacing, dual-sensing generator for complete atrioventricular block 2006 with some noise on atrial lead and generator recall for very low incidence of loss of output    ROS- all systems are reviewed and negative except as per HPI above  Current Outpatient Medications  Medication Sig Dispense Refill  . apixaban (ELIQUIS) 5 MG TABS tablet Take 1 tablet (5 mg total) by mouth 2 (two) times daily. 60 tablet 5  . atorvastatin (LIPITOR) 10 MG tablet Take 1 tablet (10 mg total) by mouth every evening. 90 tablet 3  . diltiazem (CARDIZEM CD) 120 MG 24 hr capsule Take 1 capsule (120 mg total) by mouth daily. 90 capsule 3  . hydrochlorothiazide (HYDRODIURIL) 12.5 MG tablet Take 1 tablet (12.5 mg total) by mouth daily. 90 tablet 3  . latanoprost (XALATAN) 0.005 % ophthalmic solution Place 1 drop into both eyes at bedtime.    Marland Kitchen levothyroxine (SYNTHROID, LEVOTHROID) 75 MCG tablet Take 0.5 tablets  (37.5 mcg total) by mouth daily. Except 1 tab ( ) on Sunday. 90 tablet 3  . Multiple Vitamins-Minerals (CENTRUM SILVER 50+WOMEN) TABS Take 1 tablet by mouth daily.    . Vitamin D, Ergocalciferol, (DRISDOL) 50000 units CAPS capsule TAKE 1 CAPSULE BY MOUTH EACH WEEK 12 capsule 0   No current facility-administered medications for this visit.     Physical Exam: Vitals:   05/01/18 1312  BP: 138/62  Pulse: 80  SpO2: 96%  Weight: 141 lb (64 kg)  Height: 5\' 1"  (1.549 m)    GEN- The patient is well appearing, alert and oriented x 3 today.   Head- normocephalic, atraumatic Eyes-  Sclera clear, conjunctiva pink Ears- hearing intact Oropharynx- clear Lungs- Clear to ausculation bilaterally, normal work of breathing Chest- pacemaker pocket is well healed Heart- Regular rate and rhythm, no murmurs, rubs or gallops, PMI not laterally displaced GI- soft, NT, ND, + BS Extremities- no clubbing, cyanosis, or edema  Pacemaker interrogation- reviewed in detail today,  See PACEART report    Assessment and Plan:  1. Symptomatic complete heart block Normal pacemaker function See Pace Art report No changes today  2. Persistent afib On eliquis chads2vasc score is 4  3. HTN Stable No change required today  4. PVCs Frequent PVCs, bigeminy at times Asymptomatic No changes  latitude  Return in a year  Hillis Range MD, [facility] 05/01/2018 1:57 PM

## 2018-05-06 ENCOUNTER — Ambulatory Visit (INDEPENDENT_AMBULATORY_CARE_PROVIDER_SITE_OTHER): Payer: Medicare HMO

## 2018-05-06 DIAGNOSIS — I495 Sick sinus syndrome: Secondary | ICD-10-CM

## 2018-05-06 DIAGNOSIS — I442 Atrioventricular block, complete: Secondary | ICD-10-CM

## 2018-05-06 DIAGNOSIS — H353231 Exudative age-related macular degeneration, bilateral, with active choroidal neovascularization: Secondary | ICD-10-CM | POA: Diagnosis not present

## 2018-05-08 LAB — CUP PACEART REMOTE DEVICE CHECK
Battery Remaining Longevity: 162 mo
Brady Statistic RA Percent Paced: 0 %
Brady Statistic RV Percent Paced: 74 %
Implantable Lead Implant Date: 19980323
Implantable Lead Location: 753860
Implantable Lead Model: 4524
Implantable Lead Model: 5034
Implantable Pulse Generator Implant Date: 20181017
Lead Channel Impedance Value: 1304 Ohm
Lead Channel Impedance Value: 402 Ohm
Lead Channel Pacing Threshold Amplitude: 1.1 V
Lead Channel Setting Pacing Amplitude: 2.5 V
Lead Channel Setting Pacing Pulse Width: 0.4 ms
MDC IDC LEAD IMPLANT DT: 19980323
MDC IDC LEAD LOCATION: 753859
MDC IDC MSMT BATTERY REMAINING PERCENTAGE: 100 %
MDC IDC MSMT LEADCHNL RV PACING THRESHOLD PULSEWIDTH: 0.4 ms
MDC IDC SESS DTM: 20200205064100
MDC IDC SET LEADCHNL RV SENSING SENSITIVITY: 4 mV
Pulse Gen Serial Number: 807281

## 2018-05-15 NOTE — Progress Notes (Addendum)
Remote pacemaker transmission.   

## 2018-06-16 ENCOUNTER — Ambulatory Visit: Payer: Medicare HMO | Admitting: Family Medicine

## 2018-06-17 LAB — CUP PACEART INCLINIC DEVICE CHECK
Brady Statistic RV Percent Paced: 91 %
Date Time Interrogation Session: 20200131050000
Implantable Lead Implant Date: 19980323
Implantable Lead Implant Date: 19980323
Implantable Lead Location: 753859
Implantable Lead Location: 753860
Implantable Lead Model: 4524
Lead Channel Impedance Value: 1332 Ohm
Lead Channel Impedance Value: 418 Ohm
Lead Channel Pacing Threshold Amplitude: 1 V
Lead Channel Pacing Threshold Pulse Width: 0.4 ms
Lead Channel Setting Pacing Pulse Width: 0.4 ms
Lead Channel Setting Sensing Sensitivity: 4 mV
MDC IDC PG IMPLANT DT: 20181017
MDC IDC SET LEADCHNL RV PACING AMPLITUDE: 2.5 V
Pulse Gen Serial Number: 807281

## 2018-06-18 ENCOUNTER — Other Ambulatory Visit: Payer: Self-pay | Admitting: *Deleted

## 2018-06-18 MED ORDER — ATORVASTATIN CALCIUM 10 MG PO TABS: 10.0000 mg | ORAL_TABLET | Freq: Every evening | ORAL | 0 refills | Status: DC

## 2018-07-22 ENCOUNTER — Ambulatory Visit: Payer: Medicare HMO | Admitting: Family Medicine

## 2018-07-22 ENCOUNTER — Ambulatory Visit (INDEPENDENT_AMBULATORY_CARE_PROVIDER_SITE_OTHER): Payer: Medicare HMO | Admitting: Family Medicine

## 2018-07-22 ENCOUNTER — Other Ambulatory Visit: Payer: Self-pay

## 2018-07-22 DIAGNOSIS — E782 Mixed hyperlipidemia: Secondary | ICD-10-CM | POA: Diagnosis not present

## 2018-07-22 DIAGNOSIS — E034 Atrophy of thyroid (acquired): Secondary | ICD-10-CM | POA: Diagnosis not present

## 2018-07-22 DIAGNOSIS — I480 Paroxysmal atrial fibrillation: Secondary | ICD-10-CM | POA: Diagnosis not present

## 2018-07-22 DIAGNOSIS — I10 Essential (primary) hypertension: Secondary | ICD-10-CM

## 2018-07-22 MED ORDER — HYDROCHLOROTHIAZIDE 12.5 MG PO TABS: 12.5000 mg | ORAL_TABLET | Freq: Every day | ORAL | 3 refills | Status: DC

## 2018-07-22 MED ORDER — LEVOTHYROXINE SODIUM 75 MCG PO TABS: 37.5000 ug | ORAL_TABLET | Freq: Every day | ORAL | 3 refills | Status: DC

## 2018-07-22 MED ORDER — DILTIAZEM HCL ER COATED BEADS 120 MG PO CP24: 120.0000 mg | ORAL_CAPSULE | Freq: Every day | ORAL | 3 refills | Status: DC

## 2018-07-22 MED ORDER — ATORVASTATIN CALCIUM 10 MG PO TABS: 10.0000 mg | ORAL_TABLET | Freq: Every evening | ORAL | 3 refills | Status: DC

## 2018-07-22 MED ORDER — APIXABAN 5 MG PO TABS: 5.0000 mg | ORAL_TABLET | Freq: Two times a day (BID) | ORAL | 5 refills | Status: DC

## 2018-07-22 NOTE — Progress Notes (Signed)
Telephone visit  Subjective: CC: Follow-up hypertension, atrial fibrillation, hyperlipidemia and hypothyroidism PCP: Raliegh Ip, DO GPQ:DIYMEBR [name] [name] is a 85 y.o. female calls for telephone consult today. Patient provides verbal consent for consult held via phone.  Location of patient: Home Location of provider: [facility] Others present for call: None  1.  Hypertension/atrial fibrillation/hyperlipidemia Patient reports compliance with Lipitor, Cardizem, hydrochlorothiazide and Eliquis.  She denies any chest pain, shortness of breath, dizziness, lower extremity edema, falls, melena or hematochezia.  No abnormal vaginal bleeding or hematuria.  She is feeling well.  2.  Hypothyroidism Patient reports compliance with Synthroid, 1/2 tablet daily.  She denies any change in voice, difficulty swallowing, tremors, heart palpitations, diarrhea, constipation or change in weight.   ROS: Per HPI  No Known Allergies Past Medical History:  Diagnosis Date  . Atrioventricular block, complete (HCC) 2006  . Hyperlipidemia   . Hypertension   . Paroxysmal atrial fibrillation (HCC) 2016   single episode detected on PPM interrogation    Current Outpatient Medications:  .  apixaban (ELIQUIS) 5 MG TABS tablet, Take 1 tablet (5 mg total) by mouth 2 (two) times daily., Disp: 60 tablet, Rfl: 5 .  atorvastatin (LIPITOR) 10 MG tablet, Take 1 tablet (10 mg total) by mouth every evening., Disp: 90 tablet, Rfl: 0 .  diltiazem (CARDIZEM CD) 120 MG 24 hr capsule, Take 1 capsule (120 mg total) by mouth daily., Disp: 90 capsule, Rfl: 3 .  hydrochlorothiazide (HYDRODIURIL) 12.5 MG tablet, Take 1 tablet (12.5 mg total) by mouth daily., Disp: 90 tablet, Rfl: 3 .  latanoprost (XALATAN) 0.005 % ophthalmic solution, Place 1 drop into both eyes at bedtime., Disp: , Rfl:  .  levothyroxine (SYNTHROID, LEVOTHROID) 75 MCG tablet, Take 0.5 tablets (37.5 mcg total) by mouth daily. Except 1 tab ( ) on Sunday., Disp: 90  tablet, Rfl: 3 .  Multiple Vitamins-Minerals (CENTRUM SILVER 50+WOMEN) TABS, Take 1 tablet by mouth daily., Disp: , Rfl:  .  Vitamin D, Ergocalciferol, (DRISDOL) 50000 units CAPS capsule, TAKE 1 CAPSULE BY MOUTH EACH WEEK, Disp: 12 capsule, Rfl: 0  Assessment/ Plan: 85 y.o. female   1. HYPERTENSION, BENIGN Controlled.  Refills of hydrochlorothiazide and diltiazem sent - diltiazem (CARDIZEM CD) 120 MG 24 hr capsule; Take 1 capsule (120 mg total) by mouth daily.  Dispense: 90 capsule; Refill: 3 - hydrochlorothiazide (HYDRODIURIL) 12.5 MG tablet; Take 1 tablet (12.5 mg total) by mouth daily.  Dispense: 90 tablet; Refill: 3  2. Paroxysmal atrial fibrillation (HCC) Rate controlled.  No adverse side effects.  Refills sent - diltiazem (CARDIZEM CD) 120 MG 24 hr capsule; Take 1 capsule (120 mg total) by mouth daily.  Dispense: 90 capsule; Refill: 3 - apixaban (ELIQUIS) 5 MG TABS tablet; Take 1 tablet (5 mg total) by mouth 2 (two) times daily.  Dispense: 60 tablet; Refill: 5  3. Hypothyroidism due to acquired atrophy of thyroid No symptoms.  Refill sent.  Plan for TSH in the next couple months - levothyroxine (SYNTHROID) 75 MCG tablet; Take 0.5 tablets (37.5 mcg total) by mouth daily. Except 1 tab ( ) on Sunday.  Dispense: 90 tablet; Refill: 3  4. Mixed hyperlipidemia Plan for fasting lipid panel in the next couple of months.  Refill sent - atorvastatin (LIPITOR) 10 MG tablet; Take 1 tablet (10 mg total) by mouth every evening.  Dispense: 90 tablet; Refill: 3   Start time: 1:58pm End time: 2:04pm  Total time spent on patient care (including telephone call/ virtual visit): 15 minutes  [name] Norlander, DO [facility] [phone]

## 2018-07-24 ENCOUNTER — Ambulatory Visit: Payer: Medicare HMO | Admitting: Family Medicine

## 2018-07-26 ENCOUNTER — Encounter: Payer: Self-pay | Admitting: General Surgery

## 2018-08-12 ENCOUNTER — Other Ambulatory Visit: Payer: Self-pay

## 2018-08-12 ENCOUNTER — Ambulatory Visit (INDEPENDENT_AMBULATORY_CARE_PROVIDER_SITE_OTHER): Payer: Medicare HMO | Admitting: *Deleted

## 2018-08-12 DIAGNOSIS — I442 Atrioventricular block, complete: Secondary | ICD-10-CM

## 2018-08-12 DIAGNOSIS — I495 Sick sinus syndrome: Secondary | ICD-10-CM

## 2018-08-13 LAB — CUP PACEART REMOTE DEVICE CHECK
Battery Remaining Longevity: 168 mo
Battery Remaining Percentage: 100 %
Brady Statistic RA Percent Paced: 0 %
Brady Statistic RV Percent Paced: 80 %
Date Time Interrogation Session: 20200513054000
Implantable Lead Implant Date: 19980323
Implantable Lead Implant Date: 19980323
Implantable Lead Location: 753859
Implantable Lead Location: 753860
Implantable Lead Model: 4524
Implantable Lead Model: 5034
Implantable Pulse Generator Implant Date: 20181017
Lead Channel Impedance Value: 1254 Ohm
Lead Channel Impedance Value: 425 Ohm
Lead Channel Pacing Threshold Amplitude: 1.1 V
Lead Channel Pacing Threshold Pulse Width: 0.4 ms
Lead Channel Setting Pacing Amplitude: 2.5 V
Lead Channel Setting Pacing Pulse Width: 0.4 ms
Lead Channel Setting Sensing Sensitivity: 4 mV
Pulse Gen Serial Number: 807281

## 2018-08-28 NOTE — Progress Notes (Signed)
Remote pacemaker transmission.   

## 2018-11-11 ENCOUNTER — Ambulatory Visit (INDEPENDENT_AMBULATORY_CARE_PROVIDER_SITE_OTHER): Payer: Medicare HMO | Admitting: *Deleted

## 2018-11-11 DIAGNOSIS — I495 Sick sinus syndrome: Secondary | ICD-10-CM

## 2018-11-11 LAB — CUP PACEART REMOTE DEVICE CHECK
Battery Remaining Longevity: 168 mo
Battery Remaining Percentage: 100 %
Brady Statistic RA Percent Paced: 0 %
Brady Statistic RV Percent Paced: 88 %
Date Time Interrogation Session: 20200812054100
Implantable Lead Implant Date: 19980323
Implantable Lead Implant Date: 19980323
Implantable Lead Location: 753859
Implantable Lead Location: 753860
Implantable Lead Model: 4524
Implantable Lead Model: 5034
Implantable Pulse Generator Implant Date: 20181017
Lead Channel Impedance Value: 1281 Ohm
Lead Channel Impedance Value: 396 Ohm
Lead Channel Pacing Threshold Amplitude: 1 V
Lead Channel Pacing Threshold Pulse Width: 0.4 ms
Lead Channel Setting Pacing Amplitude: 2.5 V
Lead Channel Setting Pacing Pulse Width: 0.4 ms
Lead Channel Setting Sensing Sensitivity: 4 mV
Pulse Gen Serial Number: 807281

## 2018-11-19 ENCOUNTER — Encounter: Payer: Self-pay | Admitting: Cardiology

## 2018-11-19 NOTE — Progress Notes (Signed)
Remote pacemaker transmission.   

## 2018-12-03 DIAGNOSIS — M79671 Pain in right foot: Secondary | ICD-10-CM | POA: Diagnosis not present

## 2018-12-03 DIAGNOSIS — S9031XA Contusion of right foot, initial encounter: Secondary | ICD-10-CM | POA: Diagnosis not present

## 2018-12-31 DIAGNOSIS — S9031XD Contusion of right foot, subsequent encounter: Secondary | ICD-10-CM | POA: Diagnosis not present

## 2018-12-31 DIAGNOSIS — L03125 Acute lymphangitis of right lower limb: Secondary | ICD-10-CM | POA: Diagnosis not present

## 2018-12-31 DIAGNOSIS — M79671 Pain in right foot: Secondary | ICD-10-CM | POA: Diagnosis not present

## 2019-01-14 DIAGNOSIS — S9031XD Contusion of right foot, subsequent encounter: Secondary | ICD-10-CM | POA: Diagnosis not present

## 2019-02-10 ENCOUNTER — Ambulatory Visit (INDEPENDENT_AMBULATORY_CARE_PROVIDER_SITE_OTHER): Payer: Medicare HMO | Admitting: *Deleted

## 2019-02-10 DIAGNOSIS — I442 Atrioventricular block, complete: Secondary | ICD-10-CM

## 2019-02-10 DIAGNOSIS — I4891 Unspecified atrial fibrillation: Secondary | ICD-10-CM

## 2019-02-10 LAB — CUP PACEART REMOTE DEVICE CHECK
Battery Remaining Longevity: 162 mo
Battery Remaining Percentage: 100 %
Brady Statistic RA Percent Paced: 0 %
Brady Statistic RV Percent Paced: 91 %
Date Time Interrogation Session: 20201111044039
Implantable Lead Implant Date: 19980323
Implantable Lead Implant Date: 19980323
Implantable Lead Location: 753859
Implantable Lead Location: 753860
Implantable Lead Model: 4524
Implantable Lead Model: 5034
Implantable Pulse Generator Implant Date: 20181017
Lead Channel Impedance Value: 1253 Ohm
Lead Channel Impedance Value: 413 Ohm
Lead Channel Pacing Threshold Amplitude: 1.1 V
Lead Channel Pacing Threshold Pulse Width: 0.4 ms
Lead Channel Setting Pacing Amplitude: 2.5 V
Lead Channel Setting Pacing Pulse Width: 0.4 ms
Lead Channel Setting Sensing Sensitivity: 4 mV
Pulse Gen Serial Number: 807281

## 2019-03-02 ENCOUNTER — Other Ambulatory Visit: Payer: Self-pay | Admitting: Family Medicine

## 2019-03-02 DIAGNOSIS — I480 Paroxysmal atrial fibrillation: Secondary | ICD-10-CM

## 2019-03-03 NOTE — Progress Notes (Signed)
Remote pacemaker transmission.   

## 2019-03-29 ENCOUNTER — Other Ambulatory Visit: Payer: Self-pay | Admitting: Family Medicine

## 2019-03-29 DIAGNOSIS — I480 Paroxysmal atrial fibrillation: Secondary | ICD-10-CM

## 2019-04-09 ENCOUNTER — Telehealth: Payer: Self-pay | Admitting: Family Medicine

## 2019-04-09 NOTE — Telephone Encounter (Signed)
Advised ok for pt to get vaccine.

## 2019-04-19 ENCOUNTER — Other Ambulatory Visit: Payer: Self-pay | Admitting: Family Medicine

## 2019-04-19 DIAGNOSIS — I480 Paroxysmal atrial fibrillation: Secondary | ICD-10-CM

## 2019-04-19 DIAGNOSIS — I10 Essential (primary) hypertension: Secondary | ICD-10-CM

## 2019-04-22 ENCOUNTER — Other Ambulatory Visit: Payer: Self-pay | Admitting: Family Medicine

## 2019-04-22 DIAGNOSIS — I480 Paroxysmal atrial fibrillation: Secondary | ICD-10-CM

## 2019-04-22 NOTE — Telephone Encounter (Signed)
Patient aware.

## 2019-04-22 NOTE — Telephone Encounter (Signed)
ntbs 30 day supply was given 03/29/19

## 2019-05-04 ENCOUNTER — Telehealth: Payer: Self-pay | Admitting: Internal Medicine

## 2019-05-04 NOTE — Telephone Encounter (Signed)
Virtual Visit Pre-Appointment Phone Call  "(Name), I am calling you today to discuss your upcoming appointment. We are currently trying to limit exposure to the virus that causes COVID-19 by seeing patients at home rather than in the office."  1. "What is the BEST phone number to call the day of the visit?" - [phone]   2. "Do you have or have access to (through a family member/friend) a smartphone with video capability that we can use for your visit?" a. If yes - list this number in appt notes as "cell" (if different from BEST phone #) and list the appointment type as a VIDEO visit in appointment notes b. If no - list the appointment type as a PHONE visit in appointment notes  3. Confirm consent - "In the setting of the current Covid19 crisis, you are scheduled for a (phone or video) visit with your provider on (date) at (time).  Just as we do with many in-office visits, in order for you to participate in this visit, we must obtain consent.  If you'd like, I can send this to your mychart (if signed up) or email for you to review.  Otherwise, I can obtain your verbal consent now.  All virtual visits are billed to your insurance company just like a normal visit would be.  By agreeing to a virtual visit, we'd like you to understand that the technology does not allow for your provider to perform an examination, and thus may limit your provider's ability to fully assess your condition. If your provider identifies any concerns that need to be evaluated in person, we will make arrangements to do so.  Finally, though the technology is pretty good, we cannot assure that it will always work on either your or our end, and in the setting of a video visit, we may have to convert it to a phone-only visit.  In either situation, we cannot ensure that we have a secure connection.  Are you willing to proceed?" STAFF: Did the patient verbally acknowledge consent to telehealth visit? Document YES/NO here:  YES  4. Advise patient to be prepared - "Two hours prior to your appointment, go ahead and check your blood pressure, pulse, oxygen saturation, and your weight (if you have the equipment to check those) and write them all down. When your visit starts, your provider will ask you for this information. If you have an Apple Watch or Kardia device, please plan to have heart rate information ready on the day of your appointment. Please have a pen and paper handy nearby the day of the visit as well."  5. Give patient instructions for MyChart download to smartphone OR Doximity/[email or web address] as below if video visit (depending on what platform provider is using)  6. Inform patient they will receive a phone call 15 minutes prior to their appointment time (may be from unknown caller ID) so they should be prepared to answer    TELEPHONE CALL NOTE  [name] [name] has been deemed a candidate for a follow-up tele-health visit to limit community exposure during the Covid-19 pandemic. I spoke with the patient via phone to ensure availability of phone/video source, confirm preferred email & phone number, and discuss instructions and expectations.  I reminded [name] [name] to be prepared with any vital sign and/or heart rhythm information that could potentially be obtained via home monitoring, at the time of her visit. I reminded [name] [name] to expect a phone call prior to her visit.  Megan Salon 05/04/2019 3:43 PM   INSTRUCTIONS FOR DOWNLOADING THE MYCHART APP TO SMARTPHONE  - The patient must first make sure to have activated MyChart and know their login information - If Apple, go to [employer] and type in MyChart in the search bar and download the app. If Android, ask patient to go to [employer] and type in [place] in the search bar and download the app. The app is free but as with any other app downloads, their phone may require them to verify saved payment information or Apple/Android password.  -  The patient will need to then log into the app with their MyChart username and password, and select [facility] as their healthcare provider to link the account. When it is time for your visit, go to the MyChart app, find appointments, and click Begin Video Visit. Be sure to Select Allow for your device to access the Microphone and Camera for your visit. You will then be connected, and your provider will be with you shortly.  **If they have any issues connecting, or need assistance please contact MyChart service desk [phone])**  **If using a computer, in order to ensure the best quality for their visit they will need to use either of the following Internet Browsers: [employer] or Google Chrome**  IF USING DOXIMITY or DOXY.ME - The patient will receive a link just prior to their visit by text.     FULL LENGTH CONSENT FOR TELE-HEALTH VISIT   I hereby voluntarily request, consent and authorize [facility] and its employed or contracted physicians, physician assistants, nurse practitioners or other licensed health care professionals (the Practitioner), to provide me with telemedicine health care services (the "Services") as deemed necessary by the treating Practitioner. I acknowledge and consent to receive the Services by the Practitioner via telemedicine. I understand that the telemedicine visit will involve communicating with the Practitioner through live audiovisual communication technology and the disclosure of certain medical information by electronic transmission. I acknowledge that I have been given the opportunity to request an in-person assessment or other available alternative prior to the telemedicine visit and am voluntarily participating in the telemedicine visit.  I understand that I have the right to withhold or withdraw my consent to the use of telemedicine in the course of my care at any time, without affecting my right to future care or treatment, and that the  Practitioner or I may terminate the telemedicine visit at any time. I understand that I have the right to inspect all information obtained and/or recorded in the course of the telemedicine visit and may receive copies of available information for a reasonable fee.  I understand that some of the potential risks of receiving the Services via telemedicine include:  Marland Kitchen Delay or interruption in medical evaluation due to technological equipment failure or disruption; . Information transmitted may not be sufficient (e.g. poor resolution of images) to allow for appropriate medical decision making by the Practitioner; and/or  . In rare instances, security protocols could fail, causing a breach of personal health information.  Furthermore, I acknowledge that it is my responsibility to provide information about my medical history, conditions and care that is complete and accurate to the best of my ability. I acknowledge that Practitioner's advice, recommendations, and/or decision may be based on factors not within their control, such as incomplete or inaccurate data provided by me or distortions of diagnostic images or specimens that may result from electronic transmissions. I understand that the  practice of medicine is not an Chief Strategy Officer and that Practitioner makes no warranties or guarantees regarding treatment outcomes. I acknowledge that I will receive a copy of this consent concurrently upon execution via email to the email address I last provided but may also request a printed copy by calling the office of [facility].    I understand that my insurance will be billed for this visit.   I have read or had this consent read to me. . I understand the contents of this consent, which adequately explains the benefits and risks of the Services being provided via telemedicine.  . I have been provided ample opportunity to ask questions regarding this consent and the Services and have had my questions answered to my  satisfaction. . I give my informed consent for the services to be provided through the use of telemedicine in my medical care  By participating in this telemedicine visit I agree to the above.

## 2019-05-05 ENCOUNTER — Ambulatory Visit (INDEPENDENT_AMBULATORY_CARE_PROVIDER_SITE_OTHER): Payer: Medicare HMO | Admitting: Family Medicine

## 2019-05-05 DIAGNOSIS — I10 Essential (primary) hypertension: Secondary | ICD-10-CM | POA: Diagnosis not present

## 2019-05-05 DIAGNOSIS — I480 Paroxysmal atrial fibrillation: Secondary | ICD-10-CM | POA: Diagnosis not present

## 2019-05-05 DIAGNOSIS — E559 Vitamin D deficiency, unspecified: Secondary | ICD-10-CM

## 2019-05-05 DIAGNOSIS — E782 Mixed hyperlipidemia: Secondary | ICD-10-CM

## 2019-05-05 DIAGNOSIS — E034 Atrophy of thyroid (acquired): Secondary | ICD-10-CM | POA: Diagnosis not present

## 2019-05-05 MED ORDER — APIXABAN 5 MG PO TABS: 5.0000 mg | ORAL_TABLET | Freq: Two times a day (BID) | ORAL | 3 refills | Status: DC

## 2019-05-05 NOTE — Progress Notes (Signed)
Telephone visit  Subjective: CC: afib, hypothyroidism PCP: Janora Norlander, DO HUT:[id number] L Maglione is a 86 y.o. female calls for telephone consult today. Patient provides verbal consent for consult held via phone.  Due to COVID-19 pandemic this visit was conducted virtually. This visit type was conducted due to national recommendations for restrictions regarding the COVID-19 Pandemic (e.g. social distancing, sheltering in place) in an effort to limit this patient's exposure and mitigate transmission in our community. All issues noted in this document were discussed and addressed.  A physical exam was not performed with this format.   Location of patient: home Location of provider: Working remotely from home Others present for call: none  1.  Atrial fibrillation Patient reports compliance with her medications but notes she took her last Eliquis tablet yesterday.  She is not had any problems with heart palpitations, tachycardia, shortness of breath, change in exercise tolerance or abnormal bleeding.  2.  Hypertension with hyperlipidemia Patient is compliant with Lipitor, hydrochlorothiazide and diltiazem as above.  No chest pain, shortness of breath, edema  3.  Hypothyroidism Patient reports compliance with 1/2 tablet of Synthroid daily except for Sundays she takes a full tablet.  She denies any heart palpitations, change in weight or bowel habits.   ROS: Per HPI  No Known Allergies Past Medical History:  Diagnosis Date  . Atrioventricular block, complete ([facility]) 2006  . Hyperlipidemia   . Hypertension   . Paroxysmal atrial fibrillation ([facility]) 2016   single episode detected on PPM interrogation    Current Outpatient Medications:  .  atorvastatin (LIPITOR) 10 MG tablet, Take 1 tablet (10 mg total) by mouth every evening., Disp: 90 tablet, Rfl: 3 .  diltiazem (CARDIZEM CD) 120 MG 24 hr capsule, TAKE 1 CAPSULE BY MOUTH EVERY DAY, Disp: 90 capsule, Rfl: 3 .  ELIQUIS 5 MG TABS tablet,  TAKE 1 TABLET (5 MG TOTAL) BY MOUTH 2 (TWO) TIMES DAILY. (NEEDS TO BE SEEN BEFORE NEXT REFILL), Disp: 60 tablet, Rfl: 0 .  hydrochlorothiazide (HYDRODIURIL) 12.5 MG tablet, TAKE 1 TABLET BY MOUTH EVERY DAY, Disp: 90 tablet, Rfl: 3 .  latanoprost (XALATAN) 0.005 % ophthalmic solution, Place 1 drop into both eyes at bedtime., Disp: , Rfl:  .  levothyroxine (SYNTHROID) 75 MCG tablet, Take 0.5 tablets (37.5 mcg total) by mouth daily. Except 1 tab (65mg) on Sunday., Disp: 90 tablet, Rfl: 3 .  Multiple Vitamins-Minerals (CENTRUM SILVER 50+WOMEN) TABS, Take 1 tablet by mouth daily., Disp: , Rfl:  .  Vitamin D, Ergocalciferol, (DRISDOL) 50000 units CAPS capsule, TAKE 1 CAPSULE BY MOUTH EACH WEEK, Disp: 12 capsule, Rfl: 0  Assessment/ Plan: 86y.o. female   1. Paroxysmal atrial fibrillation (HCC) Asymptomatic.  No bleeding episodes.  Eliquis renewed.  She will come in for fasting labs - apixaban (ELIQUIS) 5 MG TABS tablet; Take 1 tablet (5 mg total) by mouth 2 (two) times daily.  Dispense: 180 tablet; Refill: 3 - CBC; Future  2. Hypothyroidism due to acquired atrophy of thyroid Asymptomatic.  Continue Synthroid.  Check thyroid panel - Thyroid Panel With TSH; Future  3. Mixed hyperlipidemia Continue Lipitor 10 mg daily.  Check fasting lipid panel - CMP14+EGFR; Future - Lipid panel; Future  4. HYPERTENSION, BENIGN Has not been checking blood pressures.  We can have her see the nurse when she comes in for labs for a spot blood pressure check. - CMP14+EGFR; Future  5. Vitamin D deficiency - VITAMIN D 25 Hydroxy (Vit-D Deficiency, Fractures); Future   Start  time: 3:00pm End time: 3:11pm  Total time spent on patient care (including telephone call/ virtual visit): 20 minutes  [place], [place] [phone]

## 2019-05-07 ENCOUNTER — Encounter: Payer: Self-pay | Admitting: Internal Medicine

## 2019-05-07 ENCOUNTER — Telehealth (INDEPENDENT_AMBULATORY_CARE_PROVIDER_SITE_OTHER): Payer: Medicare HMO | Admitting: Internal Medicine

## 2019-05-07 VITALS — Ht 61.0 in | Wt 141.0 lb

## 2019-05-07 DIAGNOSIS — I4819 Other persistent atrial fibrillation: Secondary | ICD-10-CM | POA: Diagnosis not present

## 2019-05-07 DIAGNOSIS — I10 Essential (primary) hypertension: Secondary | ICD-10-CM | POA: Diagnosis not present

## 2019-05-07 DIAGNOSIS — I442 Atrioventricular block, complete: Secondary | ICD-10-CM | POA: Diagnosis not present

## 2019-05-07 DIAGNOSIS — D6869 Other thrombophilia: Secondary | ICD-10-CM | POA: Diagnosis not present

## 2019-05-07 NOTE — Progress Notes (Signed)
Electrophysiology TeleHealth Note  Due to national recommendations of social distancing due to COVID 19, an audio telehealth visit is felt to be most appropriate for this patient at this time.  Verbal consent was obtained by me for the telehealth visit today.  The patient does not have capability for a virtual visit.  A phone visit is therefore required today.   Date:  05/07/2019   ID:  [name] [name], DOB February 14, 1925, MRN [id number]  Location: patient's home  Provider location:  [place]  Evaluation Performed: Follow-up visit  PCP:  Raliegh Ip, DO   Electrophysiologist:  Dr Johney Frame  Chief Complaint:  Pacemaker follow up  History of Present Illness:    [name] [name] is a 86 y.o. female who presents via telehealth conferencing today.  Since last being seen in our clinic, the patient reports doing very well.  Today, she denies symptoms of palpitations, chest pain, shortness of breath,  lower extremity edema, dizziness, presyncope, or syncope.  The patient is otherwise without complaint today.  The patient denies symptoms of fevers, chills, cough, or new SOB worrisome for COVID 19.  Past Medical History:  Diagnosis Date  . Atrioventricular block, complete (HCC) 2006  . Hyperlipidemia   . Hypertension   . Paroxysmal atrial fibrillation (HCC) 2016   single episode detected on PPM interrogation    Past Surgical History:  Procedure Laterality Date  . ABDOMINAL HYSTERECTOMY    . EYE SURGERY Bilateral    cataracts  . GALLBLADDER SURGERY    . Normal coronary angiography  1999  . PPM GENERATOR CHANGEOUT N/A 01/15/2017   [employer] Accolade MRI EL DL pacemaker for CHB by Dr Johney Frame  . PULSE GENERATOR IMPLANT     Guidant Ultra dual-mode, dual-pacing, dual-sensing generator for complete atrioventricular block 2006 with some noise on atrial lead and generator recall for very low incidence of loss of output    Current Outpatient Medications  Medication Sig  Dispense Refill  . apixaban (ELIQUIS) 5 MG TABS tablet Take 1 tablet (5 mg total) by mouth 2 (two) times daily. 180 tablet 3  . atorvastatin (LIPITOR) 10 MG tablet Take 1 tablet (10 mg total) by mouth every evening. 90 tablet 3  . diltiazem (CARDIZEM CD) 120 MG 24 hr capsule TAKE 1 CAPSULE BY MOUTH EVERY DAY 90 capsule 3  . dorzolamide-timolol (COSOPT) 22.3-6.8 MG/ML ophthalmic solution Place 1 drop into both eyes 2 (two) times daily.    . hydrochlorothiazide (HYDRODIURIL) 12.5 MG tablet TAKE 1 TABLET BY MOUTH EVERY DAY 90 tablet 3  . levothyroxine (SYNTHROID) 75 MCG tablet Take 0.5 tablets (37.5 mcg total) by mouth daily. Except 1 tab ( ) on Sunday. 90 tablet 3  . Multiple Vitamins-Minerals (CENTRUM SILVER 50+WOMEN) TABS Take 1 tablet by mouth daily.    . Multiple Vitamins-Minerals (MULTIVITAMIN WITH MINERALS) tablet Take 1 tablet by mouth daily.     No current facility-administered medications for this visit.    Allergies:   Patient has no known allergies.   Social History:  The patient  reports that she has never smoked. She has never used smokeless tobacco. She reports that she does not drink alcohol or use drugs.   Family History:  The patient's family history includes Cancer in her sister.   ROS:  Please see the history of present illness.   All other systems are personally reviewed and negative.    Exam:    Vital Signs:  Ht 5\' 1"  (1.549 m)  Wt 141 lb (64 kg)   BMI 26.64 kg/m   Well sounding, alert and conversant   Labs/Other Tests and Data Reviewed:    Recent Labs: No results found for requested labs within last 8760 hours.   Wt Readings from Last 3 Encounters:  05/07/19 141 lb (64 kg)  05/01/18 141 lb (64 kg)  01/22/18 142 lb (64.4 kg)     Last device remote is reviewed from [facility] PDF which reveals normal device function   ASSESSMENT & PLAN:    1.  Symptomatic complete heart block Normal device function by recent remote See last PaceArt report  2.    Persistent atrial fibrillation Continue Eliquis for CHADS2VASC of 4 PCP to follow CBC and BMET - ordered for next week for routine monitoring and follow up With advanced age, will monitor renal function twice a year   3.  Hyperlipidemia Continue statin     Follow-up:  Remotes, with me in a year   Patient Risk:  after full review of this patients clinical status, I feel that they are at moderate risk at this time.  Today, I have spent 15 minutes with the patient with telehealth technology discussing arrhythmia management .    Army Fossa, MD  05/07/2019 11:52 AM     [facility] [address] [place] [address] [phone] (office) [phone] (fax)

## 2019-05-12 ENCOUNTER — Ambulatory Visit (INDEPENDENT_AMBULATORY_CARE_PROVIDER_SITE_OTHER): Payer: Medicare HMO | Admitting: *Deleted

## 2019-05-12 DIAGNOSIS — I4891 Unspecified atrial fibrillation: Secondary | ICD-10-CM

## 2019-05-12 LAB — CUP PACEART REMOTE DEVICE CHECK
Battery Remaining Longevity: 162 mo
Battery Remaining Percentage: 100 %
Brady Statistic RA Percent Paced: 0 %
Brady Statistic RV Percent Paced: 93 %
Date Time Interrogation Session: 20210210014200
Implantable Lead Implant Date: 19980323
Implantable Lead Implant Date: 19980323
Implantable Lead Location: 753859
Implantable Lead Location: 753860
Implantable Lead Model: 4524
Implantable Lead Model: 5034
Implantable Pulse Generator Implant Date: 20181017
Lead Channel Impedance Value: 1281 Ohm
Lead Channel Impedance Value: 438 Ohm
Lead Channel Pacing Threshold Amplitude: 1 V
Lead Channel Pacing Threshold Pulse Width: 0.4 ms
Lead Channel Setting Pacing Amplitude: 2.5 V
Lead Channel Setting Pacing Pulse Width: 0.4 ms
Lead Channel Setting Sensing Sensitivity: 4 mV
Pulse Gen Serial Number: 807281

## 2019-05-12 NOTE — Progress Notes (Signed)
PPM Remote  

## 2019-05-13 ENCOUNTER — Other Ambulatory Visit: Payer: Self-pay

## 2019-05-13 ENCOUNTER — Other Ambulatory Visit: Payer: Medicare HMO

## 2019-05-13 DIAGNOSIS — E559 Vitamin D deficiency, unspecified: Secondary | ICD-10-CM

## 2019-05-13 DIAGNOSIS — E034 Atrophy of thyroid (acquired): Secondary | ICD-10-CM | POA: Diagnosis not present

## 2019-05-13 DIAGNOSIS — I10 Essential (primary) hypertension: Secondary | ICD-10-CM

## 2019-05-13 DIAGNOSIS — E782 Mixed hyperlipidemia: Secondary | ICD-10-CM | POA: Diagnosis not present

## 2019-05-13 DIAGNOSIS — I480 Paroxysmal atrial fibrillation: Secondary | ICD-10-CM | POA: Diagnosis not present

## 2019-05-14 ENCOUNTER — Other Ambulatory Visit: Payer: Self-pay | Admitting: Family Medicine

## 2019-05-14 DIAGNOSIS — E559 Vitamin D deficiency, unspecified: Secondary | ICD-10-CM

## 2019-05-14 LAB — CBC
Hematocrit: 45.6 % (ref 34.0–46.6)
Hemoglobin: 15.6 g/dL (ref 11.1–15.9)
MCH: 32.6 pg (ref 26.6–33.0)
MCHC: 34.2 g/dL (ref 31.5–35.7)
MCV: 95 fL (ref 79–97)
Platelets: 211 10*3/uL (ref 150–450)
RBC: 4.78 x10E6/uL (ref 3.77–5.28)
RDW: 12.3 % (ref 11.7–15.4)
WBC: 9.7 10*3/uL (ref 3.4–10.8)

## 2019-05-14 LAB — LIPID PANEL
Chol/HDL Ratio: 4.7 ratio — ABNORMAL HIGH (ref 0.0–4.4)
Cholesterol, Total: 204 mg/dL — ABNORMAL HIGH (ref 100–199)
HDL: 43 mg/dL (ref >39)
LDL Chol Calc (NIH): 131 mg/dL — ABNORMAL HIGH (ref 0–99)
Triglycerides: 170 mg/dL — ABNORMAL HIGH (ref 0–149)
VLDL Cholesterol Cal: 30 mg/dL (ref 5–40)

## 2019-05-14 LAB — CMP14+EGFR
ALT: 25 IU/L (ref 0–32)
AST: 27 IU/L (ref 0–40)
Albumin/Globulin Ratio: 1.4 (ref 1.2–2.2)
Albumin: 4.3 g/dL (ref 3.5–4.6)
Alkaline Phosphatase: 73 IU/L (ref 39–117)
BUN/Creatinine Ratio: 11 — ABNORMAL LOW (ref 12–28)
BUN: 10 mg/dL (ref 10–36)
Bilirubin Total: 0.8 mg/dL (ref 0.0–1.2)
CO2: 25 mmol/L (ref 20–29)
Calcium: 10 mg/dL (ref 8.7–10.3)
Chloride: 99 mmol/L (ref 96–106)
Creatinine, Ser: 0.95 mg/dL (ref 0.57–1.00)
GFR calc Af Amer: 60 mL/min/{1.73_m2} (ref >59)
GFR calc non Af Amer: 52 mL/min/{1.73_m2} — ABNORMAL LOW (ref >59)
Globulin, Total: 3.1 g/dL (ref 1.5–4.5)
Glucose: 118 mg/dL — ABNORMAL HIGH (ref 65–99)
Potassium: 4 mmol/L (ref 3.5–5.2)
Sodium: 140 mmol/L (ref 134–144)
Total Protein: 7.4 g/dL (ref 6.0–8.5)

## 2019-05-14 LAB — THYROID PANEL WITH TSH
Free Thyroxine Index: 2.3 (ref 1.2–4.9)
T3 Uptake Ratio: 30 % (ref 24–39)
T4, Total: 7.8 ug/dL (ref 4.5–12.0)
TSH: 3.15 u[IU]/mL (ref 0.450–4.500)

## 2019-05-14 LAB — VITAMIN D 25 HYDROXY (VIT D DEFICIENCY, FRACTURES): Vit D, 25-Hydroxy: 25.2 ng/mL — ABNORMAL LOW (ref 30.0–100.0)

## 2019-05-14 MED ORDER — CHOLECALCIFEROL 1.25 MG (50000 UT) PO CAPS: 50000.0000 [IU] | ORAL_CAPSULE | ORAL | 0 refills | Status: DC

## 2019-07-07 ENCOUNTER — Other Ambulatory Visit: Payer: Self-pay

## 2019-07-07 ENCOUNTER — Ambulatory Visit (INDEPENDENT_AMBULATORY_CARE_PROVIDER_SITE_OTHER): Payer: Medicare HMO

## 2019-07-07 ENCOUNTER — Ambulatory Visit (INDEPENDENT_AMBULATORY_CARE_PROVIDER_SITE_OTHER): Payer: Medicare HMO | Admitting: Family Medicine

## 2019-07-07 ENCOUNTER — Encounter: Payer: Self-pay | Admitting: Family Medicine

## 2019-07-07 VITALS — BP 139/65 | HR 60 | Temp 98.4°F | Ht 61.0 in | Wt 141.4 lb

## 2019-07-07 DIAGNOSIS — M25562 Pain in left knee: Secondary | ICD-10-CM

## 2019-07-07 DIAGNOSIS — S8002XA Contusion of left knee, initial encounter: Secondary | ICD-10-CM | POA: Diagnosis not present

## 2019-07-07 NOTE — Progress Notes (Signed)
Chief Complaint  Patient presents with  . Knee Pain    left, 2 week, no injury    HPI  Patient presents today for 2 weeks on the left knee.  She was working in her yard and felt a bit of a pop.  It seemed harmless but she decided to stop and work it out just by swinging her knee back and forth.  The next day she woke up with significant bruising up and down the thigh and leg.  It has been moderately painful since that time.  She is able to ambulate.  PMH: Smoking status noted ROS: Per HPI  Objective: BP 139/65   Pulse 60   Temp 98.4 F (36.9 C) (Temporal)   Ht 5\' 1"  (1.549 m)   Wt 141 lb 6.4 oz (64.1 kg)   BMI 26.72 kg/m  Gen: NAD, alert, cooperative with exam HEENT: NCAT, EOMI, PERRL CV: RRR, good S1/S2, no murmur Ext: No edema, warm.  There is extensive bruising of the inner thigh midway to the groin.  There is bruising over the anterior joint line of the knee and the medial aspect of the left calf.  There is full range of motion for the left knee.  The Lachman test is negative.  McMurray is positive for a loud pop medially.  The collateral stress maneuvers although uncomfortable do not cause opening of the joint.  Neuro: Alert and oriented, No gross deficits X-ray left knee: There is extensive degenerative change with narrowing of the medial joint space bilaterally.  No acute fracture noted. Assessment and plan:  1. Acute pain of left knee     With the McMurray test being positive and the extensiveness of the bruising she almost certainly has a meniscus tear.  However at this point she does not desire further work-up.  The pain is getting better daily.  She just wanted to be sure it was not something dangerous.  I reassured her that the bruising should resolve spontaneously with time.  She can use Tylenol for pain as needed.  Follow-up if symptoms worsen.  Orders Placed This Encounter  Procedures  . DG Knee 1-2 Views Left    Standing Status:   Future    Number of  Occurrences:   1    Standing Expiration Date:   09/05/2020    Order Specific Question:   Reason for Exam (SYMPTOM  OR DIAGNOSIS REQUIRED)    Answer:   pain, bruising, popping, NKI    Order Specific Question:   Preferred imaging location?    Answer:   Internal    Follow up as needed.  11/05/2020, MD

## 2019-08-02 ENCOUNTER — Other Ambulatory Visit: Payer: Self-pay | Admitting: Family Medicine

## 2019-08-02 DIAGNOSIS — E559 Vitamin D deficiency, unspecified: Secondary | ICD-10-CM

## 2019-08-06 ENCOUNTER — Telehealth: Payer: Self-pay

## 2019-08-06 NOTE — Telephone Encounter (Signed)
I spoke with the pt daughter Lupita Leash and let her know eventually the monitor will not work. I told her the bsx rep will be contacting them on Monday to get a new monitor to her.

## 2019-08-11 ENCOUNTER — Ambulatory Visit (INDEPENDENT_AMBULATORY_CARE_PROVIDER_SITE_OTHER): Payer: Medicare HMO | Admitting: *Deleted

## 2019-08-11 DIAGNOSIS — I442 Atrioventricular block, complete: Secondary | ICD-10-CM

## 2019-08-11 DIAGNOSIS — I4891 Unspecified atrial fibrillation: Secondary | ICD-10-CM

## 2019-08-11 LAB — CUP PACEART REMOTE DEVICE CHECK
Battery Remaining Longevity: 162 mo
Battery Remaining Percentage: 100 %
Brady Statistic RA Percent Paced: 0 %
Brady Statistic RV Percent Paced: 94 %
Date Time Interrogation Session: 20210512014100
Implantable Lead Implant Date: 19980323
Implantable Lead Implant Date: 19980323
Implantable Lead Location: 753859
Implantable Lead Location: 753860
Implantable Lead Model: 4524
Implantable Lead Model: 5034
Implantable Pulse Generator Implant Date: 20181017
Lead Channel Impedance Value: 1212 Ohm
Lead Channel Impedance Value: 411 Ohm
Lead Channel Pacing Threshold Amplitude: 1 V
Lead Channel Pacing Threshold Pulse Width: 0.4 ms
Lead Channel Setting Pacing Amplitude: 2.5 V
Lead Channel Setting Pacing Pulse Width: 0.4 ms
Lead Channel Setting Sensing Sensitivity: 4 mV
Pulse Gen Serial Number: 807281

## 2019-08-13 NOTE — Progress Notes (Signed)
Remote pacemaker transmission.   

## 2019-10-13 ENCOUNTER — Other Ambulatory Visit: Payer: Self-pay | Admitting: Family Medicine

## 2019-10-13 DIAGNOSIS — E782 Mixed hyperlipidemia: Secondary | ICD-10-CM

## 2019-10-13 NOTE — Telephone Encounter (Signed)
30 day supply given today  Needs appt for further refills

## 2019-10-14 ENCOUNTER — Other Ambulatory Visit: Payer: Self-pay | Admitting: Family Medicine

## 2019-10-14 DIAGNOSIS — E034 Atrophy of thyroid (acquired): Secondary | ICD-10-CM

## 2019-10-27 ENCOUNTER — Other Ambulatory Visit: Payer: Self-pay | Admitting: Family Medicine

## 2019-10-27 DIAGNOSIS — E559 Vitamin D deficiency, unspecified: Secondary | ICD-10-CM

## 2019-11-09 ENCOUNTER — Other Ambulatory Visit: Payer: Self-pay | Admitting: Family Medicine

## 2019-11-09 DIAGNOSIS — E782 Mixed hyperlipidemia: Secondary | ICD-10-CM

## 2019-11-10 ENCOUNTER — Ambulatory Visit (INDEPENDENT_AMBULATORY_CARE_PROVIDER_SITE_OTHER): Payer: Medicare HMO | Admitting: *Deleted

## 2019-11-10 DIAGNOSIS — I442 Atrioventricular block, complete: Secondary | ICD-10-CM | POA: Diagnosis not present

## 2019-11-10 LAB — CUP PACEART REMOTE DEVICE CHECK
Battery Remaining Longevity: 162 mo
Battery Remaining Percentage: 100 %
Brady Statistic RA Percent Paced: 0 %
Brady Statistic RV Percent Paced: 95 %
Date Time Interrogation Session: 20210811014200
Implantable Lead Implant Date: 19980323
Implantable Lead Implant Date: 19980323
Implantable Lead Location: 753859
Implantable Lead Location: 753860
Implantable Lead Model: 4524
Implantable Lead Model: 5034
Implantable Pulse Generator Implant Date: 20181017
Lead Channel Impedance Value: 1250 Ohm
Lead Channel Impedance Value: 436 Ohm
Lead Channel Pacing Threshold Amplitude: 1 V
Lead Channel Pacing Threshold Pulse Width: 0.4 ms
Lead Channel Setting Pacing Amplitude: 2.5 V
Lead Channel Setting Pacing Pulse Width: 0.4 ms
Lead Channel Setting Sensing Sensitivity: 4 mV
Pulse Gen Serial Number: 807281

## 2019-11-10 NOTE — Telephone Encounter (Signed)
[facility]. NTBS 30 days given 10/13/19 

## 2019-11-15 NOTE — Progress Notes (Signed)
Remote pacemaker transmission.   

## 2019-12-14 ENCOUNTER — Other Ambulatory Visit: Payer: Self-pay | Admitting: Family Medicine

## 2019-12-14 DIAGNOSIS — E782 Mixed hyperlipidemia: Secondary | ICD-10-CM

## 2019-12-15 NOTE — Telephone Encounter (Signed)
Patient needs to be seen for refills

## 2020-01-07 ENCOUNTER — Other Ambulatory Visit: Payer: Self-pay | Admitting: Nurse Practitioner

## 2020-01-07 DIAGNOSIS — E782 Mixed hyperlipidemia: Secondary | ICD-10-CM

## 2020-01-07 NOTE — Telephone Encounter (Signed)
[facility]. NTBS 30 days given 12/16/19

## 2020-01-11 NOTE — Telephone Encounter (Signed)
LM - 10/12-jhb

## 2020-01-19 ENCOUNTER — Other Ambulatory Visit: Payer: Self-pay | Admitting: Nurse Practitioner

## 2020-01-19 DIAGNOSIS — E782 Mixed hyperlipidemia: Secondary | ICD-10-CM

## 2020-01-20 DIAGNOSIS — E039 Hypothyroidism, unspecified: Secondary | ICD-10-CM | POA: Diagnosis not present

## 2020-01-20 DIAGNOSIS — H409 Unspecified glaucoma: Secondary | ICD-10-CM | POA: Diagnosis not present

## 2020-01-20 DIAGNOSIS — I739 Peripheral vascular disease, unspecified: Secondary | ICD-10-CM | POA: Diagnosis not present

## 2020-01-20 DIAGNOSIS — Z950 Presence of cardiac pacemaker: Secondary | ICD-10-CM | POA: Diagnosis not present

## 2020-01-20 DIAGNOSIS — K08109 Complete loss of teeth, unspecified cause, unspecified class: Secondary | ICD-10-CM | POA: Diagnosis not present

## 2020-01-20 DIAGNOSIS — I10 Essential (primary) hypertension: Secondary | ICD-10-CM | POA: Diagnosis not present

## 2020-01-20 DIAGNOSIS — E785 Hyperlipidemia, unspecified: Secondary | ICD-10-CM | POA: Diagnosis not present

## 2020-01-20 DIAGNOSIS — Z7901 Long term (current) use of anticoagulants: Secondary | ICD-10-CM | POA: Diagnosis not present

## 2020-01-20 DIAGNOSIS — D6869 Other thrombophilia: Secondary | ICD-10-CM | POA: Diagnosis not present

## 2020-01-20 DIAGNOSIS — Z008 Encounter for other general examination: Secondary | ICD-10-CM | POA: Diagnosis not present

## 2020-01-20 DIAGNOSIS — I4891 Unspecified atrial fibrillation: Secondary | ICD-10-CM | POA: Diagnosis not present

## 2020-02-05 ENCOUNTER — Other Ambulatory Visit: Payer: Self-pay | Admitting: Nurse Practitioner

## 2020-02-05 DIAGNOSIS — E782 Mixed hyperlipidemia: Secondary | ICD-10-CM

## 2020-02-09 ENCOUNTER — Ambulatory Visit (INDEPENDENT_AMBULATORY_CARE_PROVIDER_SITE_OTHER): Payer: Medicare HMO

## 2020-02-09 DIAGNOSIS — I495 Sick sinus syndrome: Secondary | ICD-10-CM

## 2020-02-09 LAB — CUP PACEART REMOTE DEVICE CHECK
Battery Remaining Longevity: 156 mo
Battery Remaining Percentage: 100 %
Brady Statistic RA Percent Paced: 0 %
Brady Statistic RV Percent Paced: 95 %
Date Time Interrogation Session: 20211110014200
Implantable Lead Implant Date: 19980323
Implantable Lead Implant Date: 19980323
Implantable Lead Location: 753859
Implantable Lead Location: 753860
Implantable Lead Model: 4524
Implantable Lead Model: 5034
Implantable Pulse Generator Implant Date: 20181017
Lead Channel Impedance Value: 1263 Ohm
Lead Channel Impedance Value: 423 Ohm
Lead Channel Pacing Threshold Amplitude: 1 V
Lead Channel Pacing Threshold Pulse Width: 0.4 ms
Lead Channel Setting Pacing Amplitude: 2.5 V
Lead Channel Setting Pacing Pulse Width: 0.4 ms
Lead Channel Setting Sensing Sensitivity: 4 mV
Pulse Gen Serial Number: 807281

## 2020-02-10 NOTE — Progress Notes (Signed)
Remote pacemaker transmission.   

## 2020-02-18 ENCOUNTER — Other Ambulatory Visit: Payer: Self-pay | Admitting: Nurse Practitioner

## 2020-02-18 DIAGNOSIS — E782 Mixed hyperlipidemia: Secondary | ICD-10-CM

## 2020-05-05 ENCOUNTER — Encounter: Payer: Medicare HMO | Admitting: Internal Medicine

## 2020-05-10 ENCOUNTER — Ambulatory Visit (INDEPENDENT_AMBULATORY_CARE_PROVIDER_SITE_OTHER): Payer: Medicare HMO

## 2020-05-10 DIAGNOSIS — I442 Atrioventricular block, complete: Secondary | ICD-10-CM | POA: Diagnosis not present

## 2020-05-12 LAB — CUP PACEART REMOTE DEVICE CHECK
Battery Remaining Longevity: 150 mo
Battery Remaining Percentage: 100 %
Brady Statistic RA Percent Paced: 0 %
Brady Statistic RV Percent Paced: 96 %
Date Time Interrogation Session: 20220209014000
Implantable Lead Implant Date: 19980323
Implantable Lead Implant Date: 19980323
Implantable Lead Location: 753859
Implantable Lead Location: 753860
Implantable Lead Model: 4524
Implantable Lead Model: 5034
Implantable Pulse Generator Implant Date: 20181017
Lead Channel Impedance Value: 1296 Ohm
Lead Channel Impedance Value: 429 Ohm
Lead Channel Pacing Threshold Amplitude: 0.9 V
Lead Channel Pacing Threshold Pulse Width: 0.4 ms
Lead Channel Setting Pacing Amplitude: 2.5 V
Lead Channel Setting Pacing Pulse Width: 0.4 ms
Lead Channel Setting Sensing Sensitivity: 4 mV
Pulse Gen Serial Number: 807281

## 2020-05-16 NOTE — Progress Notes (Signed)
Remote pacemaker transmission.   

## 2020-05-17 ENCOUNTER — Other Ambulatory Visit: Payer: Self-pay | Admitting: Family Medicine

## 2020-05-17 DIAGNOSIS — I480 Paroxysmal atrial fibrillation: Secondary | ICD-10-CM

## 2020-05-24 DIAGNOSIS — H353230 Exudative age-related macular degeneration, bilateral, stage unspecified: Secondary | ICD-10-CM | POA: Diagnosis not present

## 2020-05-24 DIAGNOSIS — Z961 Presence of intraocular lens: Secondary | ICD-10-CM | POA: Diagnosis not present

## 2020-05-24 DIAGNOSIS — H401130 Primary open-angle glaucoma, bilateral, stage unspecified: Secondary | ICD-10-CM | POA: Diagnosis not present

## 2020-06-05 ENCOUNTER — Other Ambulatory Visit: Payer: Self-pay | Admitting: Family Medicine

## 2020-06-05 DIAGNOSIS — I10 Essential (primary) hypertension: Secondary | ICD-10-CM

## 2020-06-05 NOTE — Telephone Encounter (Signed)
Gott[facility]. NTBS last OV 05/05/19

## 2020-06-19 ENCOUNTER — Other Ambulatory Visit: Payer: Self-pay | Admitting: Family Medicine

## 2020-06-19 DIAGNOSIS — I480 Paroxysmal atrial fibrillation: Secondary | ICD-10-CM

## 2020-06-19 NOTE — Telephone Encounter (Signed)
[facility]. 30 days given 05/17/20

## 2020-06-22 ENCOUNTER — Other Ambulatory Visit: Payer: Self-pay | Admitting: Nurse Practitioner

## 2020-06-22 DIAGNOSIS — E782 Mixed hyperlipidemia: Secondary | ICD-10-CM

## 2020-06-30 DIAGNOSIS — H353231 Exudative age-related macular degeneration, bilateral, with active choroidal neovascularization: Secondary | ICD-10-CM | POA: Diagnosis not present

## 2020-07-28 DIAGNOSIS — H353211 Exudative age-related macular degeneration, right eye, with active choroidal neovascularization: Secondary | ICD-10-CM | POA: Diagnosis not present

## 2020-07-28 DIAGNOSIS — H353221 Exudative age-related macular degeneration, left eye, with active choroidal neovascularization: Secondary | ICD-10-CM | POA: Diagnosis not present

## 2020-07-28 DIAGNOSIS — H353231 Exudative age-related macular degeneration, bilateral, with active choroidal neovascularization: Secondary | ICD-10-CM | POA: Diagnosis not present

## 2020-08-09 ENCOUNTER — Ambulatory Visit (INDEPENDENT_AMBULATORY_CARE_PROVIDER_SITE_OTHER): Payer: Medicare HMO

## 2020-08-09 DIAGNOSIS — I495 Sick sinus syndrome: Secondary | ICD-10-CM | POA: Diagnosis not present

## 2020-08-09 LAB — CUP PACEART REMOTE DEVICE CHECK
Battery Remaining Longevity: 150 mo
Battery Remaining Percentage: 100 %
Brady Statistic RA Percent Paced: 0 %
Brady Statistic RV Percent Paced: 96 %
Date Time Interrogation Session: 20220511014100
Implantable Lead Implant Date: 19980323
Implantable Lead Implant Date: 19980323
Implantable Lead Location: 753859
Implantable Lead Location: 753860
Implantable Lead Model: 4524
Implantable Lead Model: 5034
Implantable Pulse Generator Implant Date: 20181017
Lead Channel Impedance Value: 1170 Ohm
Lead Channel Impedance Value: 405 Ohm
Lead Channel Pacing Threshold Amplitude: 1 V
Lead Channel Pacing Threshold Pulse Width: 0.4 ms
Lead Channel Setting Pacing Amplitude: 2.5 V
Lead Channel Setting Pacing Pulse Width: 0.4 ms
Lead Channel Setting Sensing Sensitivity: 4 mV
Pulse Gen Serial Number: 807281

## 2020-08-21 ENCOUNTER — Other Ambulatory Visit: Payer: Self-pay | Admitting: Family Medicine

## 2020-08-21 ENCOUNTER — Other Ambulatory Visit: Payer: Self-pay | Admitting: Nurse Practitioner

## 2020-08-21 DIAGNOSIS — I480 Paroxysmal atrial fibrillation: Secondary | ICD-10-CM

## 2020-08-21 DIAGNOSIS — E782 Mixed hyperlipidemia: Secondary | ICD-10-CM

## 2020-08-21 DIAGNOSIS — I10 Essential (primary) hypertension: Secondary | ICD-10-CM

## 2020-08-25 DIAGNOSIS — H353221 Exudative age-related macular degeneration, left eye, with active choroidal neovascularization: Secondary | ICD-10-CM | POA: Diagnosis not present

## 2020-08-25 DIAGNOSIS — H353211 Exudative age-related macular degeneration, right eye, with active choroidal neovascularization: Secondary | ICD-10-CM | POA: Diagnosis not present

## 2020-08-25 DIAGNOSIS — H353231 Exudative age-related macular degeneration, bilateral, with active choroidal neovascularization: Secondary | ICD-10-CM | POA: Diagnosis not present

## 2020-08-31 NOTE — Progress Notes (Signed)
Remote pacemaker transmission.   

## 2020-09-14 ENCOUNTER — Other Ambulatory Visit: Payer: Self-pay | Admitting: Family Medicine

## 2020-09-14 DIAGNOSIS — I480 Paroxysmal atrial fibrillation: Secondary | ICD-10-CM

## 2020-09-14 DIAGNOSIS — I10 Essential (primary) hypertension: Secondary | ICD-10-CM

## 2020-09-21 ENCOUNTER — Other Ambulatory Visit: Payer: Self-pay | Admitting: Nurse Practitioner

## 2020-09-21 ENCOUNTER — Other Ambulatory Visit: Payer: Self-pay | Admitting: Family Medicine

## 2020-09-21 DIAGNOSIS — E782 Mixed hyperlipidemia: Secondary | ICD-10-CM

## 2020-09-21 DIAGNOSIS — I480 Paroxysmal atrial fibrillation: Secondary | ICD-10-CM

## 2020-09-22 DIAGNOSIS — H353231 Exudative age-related macular degeneration, bilateral, with active choroidal neovascularization: Secondary | ICD-10-CM | POA: Diagnosis not present

## 2020-09-25 ENCOUNTER — Other Ambulatory Visit: Payer: Self-pay | Admitting: Family Medicine

## 2020-09-25 DIAGNOSIS — I480 Paroxysmal atrial fibrillation: Secondary | ICD-10-CM

## 2020-09-29 ENCOUNTER — Telehealth: Payer: Self-pay | Admitting: Family Medicine

## 2020-09-29 DIAGNOSIS — I480 Paroxysmal atrial fibrillation: Secondary | ICD-10-CM

## 2020-09-29 MED ORDER — APIXABAN 5 MG PO TABS: 5.0000 mg | ORAL_TABLET | Freq: Two times a day (BID) | ORAL | 1 refills | Status: DC

## 2020-09-29 NOTE — Telephone Encounter (Signed)
Med sent to pharmacy, daughter aware via voicemail

## 2020-09-29 NOTE — Telephone Encounter (Signed)
  Prescription Request  09/29/2020  What is the name of the medication or equipment? Eloquise  Have you contacted your pharmacy to request a refill? (if applicable) yes  Which pharmacy would you like this sent to? [facility]   Patient notified that their request is being sent to the clinical staff for review and that they should receive a response within 2 business days.    Gottschalk's pt.  She was told to make an appt, so she did.  She is completely out of her meds.  I told her to keep her appt bc it was 1st available.

## 2020-10-03 ENCOUNTER — Other Ambulatory Visit: Payer: Self-pay | Admitting: Nurse Practitioner

## 2020-10-03 DIAGNOSIS — E782 Mixed hyperlipidemia: Secondary | ICD-10-CM

## 2020-10-05 ENCOUNTER — Other Ambulatory Visit: Payer: Self-pay | Admitting: Family Medicine

## 2020-10-05 DIAGNOSIS — E782 Mixed hyperlipidemia: Secondary | ICD-10-CM

## 2020-10-20 DIAGNOSIS — H353211 Exudative age-related macular degeneration, right eye, with active choroidal neovascularization: Secondary | ICD-10-CM | POA: Diagnosis not present

## 2020-10-20 DIAGNOSIS — H353221 Exudative age-related macular degeneration, left eye, with active choroidal neovascularization: Secondary | ICD-10-CM | POA: Diagnosis not present

## 2020-10-23 ENCOUNTER — Other Ambulatory Visit: Payer: Self-pay | Admitting: Family Medicine

## 2020-10-23 DIAGNOSIS — E559 Vitamin D deficiency, unspecified: Secondary | ICD-10-CM

## 2020-10-23 DIAGNOSIS — E034 Atrophy of thyroid (acquired): Secondary | ICD-10-CM

## 2020-10-28 ENCOUNTER — Other Ambulatory Visit: Payer: Self-pay | Admitting: Family Medicine

## 2020-10-28 DIAGNOSIS — E782 Mixed hyperlipidemia: Secondary | ICD-10-CM

## 2020-11-08 ENCOUNTER — Ambulatory Visit (INDEPENDENT_AMBULATORY_CARE_PROVIDER_SITE_OTHER): Payer: Medicare HMO

## 2020-11-08 DIAGNOSIS — I495 Sick sinus syndrome: Secondary | ICD-10-CM

## 2020-11-08 LAB — CUP PACEART REMOTE DEVICE CHECK
Battery Remaining Longevity: 150 mo
Battery Remaining Percentage: 100 %
Brady Statistic RA Percent Paced: 0 %
Brady Statistic RV Percent Paced: 95 %
Date Time Interrogation Session: 20220810014100
Implantable Lead Implant Date: 19980323
Implantable Lead Implant Date: 19980323
Implantable Lead Location: 753859
Implantable Lead Location: 753860
Implantable Lead Model: 4524
Implantable Lead Model: 5034
Implantable Pulse Generator Implant Date: 20181017
Lead Channel Impedance Value: 1210 Ohm
Lead Channel Impedance Value: 413 Ohm
Lead Channel Pacing Threshold Amplitude: 1 V
Lead Channel Pacing Threshold Pulse Width: 0.4 ms
Lead Channel Setting Pacing Amplitude: 2.5 V
Lead Channel Setting Pacing Pulse Width: 0.4 ms
Lead Channel Setting Sensing Sensitivity: 4 mV
Pulse Gen Serial Number: 807281

## 2020-11-12 ENCOUNTER — Other Ambulatory Visit: Payer: Self-pay | Admitting: Family Medicine

## 2020-11-12 DIAGNOSIS — E782 Mixed hyperlipidemia: Secondary | ICD-10-CM

## 2020-11-14 NOTE — Telephone Encounter (Signed)
DENIED>  Last rx 8/1 given to last 1 month until she is seen as it has been almost 2 years since seen in office for chronic care.  Last OV 05/2019 was TELEVISIT.

## 2020-11-24 DIAGNOSIS — H353211 Exudative age-related macular degeneration, right eye, with active choroidal neovascularization: Secondary | ICD-10-CM | POA: Diagnosis not present

## 2020-11-24 DIAGNOSIS — H353221 Exudative age-related macular degeneration, left eye, with active choroidal neovascularization: Secondary | ICD-10-CM | POA: Diagnosis not present

## 2020-11-28 ENCOUNTER — Encounter: Payer: Self-pay | Admitting: Family Medicine

## 2020-11-28 ENCOUNTER — Other Ambulatory Visit: Payer: Self-pay

## 2020-11-28 ENCOUNTER — Ambulatory Visit (INDEPENDENT_AMBULATORY_CARE_PROVIDER_SITE_OTHER): Payer: Medicare HMO | Admitting: Family Medicine

## 2020-11-28 VITALS — BP 134/77 | HR 62 | Temp 97.9°F | Ht 61.0 in | Wt 107.6 lb

## 2020-11-28 DIAGNOSIS — F0390 Unspecified dementia without behavioral disturbance: Secondary | ICD-10-CM

## 2020-11-28 DIAGNOSIS — E782 Mixed hyperlipidemia: Secondary | ICD-10-CM

## 2020-11-28 DIAGNOSIS — I480 Paroxysmal atrial fibrillation: Secondary | ICD-10-CM

## 2020-11-28 DIAGNOSIS — E559 Vitamin D deficiency, unspecified: Secondary | ICD-10-CM

## 2020-11-28 DIAGNOSIS — R634 Abnormal weight loss: Secondary | ICD-10-CM

## 2020-11-28 DIAGNOSIS — I10 Essential (primary) hypertension: Secondary | ICD-10-CM

## 2020-11-28 DIAGNOSIS — E034 Atrophy of thyroid (acquired): Secondary | ICD-10-CM

## 2020-11-28 DIAGNOSIS — R69 Illness, unspecified: Secondary | ICD-10-CM | POA: Diagnosis not present

## 2020-11-28 DIAGNOSIS — F03A0 Unspecified dementia, mild, without behavioral disturbance, psychotic disturbance, mood disturbance, and anxiety: Secondary | ICD-10-CM

## 2020-11-28 DIAGNOSIS — R739 Hyperglycemia, unspecified: Secondary | ICD-10-CM | POA: Diagnosis not present

## 2020-11-28 LAB — BAYER DCA HB A1C WAIVED: HB A1C (BAYER DCA - WAIVED): 5.6 % (ref <7.0)

## 2020-11-28 NOTE — Progress Notes (Signed)
Subjective: IP:JASNK up PCP: Janora Norlander, DO [name] [name] is a 87 y.o. female presenting to clinic today for:  1. Afib/ HTN/ HLD Compliant with Eliquis, Cardizem, Lipitor and hydrochlorothiazide.  She is accompanied today by her family members, who voiced concern about her living independently.  Patient denies any falls, visualized rectal bleeding.  She has had some weight loss since our last visit.  Her appetite is fair.  She reports eating breakfast and supper regularly but occasionally not eating lunch.  She hydrates but does not exactly quantify how much fluid she takes except for that she knows she takes 2 bottles and various amounts of tap water per day.  No reports of change in bowel habits, heart palpitations  2. Hypothyroidism/memory concerns Compliant with Synthroid.  Reports weight loss as above.  Concerns for memory.  No wandering.  Her family members report that she is sometimes confused.  No reports of leaving stove on, visual or auditory hallucinations.  She feels that her sleep is good.  3. Vit D Deficiency Compliant with vitamin D supplement   ROS: Per HPI  No Known Allergies Past Medical History:  Diagnosis Date   Atrioventricular block, complete ([place]) 2006   Hyperlipidemia    Hypertension    Paroxysmal atrial fibrillation ([place]) 2016   single episode detected on PPM interrogation    Current Outpatient Medications:    apixaban (ELIQUIS) 5 MG TABS tablet, Take 1 tablet (5 mg total) by mouth 2 (two) times daily. (Needs to be seen before next refill), Disp: 60 tablet, Rfl: 1   atorvastatin (LIPITOR) 10 MG tablet, Take 1 tablet (10 mg total) by mouth every evening., Disp: 30 tablet, Rfl: 0   Cholecalciferol (VITAMIN D3) 1.25 MG (50000 UT) CAPS, TAKE 1 CAPSULE (50,000 UNITS TOTAL) BY MOUTH EVERY 7 (SEVEN) DAYS. X12 WEEKS, Disp: 12 capsule, Rfl: 0   diltiazem (CARDIZEM CD) 120 MG 24 hr capsule, TAKE 1 CAPSULE BY MOUTH EVERY DAY, Disp: 90 capsule, Rfl: 3    dorzolamide-timolol (COSOPT) 22.3-6.8 MG/ML ophthalmic solution, Place 1 drop into both eyes 2 (two) times daily., Disp: , Rfl:    hydrochlorothiazide (HYDRODIURIL) 12.5 MG tablet, TAKE 1 TABLET BY MOUTH EVERY DAY, Disp: 90 tablet, Rfl: 3   levothyroxine (SYNTHROID) 75 MCG tablet, TAKE 1/2 TABLET (37.5 MCG TOTAL) BY MOUTH DAILY. EXCEPT 1 TAB (75MCG) ON SUNDAY., Disp: 50 tablet, Rfl: 0   Multiple Vitamins-Minerals (CENTRUM SILVER 50+WOMEN) TABS, Take 1 tablet by mouth daily., Disp: , Rfl:    Multiple Vitamins-Minerals (MULTIVITAMIN WITH MINERALS) tablet, Take 1 tablet by mouth daily., Disp: , Rfl:  Social History   Socioeconomic History   Marital status: Widowed    Spouse name: Not on file   Number of children: Not on file   Years of education: Not on file   Highest education level: Not on file  Occupational History   Not on file  Tobacco Use   Smoking status: Never   Smokeless tobacco: Never  Substance and Sexual Activity   Alcohol use: No    Alcohol/week: 0.0 standard drinks   Drug use: No   Sexual activity: Not on file  Other Topics Concern   Not on file  Social History Narrative   Not on file   Social Determinants of Health   Financial Resource Strain: Not on file  Food Insecurity: Not on file  Transportation Needs: Not on file  Physical Activity: Not on file  Stress: Not on file  Social Connections: Not  on file  Intimate Partner Violence: Not on file   Family History  Problem Relation Age of Onset   Cancer Sister        breast    Objective: Office vital signs reviewed. BP 134/77   Pulse 62   Temp 97.9 F (36.6 C)   Ht $R'5\' 1"'JD$  (1.549 m)   Wt 107 lb 9.6 oz (48.8 kg)   SpO2 95%   BMI 20.33 kg/m   Physical Examination:  General: Awake, alert, well-appearing elderly female, No acute distress HEENT: Normal; sclera white.  No exophthalmos.  No goiter Cardio: regular rate and rhythm, S1S2 heard, no murmurs appreciated Pulm: clear to auscultation bilaterally, no  wheezes, rhonchi or rales; normal work of breathing on room air GI: soft, non-tender, non-distended  Extremities: warm, well perfused, trace ankle edema, no cyanosis or clubbing; +2 pulses bilaterally MSK: Ambulating independently.  Gait slightly unstable with position change but she is able to do this successfully.  MMSE - Mini Mental State Exam 11/28/2020  Orientation to time 2  Orientation to Place 5  Registration 1  Attention/ Calculation 3  Recall 1  Language- name 2 objects 2  Language- repeat 1  Language- follow 3 step command 3  Language- read & follow direction 1  Write a sentence 1  Copy design 1  Total score 21     Assessment/ Plan: 87 y.o. female   Mild dementia ([place]) - Plan: Vitamin B12, RPR  Unexplained weight loss - Plan: Fecal occult blood, imunochemical  Hypothyroidism due to acquired atrophy of thyroid - Plan: CMP14+EGFR, TSH, T4, Free  Paroxysmal atrial fibrillation (HCC) - Plan: CMP14+EGFR, CBC  Essential hypertension, benign - Plan: CMP14+EGFR  Mixed hyperlipidemia - Plan: CMP14+EGFR, Lipid Panel  Vitamin D deficiency - Plan: VITAMIN D 25 Hydroxy (Vit-D Deficiency, Fractures)  Elevated serum glucose - Plan: Bayer DCA Hb A1c Waived  MMSE demonstrates findings consistent with a mild dementia.  We will check vitamin B12, RPR and thyroid labs to ensure that this is not a metabolic issue.  I do wonder if her unexplained weight loss is secondary to these changes versus from overtreated thyroid versus from an unrecognized cancer.  She seemed fairly vigorous today.  She does not quite fit a failure to thrive picture but this could be kept in mind as well given her advanced age  She seemed both rate and rhythm controlled today.  Check CBC, CMP  Blood pressure at goal.  Continue current regimen  Currently treated with statins at lipid panel and liver function test ordered  Check vitamin D level  A1c today did not show any evidence of diabetes  We will  plan to start Namenda pending her metabolic labs.  I would like to see her back in about 6 weeks for recheck weight and to see how she is doing with the memory medicine.  May need to consider having someone live with her versus she living with someone else versus placement if her memory continues to decline or she develops any other concerning symptoms or signs  No orders of the defined types were placed in this encounter.  No orders of the defined types were placed in this encounter.    Janora Norlander, DO [place] [phone]

## 2020-11-28 NOTE — Patient Instructions (Signed)
Your testing showed mild dementia today.  This is memory loss that often occurs as one ages.  There are medications that can help SLOW the progression but nothing to reverse the memory loss. I am worried about your weight loss. Sometimes this is caused by thyroid so we will check that today.  Sometimes it is a result of dementia and sometimes it can indicate a cancer that hasn't been recognized.  I am sending you home with a stool card that I would like returned ASAP. Dementia Dementia is a condition that affects the way the brain works. It often affects memory and thinking. There are many types of dementia. Some types get worse with time and cannot be reversed. Some types of dementia include: Alzheimer's disease. This is the most common type. Vascular dementia. This type may happen due to a stroke. Lewy body dementia. This type may happen to people who have Parkinson's disease. Frontotemporal dementia. This type is caused by damage to nerve cells in certain parts of the brain. Some people may have more than one type. What are the causes? This condition is caused by damage to cells in the brain. Some causes that cannot be reversed include: Having a condition that affects the blood vessels of the brain, such as diabetes, heart disease, or blood vessel disease. Changes to genes. Some causes that can be reversed or slowed include: Injury to the brain. Certain medicines. Infection. Not having enough vitamin B12 in the body, or thyroid problems. A tumor, blood clot, or too much fluid in the brain. Certain diseases that cause your body's defense system (immune system) to attack healthy parts of the body. What are the signs or symptoms? Problems remembering events or people. Having trouble taking a bath or putting clothes on. Forgetting appointments. Forgetting to pay bills. Trouble planning and making meals. Having trouble speaking. Getting lost easily. Changes in behavior or mood. How is this  treated? Treatment depends on the cause of the dementia. It might include: Taking medicines for symptoms or to help control or slow down the dementia. Treating the cause of your dementia. Your doctor can help you find support groups and other doctors who can helpwith your care. Follow these instructions at home: Medicines Take over-the-counter and prescription medicines only as told by your doctor. Use a pill organizer to help you manage your medicines. Avoidtaking medicines for pain or for sleep. Lifestyle Make healthy choices: Be active as told by your doctor. Do not smoke or use any products that contain nicotine or tobacco. If you need help quitting, ask your doctor. Do not drink alcohol. When you get stressed, do something that will help you relax. Your doctor can give you tips. Spend time with other people. Make sure you get good sleep. To get good sleep: Try not to take naps during the day. Keep your bedroom dark and cool. In the few hours before you go to bed, try not to do any exercise. Do not have foods and drinks with caffeine at night. Eating and drinking Drink enough fluid to keep your pee (urine) pale yellow. Eat a healthy diet. General instructions  Talk with your doctor to figure out: What you need help with. What your safety needs are. Ask your doctor if it is safe for you to drive. If told, wear a bracelet that tracks where you are or shows that you are a person with memory loss. Work with your family to make big decisions. Keep all follow-up visits.  Where to find  more information Alzheimer's Association: LimitLaws.hu [employer] on Aging: CashCowGambling.be World Health Organization: https://castaneda-walker.com/ Contact a doctor if: You have any new symptoms. Your symptoms get worse. You have problems with swallowing or choking. Get help right away if: You feel very sad, or feel that you want to harm yourself. Your family members are worried for your  safety. Get help right away if you feel like you may hurt yourself or others, or have thoughts about taking your own life. Go to your nearest emergency room or: Call your local emergency services (911 in the [place] Call the National Suicide Prevention Lifeline at 564-743-8984. This is open 24 hours a day. Text the Crisis Text Line at 213-728-3690. Summary Dementia often affects memory and thinking. Some types of dementia get worse with time and cannot be reversed. Treatment for this condition depends on the cause. Talk with your doctor to figure out what you need help with. Your doctor can help you find support groups and other doctors who can help with your care. This information is not intended to replace advice given to you by your health care provider. Make sure you discuss any questions you have with your healthcare provider. Document Revised: 08/02/2019 Document Reviewed: 08/02/2019 Elsevier Patient Education  2022 [employer].

## 2020-11-29 ENCOUNTER — Other Ambulatory Visit: Payer: Self-pay | Admitting: Family Medicine

## 2020-11-29 DIAGNOSIS — E034 Atrophy of thyroid (acquired): Secondary | ICD-10-CM

## 2020-11-29 LAB — CBC
Hematocrit: 45.5 % (ref 34.0–46.6)
Hemoglobin: 15.7 g/dL (ref 11.1–15.9)
MCH: 32 pg (ref 26.6–33.0)
MCHC: 34.5 g/dL (ref 31.5–35.7)
MCV: 93 fL (ref 79–97)
Platelets: 138 10*3/uL — ABNORMAL LOW (ref 150–450)
RBC: 4.91 x10E6/uL (ref 3.77–5.28)
RDW: 13 % (ref 11.7–15.4)
WBC: 7.4 10*3/uL (ref 3.4–10.8)

## 2020-11-29 LAB — CMP14+EGFR
ALT: 14 IU/L (ref 0–32)
AST: 23 IU/L (ref 0–40)
Albumin/Globulin Ratio: 1.6 (ref 1.2–2.2)
Albumin: 4.3 g/dL (ref 3.5–4.6)
Alkaline Phosphatase: 70 IU/L (ref 44–121)
BUN/Creatinine Ratio: 11 — ABNORMAL LOW (ref 12–28)
BUN: 11 mg/dL (ref 10–36)
Bilirubin Total: 1.3 mg/dL — ABNORMAL HIGH (ref 0.0–1.2)
CO2: 19 mmol/L — ABNORMAL LOW (ref 20–29)
Calcium: 10.3 mg/dL (ref 8.7–10.3)
Chloride: 102 mmol/L (ref 96–106)
Creatinine, Ser: 1 mg/dL (ref 0.57–1.00)
Globulin, Total: 2.7 g/dL (ref 1.5–4.5)
Glucose: 112 mg/dL — ABNORMAL HIGH (ref 65–99)
Potassium: 4.4 mmol/L (ref 3.5–5.2)
Sodium: 140 mmol/L (ref 134–144)
Total Protein: 7 g/dL (ref 6.0–8.5)
eGFR: 52 mL/min/{1.73_m2} — ABNORMAL LOW (ref >59)

## 2020-11-29 LAB — LIPID PANEL
Chol/HDL Ratio: 5 ratio — ABNORMAL HIGH (ref 0.0–4.4)
Cholesterol, Total: 223 mg/dL — ABNORMAL HIGH (ref 100–199)
HDL: 45 mg/dL (ref >39)
LDL Chol Calc (NIH): 154 mg/dL — ABNORMAL HIGH (ref 0–99)
Triglycerides: 134 mg/dL (ref 0–149)
VLDL Cholesterol Cal: 24 mg/dL (ref 5–40)

## 2020-11-29 LAB — VITAMIN B12: Vitamin B-12: 577 pg/mL (ref 232–1245)

## 2020-11-29 LAB — VITAMIN D 25 HYDROXY (VIT D DEFICIENCY, FRACTURES): Vit D, 25-Hydroxy: 128 ng/mL — ABNORMAL HIGH (ref 30.0–100.0)

## 2020-11-29 LAB — RPR: RPR Ser Ql: NONREACTIVE

## 2020-11-29 LAB — T4, FREE: Free T4: 1.44 ng/dL (ref 0.82–1.77)

## 2020-11-29 LAB — TSH: TSH: 7.48 u[IU]/mL — ABNORMAL HIGH (ref 0.450–4.500)

## 2020-11-29 MED ORDER — LEVOTHYROXINE SODIUM 75 MCG PO TABS: 75.0000 ug | ORAL_TABLET | Freq: Every day | ORAL | 0 refills | Status: DC

## 2020-11-30 NOTE — Progress Notes (Signed)
Remote pacemaker transmission.   

## 2020-12-19 ENCOUNTER — Ambulatory Visit: Payer: Medicare HMO

## 2020-12-20 DIAGNOSIS — F0390 Unspecified dementia without behavioral disturbance: Secondary | ICD-10-CM | POA: Insufficient documentation

## 2020-12-20 DIAGNOSIS — F03A0 Unspecified dementia, mild, without behavioral disturbance, psychotic disturbance, mood disturbance, and anxiety: Secondary | ICD-10-CM | POA: Insufficient documentation

## 2020-12-21 ENCOUNTER — Ambulatory Visit: Payer: Medicare HMO

## 2020-12-27 DIAGNOSIS — H00014 Hordeolum externum left upper eyelid: Secondary | ICD-10-CM | POA: Diagnosis not present

## 2021-01-02 ENCOUNTER — Ambulatory Visit (INDEPENDENT_AMBULATORY_CARE_PROVIDER_SITE_OTHER): Payer: Medicare HMO

## 2021-01-02 VITALS — Ht 61.0 in | Wt 108.0 lb

## 2021-01-02 DIAGNOSIS — Z0000 Encounter for general adult medical examination without abnormal findings: Secondary | ICD-10-CM | POA: Diagnosis not present

## 2021-01-02 NOTE — Progress Notes (Signed)
Subjective:   [name] [name] is a 87 y.o. female who presents for an Initial Medicare Annual Wellness Visit.  Virtual Visit via Telephone Note  I connected with  [name] [name] on 01/02/21 at  2:45 PM EDT by telephone and verified that I am speaking with the correct person using two identifiers.  Location: Patient: Home Provider: [facility] Persons participating in the virtual visit: patient/Nurse Health Advisor   I discussed the limitations, risks, security and privacy concerns of performing an evaluation and management service by telephone and the availability of in person appointments. The patient expressed understanding and agreed to proceed.  Interactive audio and video telecommunications were attempted between this nurse and patient, however failed, due to patient having technical difficulties OR patient did not have access to video capability.  We continued and completed visit with audio only.  Some vital signs may be absent or patient reported.   Bladen Umar E Andreika Vandagriff, LPN   Review of Systems     Cardiac Risk Factors include: advanced age (>25men, >46 women);sedentary lifestyle;hypertension;Other (see comment), Risk factor comments: dementia, A.Fib, pacemaker     Objective:    Today's Vitals   01/02/21 1427  Weight: 108 lb (49 kg)  Height: 5\' 1"  (1.549 m)   Body mass index is 20.41 kg/m.  Advanced Directives 01/15/2017  Does Patient Have a Medical Advance Directive? No  Would patient like information on creating a medical advance directive? No - Patient declined    Current Medications (verified) Outpatient Encounter Medications as of 01/02/2021  Medication Sig   apixaban (ELIQUIS) 5 MG TABS tablet Take 1 tablet (5 mg total) by mouth 2 (two) times daily. (Needs to be seen before next refill)   atorvastatin (LIPITOR) 10 MG tablet Take 1 tablet (10 mg total) by mouth every evening.   Cholecalciferol (VITAMIN D3) 1.25 MG (50000 UT) CAPS TAKE 1 CAPSULE (50,000 UNITS TOTAL) BY  MOUTH EVERY 7 (SEVEN) DAYS. X12 WEEKS   diltiazem (CARDIZEM CD) 120 MG 24 hr capsule TAKE 1 CAPSULE BY MOUTH EVERY DAY   dorzolamide-timolol (COSOPT) 22.3-6.8 MG/ML ophthalmic solution Place 1 drop into both eyes 2 (two) times daily.   hydrochlorothiazide (HYDRODIURIL) 12.5 MG tablet TAKE 1 TABLET BY MOUTH EVERY DAY   levothyroxine (SYNTHROID) 75 MCG tablet Take 1 tablet (75 mcg total) by mouth daily before breakfast.   Multiple Vitamins-Minerals (CENTRUM SILVER 50+WOMEN) TABS Take 1 tablet by mouth daily.   Multiple Vitamins-Minerals (MULTIVITAMIN WITH MINERALS) tablet Take 1 tablet by mouth daily.   neomycin-polymyxin b-dexamethasone (MAXITROL) 3.5-10000-0.1 OINT SMARTSIG:Sparingly In Eye(s) 3-4 Times Daily   No facility-administered encounter medications on file as of 01/02/2021.    Allergies (verified) Patient has no known allergies.   History: Past Medical History:  Diagnosis Date   Atrioventricular block, complete (HCC) 2006   Hyperlipidemia    Hypertension    Paroxysmal atrial fibrillation (HCC) 2016   single episode detected on PPM interrogation   Past Surgical History:  Procedure Laterality Date   ABDOMINAL HYSTERECTOMY     EYE SURGERY Bilateral    cataracts   GALLBLADDER SURGERY     Normal coronary angiography  1999   PPM GENERATOR CHANGEOUT N/A 01/15/2017   [employer] Accolade MRI EL DL pacemaker for CHB by Dr 01/17/2017   PULSE GENERATOR IMPLANT     Guidant Ultra dual-mode, dual-pacing, dual-sensing generator for complete atrioventricular block 2006 with some noise on atrial lead and generator recall for very low incidence of loss of output  Family History  Problem Relation Age of Onset   Cancer Sister        breast   Social History   Socioeconomic History   Marital status: Widowed    Spouse name: Not on file   Number of children: 2   Years of education: Not on file   Highest education level: Not on file  Occupational History   Not on file  Tobacco  Use   Smoking status: Never   Smokeless tobacco: Never  Substance and Sexual Activity   Alcohol use: No    Alcohol/week: 0.0 standard drinks   Drug use: No   Sexual activity: Not on file  Other Topics Concern   Not on file  Social History Narrative   Lives alone   Son lives next door, daughter lives in [place]   Social Determinants of Health   Financial Resource Strain: Not on file  Food Insecurity: Not on file  Transportation Needs: Not on file  Physical Activity: Insufficiently Active   Days of Exercise per Week: 7 days   Minutes of Exercise per Session: 20 min  Stress: Not on file  Social Connections: Not on file    Tobacco Counseling Counseling given: Not Answered   Clinical Intake:  Pre-visit preparation completed: Yes  Pain : No/denies pain     BMI - recorded: 20.41 Nutritional Status: BMI of 19-24  Normal Nutritional Risks: None Diabetes: No  How often do you need to have someone help you when you read instructions, pamphlets, or other written materials from your doctor or pharmacy?: 1 - Never  Diabetic? no  Interpreter Needed?: No  Information entered by :: Marialuiza Car, LPN   Activities of Daily Living In your present state of health, do you have any difficulty performing the following activities: 01/02/2021  Hearing? Y  Comment mild - declines hearing aids  Vision? N  Difficulty concentrating or making decisions? Y  Walking or climbing stairs? N  Dressing or bathing? N  Doing errands, shopping? N  Preparing Food and eating ? N  Using the Toilet? N  In the past six months, have you accidently leaked urine? N  Do you have problems with loss of bowel control? N  Managing your Medications? N  Managing your Finances? N  Housekeeping or managing your Housekeeping? N  Some recent data might be hidden    Patient Care Team: Raliegh Ip, DO as PCP - General (Family Medicine) Hillis Range, MD as PCP - Electrophysiology  (Cardiology) Delora Fuel, [place] (Optometry) Jenene Slicker, Deberah Castle, MD as Consulting Physician (Ophthalmology) Adam Phenix, DPM as Consulting Physician (Podiatry)  Indicate any recent Medical Services you may have received from other than [facility] providers in the past year (date may be approximate).     Assessment:   This is a routine wellness examination for [name] [name].  Hearing/Vision screen Hearing Screening - Comments:: Denies hearing difficulties  Vision Screening - Comments:: Wears rx glasses prn - up to date with annual eye exams at [facility] in [place]  Dietary issues and exercise activities discussed: Current Exercise Habits: Home exercise routine, Type of exercise: walking, Time (Minutes): 20, Frequency (Times/Week): 7, Weekly Exercise (Minutes/Week): 140, Intensity: Mild, Exercise limited by: psychological condition(s);cardiac condition(s)   Goals Addressed   None    Depression Screen PHQ 2/9 Scores 11/28/2020 07/07/2019 01/13/2018 12/23/2017 12/16/2017 11/03/2017 10/10/2017  PHQ - 2 Score 0 0 0 0 0 0 0    Fall Risk Fall Risk  11/28/2020 07/07/2019 01/13/2018 12/23/2017 12/16/2017  Falls in the past year? 0 0 No No No  Number falls in past yr: - 0 - - -  Injury with Fall? - 0 - - -  Risk for fall due to : - No Fall Risks - - -  Follow up - Falls evaluation completed - - -    FALL RISK PREVENTION PERTAINING TO THE HOME:  Any stairs in or around the home? No  If so, are there any without handrails? No  Home free of loose throw rugs in walkways, pet beds, electrical cords, etc? Yes  Adequate lighting in your home to reduce risk of falls? Yes   ASSISTIVE DEVICES UTILIZED TO PREVENT FALLS:  Life alert? Yes  Use of a cane, walker or w/c? Yes  Grab bars in the bathroom? No  Shower chair or bench in shower? No  Elevated toilet seat or a handicapped toilet? No   TIMED UP AND GO:  Was the test performed? No . Telephonic visit.  Cognitive Function: Cognitive status assessed by direct  observation. Patient has current diagnosis of cognitive impairment. Patient is currently following up with Dr Nadine Counts for this - just had MMSE 2 months ago   MMSE - Mini Mental State Exam 11/28/2020  Orientation to time 2  Orientation to Place 5  Registration 1  Attention/ Calculation 3  Recall 1  Language- name 2 objects 2  Language- repeat 1  Language- follow 3 step command 3  Language- read & follow direction 1  Write a sentence 1  Copy design 1  Total score 21        Immunizations Immunization History  Administered Date(s) Administered   Influenza, High Dose Seasonal PF 01/06/2017, 01/13/2018   Influenza,inj,Quad PF,6+ Mos 01/31/2016   Moderna Sars-Covid-2 Vaccination 01/25/2020   Pneumococcal Conjugate-13 01/05/2014    TDAP status: Due, Education has been provided regarding the importance of this vaccine. Advised may receive this vaccine at local pharmacy or Health Dept. Aware to provide a copy of the vaccination record if obtained from local pharmacy or Health Dept. Verbalized acceptance and understanding.  Flu Vaccine status: Up to date  Pneumococcal vaccine status: Up to date  Covid-19 vaccine status: Completed vaccines  Qualifies for Shingles Vaccine? Yes   Zostavax completed Yes   Shingrix Completed?: No.    Education has been provided regarding the importance of this vaccine. Patient has been advised to call insurance company to determine out of pocket expense if they have not yet received this vaccine. Advised may also receive vaccine at local pharmacy or Health Dept. Verbalized acceptance and understanding.  Screening Tests Health Maintenance  Topic Date Due   COVID-19 Vaccine (2 - Moderna series) 02/22/2020   INFLUENZA VACCINE  10/30/2020   Zoster Vaccines- Shingrix (1 of 2) 02/28/2021 (Originally 05/28/1974)   TETANUS/TDAP  11/28/2021 (Originally 05/28/1943)   HPV VACCINES  Aged Out    Health Maintenance  Health Maintenance Due  Topic Date Due    COVID-19 Vaccine (2 - Moderna series) 02/22/2020   INFLUENZA VACCINE  10/30/2020    Colorectal cancer screening: No longer required.   Mammogram status: No longer required due to age.  Bone Density - no longer required  Lung Cancer Screening: (Low Dose CT Chest recommended if Age 57-80 years, 30 pack-year currently smoking OR have quit w/in 15years.) does not qualify  Additional Screening:  Hepatitis C Screening: does not qualify  Vision Screening: Recommended annual ophthalmology exams for early detection of glaucoma and other disorders of the eye.  Is the patient up to date with their annual eye exam?  Yes  Who is the provider or what is the name of the office in which the patient attends annual eye exams? [facility] If pt is not established with a provider, would they like to be referred to a provider to establish care? No .   Dental Screening: Recommended annual dental exams for proper oral hygiene  Community Resource Referral / Chronic Care Management: CRR required this visit?  No   CCM required this visit?  No      Plan:     I have personally reviewed and noted the following in the patient's chart:   Medical and social history Use of alcohol, tobacco or illicit drugs  Current medications and supplements including opioid prescriptions. Patient is not currently taking opioid prescriptions. Functional ability and status Nutritional status Physical activity Advanced directives List of other physicians Hospitalizations, surgeries, and ER visits in previous 12 months Vitals Screenings to include cognitive, depression, and falls Referrals and appointments  In addition, I have reviewed and discussed with patient certain preventive protocols, quality metrics, and best practice recommendations. A written personalized care plan for preventive services as well as general preventive health recommendations were provided to patient.     [place] Constable,  LPN   [id number]   Nurse Notes: Lives alone, current dx of dementia, still driving and independent - has life alert and son lives next door

## 2021-01-02 NOTE — Patient Instructions (Signed)
[name] [name] , Thank you for taking time to come for your Medicare Wellness Visit. I appreciate your ongoing commitment to your health goals. Please review the following plan we discussed and let me know if I can assist you in the future.   Screening recommendations/referrals: Colonoscopy: No longer required Mammogram: No longer required Bone Density: No longer required Recommended yearly ophthalmology/optometry visit for glaucoma screening and checkup Recommended yearly dental visit for hygiene and checkup  Vaccinations: Influenza vaccine: Done 2022 - please bring [place] records to be added to your chart Pneumococcal vaccine: Done 01/05/2014 - other vaccine done - please bring [place] records Tdap vaccine: Due. Every 10 years Shingles vaccine: Due. Shingrix discussed. Please contact your pharmacy for coverage information.     Covid-19: Done - please bring [place] records to be added to your chart  Advanced directives: Advance directive discussed with you today. Even though you declined this today, please call our office should you change your mind, and we can give you the proper paperwork for you to fill out.   Conditions/risks identified: Aim for 30 minutes of exercise or brisk walking each day, drink 6-8 glasses of water and eat lots of fruits and vegetables.   Next appointment: Follow up in one year for your annual wellness visit    Preventive Care 85 Years and Older, Female Preventive care refers to lifestyle choices and visits with your health care provider that can promote health and wellness. What does preventive care include? A yearly physical exam. This is also called an annual well check. Dental exams once or twice a year. Routine eye exams. Ask your health care provider how often you should have your eyes checked. Personal lifestyle choices, including: Daily care of your teeth and gums. Regular physical activity. Eating a healthy diet. Avoiding tobacco and drug use. Limiting alcohol  use. Practicing safe sex. Taking low-dose aspirin every day. Taking vitamin and mineral supplements as recommended by your health care provider. What happens during an annual well check? The services and screenings done by your health care provider during your annual well check will depend on your age, overall health, lifestyle risk factors, and family history of disease. Counseling  Your health care provider may ask you questions about your: Alcohol use. Tobacco use. Drug use. Emotional well-being. Home and relationship well-being. Sexual activity. Eating habits. History of falls. Memory and ability to understand (cognition). Work and work Astronomer. Reproductive health. Screening  You may have the following tests or measurements: Height, weight, and BMI. Blood pressure. Lipid and cholesterol levels. These may be checked every 5 years, or more frequently if you are over 30 years old. Skin check. Lung cancer screening. You may have this screening every year starting at age 85 if you have a 30-pack-year history of smoking and currently smoke or have quit within the past 15 years. Fecal occult blood test (FOBT) of the stool. You may have this test every year starting at age 85. Flexible sigmoidoscopy or colonoscopy. You may have a sigmoidoscopy every 5 years or a colonoscopy every 10 years starting at age 85. Hepatitis C blood test. Hepatitis B blood test. Sexually transmitted disease (STD) testing. Diabetes screening. This is done by checking your blood sugar (glucose) after you have not eaten for a while (fasting). You may have this done every 1-3 years. Bone density scan. This is done to screen for osteoporosis. You may have this done starting at age 85. Mammogram. This may be done every 1-2 years. Talk to your  health care provider about how often you should have regular mammograms. Talk with your health care provider about your test results, treatment options, and if necessary,  the need for more tests. Vaccines  Your health care provider may recommend certain vaccines, such as: Influenza vaccine. This is recommended every year. Tetanus, diphtheria, and acellular pertussis (Tdap, Td) vaccine. You may need a Td booster every 10 years. Zoster vaccine. You may need this after age 85. Pneumococcal 13-valent conjugate (PCV13) vaccine. One dose is recommended after age 85. Pneumococcal polysaccharide (PPSV23) vaccine. One dose is recommended after age 85. Talk to your health care provider about which screenings and vaccines you need and how often you need them. This information is not intended to replace advice given to you by your health care provider. Make sure you discuss any questions you have with your health care provider. Document Released: 04/14/2015 Document Revised: 12/06/2015 Document Reviewed: 01/17/2015 Elsevier Interactive Patient Education  2017 [employer] Prevention in the Home Falls can cause injuries. They can happen to people of all ages. There are many things you can do to make your home safe and to help prevent falls. What can I do on the outside of my home? Regularly fix the edges of walkways and driveways and fix any cracks. Remove anything that might make you trip as you walk through a door, such as a raised step or threshold. Trim any bushes or trees on the path to your home. Use bright outdoor lighting. Clear any walking paths of anything that might make someone trip, such as rocks or tools. Regularly check to see if handrails are loose or broken. Make sure that both sides of any steps have handrails. Any raised decks and porches should have guardrails on the edges. Have any leaves, snow, or ice cleared regularly. Use sand or salt on walking paths during winter. Clean up any spills in your garage right away. This includes oil or grease spills. What can I do in the bathroom? Use night lights. Install grab bars by the toilet and in the  tub and shower. Do not use towel bars as grab bars. Use non-skid mats or decals in the tub or shower. If you need to sit down in the shower, use a plastic, non-slip stool. Keep the floor dry. Clean up any water that spills on the floor as soon as it happens. Remove soap buildup in the tub or shower regularly. Attach bath mats securely with double-sided non-slip rug tape. Do not have throw rugs and other things on the floor that can make you trip. What can I do in the bedroom? Use night lights. Make sure that you have a light by your bed that is easy to reach. Do not use any sheets or blankets that are too big for your bed. They should not hang down onto the floor. Have a firm chair that has side arms. You can use this for support while you get dressed. Do not have throw rugs and other things on the floor that can make you trip. What can I do in the kitchen? Clean up any spills right away. Avoid walking on wet floors. Keep items that you use a lot in easy-to-reach places. If you need to reach something above you, use a strong step stool that has a grab bar. Keep electrical cords out of the way. Do not use floor polish or wax that makes floors slippery. If you must use wax, use non-skid floor wax. Do not have  throw rugs and other things on the floor that can make you trip. What can I do with my stairs? Do not leave any items on the stairs. Make sure that there are handrails on both sides of the stairs and use them. Fix handrails that are broken or loose. Make sure that handrails are as long as the stairways. Check any carpeting to make sure that it is firmly attached to the stairs. Fix any carpet that is loose or worn. Avoid having throw rugs at the top or bottom of the stairs. If you do have throw rugs, attach them to the floor with carpet tape. Make sure that you have a light switch at the top of the stairs and the bottom of the stairs. If you do not have them, ask someone to add them for  you. What else can I do to help prevent falls? Wear shoes that: Do not have high heels. Have rubber bottoms. Are comfortable and fit you well. Are closed at the toe. Do not wear sandals. If you use a stepladder: Make sure that it is fully opened. Do not climb a closed stepladder. Make sure that both sides of the stepladder are locked into place. Ask someone to hold it for you, if possible. Clearly mark and make sure that you can see: Any grab bars or handrails. First and last steps. Where the edge of each step is. Use tools that help you move around (mobility aids) if they are needed. These include: Canes. Walkers. Scooters. Crutches. Turn on the lights when you go into a dark area. Replace any light bulbs as soon as they burn out. Set up your furniture so you have a clear path. Avoid moving your furniture around. If any of your floors are uneven, fix them. If there are any pets around you, be aware of where they are. Review your medicines with your doctor. Some medicines can make you feel dizzy. This can increase your chance of falling. Ask your doctor what other things that you can do to help prevent falls. This information is not intended to replace advice given to you by your health care provider. Make sure you discuss any questions you have with your health care provider. Document Released: 01/12/2009 Document Revised: 08/24/2015 Document Reviewed: 04/22/2014 Elsevier Interactive Patient Education  2017 [employer].

## 2021-01-10 ENCOUNTER — Other Ambulatory Visit: Payer: Self-pay

## 2021-01-10 ENCOUNTER — Encounter: Payer: Self-pay | Admitting: Family Medicine

## 2021-01-10 ENCOUNTER — Ambulatory Visit (INDEPENDENT_AMBULATORY_CARE_PROVIDER_SITE_OTHER): Payer: Medicare HMO | Admitting: Family Medicine

## 2021-01-10 VITALS — BP 152/82 | HR 58 | Temp 97.3°F | Resp 20 | Ht 61.0 in | Wt 102.0 lb

## 2021-01-10 DIAGNOSIS — R634 Abnormal weight loss: Secondary | ICD-10-CM

## 2021-01-10 DIAGNOSIS — R413 Other amnesia: Secondary | ICD-10-CM

## 2021-01-10 DIAGNOSIS — E034 Atrophy of thyroid (acquired): Secondary | ICD-10-CM | POA: Diagnosis not present

## 2021-01-10 DIAGNOSIS — Z23 Encounter for immunization: Secondary | ICD-10-CM | POA: Diagnosis not present

## 2021-01-10 MED ORDER — MEMANTINE HCL 5 MG PO TABS: ORAL_TABLET | ORAL | 0 refills | Status: DC

## 2021-01-10 NOTE — Patient Instructions (Signed)
Nameda ordered.  Start with 1 tablet daily. After 1 month, may go up to 1 tablet TWICE daily. Encourage 3 meals per day. High-Protein and High-Calorie Diet Eating high-protein and high-calorie foods can help you to gain weight, heal after an injury, and recover after an illness or surgery. The specific amount of daily protein and calories you need depends on: Your body weight. The reason this diet is recommended for you. Generally, a high-protein, high-calorie diet involves: Eating 250-500 extra calories each day. Making sure that you get enough of your daily calories from protein. Ask your health care provider how many of your calories should come from protein. Talk with a health care provider or a dietitian about how much protein and how many calories you need each day. Follow the diet as directed by your health care provider. What are tips for following this plan? Reading food labels Check the nutrition facts label for calories, grams of fat and protein. Items with more than 4 grams of protein are high-protein foods. Preparing meals Add whole milk, half-and-half, or heavy cream to cereal, pudding, soup, or hot cocoa. Add whole milk to instant breakfast drinks. Add peanut butter to oatmeal or smoothies. Add powdered milk to baked goods, smoothies, or milkshakes. Add powdered milk, cream, or butter to mashed potatoes. Add cheese to cooked vegetables. Make whole-milk yogurt parfaits. Top them with granola, fruit, or nuts. Add cottage cheese to fruit. Add avocado, cheese, or both to sandwiches or salads. Add avocado to smoothies. Add meat, poultry, or seafood to rice, pasta, casseroles, salads, and soups. Use mayonnaise when making egg salad, chicken salad, or tuna salad. Use peanut butter as a dip for fruits and vegetables or as a topping for pretzels, celery, or crackers. Add beans to casseroles, dips, and spreads. Add pureed beans to sauces and soups. Replace calorie-free drinks with  calorie-containing drinks, such as milk and fruit juice. Replace water with milk or heavy cream when making foods such as oatmeal, pudding, or cocoa. Add oil or butter to cooked vegetables and grains. Add cream cheese to sandwiches or as a topping on crackers and bread. Make cream-based pastas and soups. General information Ask your health care provider if you should take a nutritional supplement. Try to eat six small meals each day instead of three large meals. A general goal is to eat every 2 to 3 hours. Eat a balanced diet. In each meal, include one food that is high in protein and one food with fat in it. Keep nutritious snacks available, such as nuts, trail mixes, dried fruit, and yogurt. If you have kidney disease or diabetes, talk with your health care provider about how much protein is safe for you. Too much protein may put extra stress on your kidneys. Drink your calories. Choose high-calorie drinks and have them after your meals. Consider setting a timer to remind you to eat. You will want to eat even if you do not feel very hungry. What high-protein foods should I eat? Vegetables Soybeans. Peas. Grains Quinoa. Bulgur wheat. Buckwheat. Meats and other proteins Beef, pork, and poultry. Fish and seafood. Eggs. Tofu. Textured vegetable protein (TVP). Peanut butter. Nuts and seeds. Dried beans. Protein powders. Hummus. Dairy Whole milk. Whole-milk yogurt. Powdered milk. Cheese. [employer]. Eggnog. Beverages High-protein supplement drinks. Soy milk. Other foods Protein bars. The items listed above may not be a complete list of foods and beverages you can eat and drink. Contact a dietitian for more information. What high-calorie foods should I eat?  Fruits Dried fruit. Fruit leather. Canned fruit in syrup. Fruit juice. Avocado. Vegetables Vegetables cooked in oil or butter. Fried potatoes. Grains Pasta. Quick breads. Muffins. Pancakes. Ready-to-eat cereal. Meats and other  proteins Peanut butter. Nuts and seeds. Dairy Heavy cream. Whipped cream. Cream cheese. Sour cream. Ice cream. Custard. Pudding. Whole milk dairy products. Beverages Meal-replacement beverages. Nutrition shakes. Fruit juice. Seasonings and condiments Salad dressing. Mayonnaise. Alfredo sauce. Fruit preserves or jelly. Honey. Syrup. Sweets and desserts Cake. Cookies. Pie. Pastries. Candy bars. Chocolate. Fats and oils Butter or margarine. Oil. Gravy. Other foods Meal-replacement bars. The items listed above may not be a complete list of foods and beverages you can eat and drink. Contact a dietitian for more information. Summary A high-protein, high-calorie diet can help you gain weight or heal faster after an injury, illness, or surgery. To increase your protein and calories, add ingredients such as whole milk, peanut butter, cheese, beans, meat, or seafood to meal items. To get enough extra calories each day, include high-calorie foods and beverages at each meal. Adding a high-calorie drink or shake can be an easy way to help you get enough calories each day. Talk with your healthcare provider or dietitian about the best options for you. This information is not intended to replace advice given to you by your health care provider. Make sure you discuss any questions you have with your health care provider. Document Revised: 02/20/2020 Document Reviewed: 02/20/2020 Elsevier Patient Education  2022 [employer].

## 2021-01-10 NOTE — Progress Notes (Signed)
Subjective: CC: Weight loss, memory changes PCP: Raliegh Ip, DO YBO:[id number] L [name] [name] is a 87 y.o. female presenting to clinic today for:  1.  Weight loss, memory change Patient was seen about a month and a half ago for weight loss and memory changes.  Her MMSE score showed some mild dementia.  We check metabolic labs and it was discovered that her TSH was slightly high with normal T4.  She was advised to go to 1 tablet daily on her Synthroid and she is here for recheck.  Appetite has been unchanged.  She admits that she just snacks most the time and does not eat any specific meals unless they are brought to her.  She does eat cereal quite frequently and occasionally indulges in ice cream at nighttime.  Her family member does not believe that her memory has been any worse than previous.  It seems to continue waxing and waning.  Sometimes she just does not make sense but sometimes she is clear.  She denies any GI bleeding, vaginal bleeding or bleeding in her urine.  No abdominal pain reported.  She remains fairly independent and often goes outside on her own to dabble.  No observed visual or auditory hallucinations  ROS: Per HPI  No Known Allergies Past Medical History:  Diagnosis Date   Atrioventricular block, complete (HCC) 2006   Hyperlipidemia    Hypertension    Paroxysmal atrial fibrillation (HCC) 2016   single episode detected on PPM interrogation    Current Outpatient Medications:    apixaban (ELIQUIS) 5 MG TABS tablet, Take 1 tablet (5 mg total) by mouth 2 (two) times daily. (Needs to be seen before next refill), Disp: 60 tablet, Rfl: 1   atorvastatin (LIPITOR) 10 MG tablet, Take 1 tablet (10 mg total) by mouth every evening., Disp: 30 tablet, Rfl: 0   Cholecalciferol (VITAMIN D3) 1.25 MG (50000 UT) CAPS, TAKE 1 CAPSULE (50,000 UNITS TOTAL) BY MOUTH EVERY 7 (SEVEN) DAYS. X12 WEEKS, Disp: 12 capsule, Rfl: 0   diltiazem (CARDIZEM CD) 120 MG 24 hr capsule, TAKE 1 CAPSULE BY  MOUTH EVERY DAY, Disp: 90 capsule, Rfl: 3   dorzolamide-timolol (COSOPT) 22.3-6.8 MG/ML ophthalmic solution, Place 1 drop into both eyes 2 (two) times daily., Disp: , Rfl:    hydrochlorothiazide (HYDRODIURIL) 12.5 MG tablet, TAKE 1 TABLET BY MOUTH EVERY DAY, Disp: 90 tablet, Rfl: 3   levothyroxine (SYNTHROID) 75 MCG tablet, Take 1 tablet (75 mcg total) by mouth daily before breakfast., Disp: 90 tablet, Rfl: 0   Multiple Vitamins-Minerals (CENTRUM SILVER 50+WOMEN) TABS, Take 1 tablet by mouth daily., Disp: , Rfl:    Multiple Vitamins-Minerals (MULTIVITAMIN WITH MINERALS) tablet, Take 1 tablet by mouth daily., Disp: , Rfl:    neomycin-polymyxin b-dexamethasone (MAXITROL) 3.5-10000-0.1 OINT, SMARTSIG:Sparingly In Eye(s) 3-4 Times Daily, Disp: , Rfl:  Social History   Socioeconomic History   Marital status: Widowed    Spouse name: Not on file   Number of children: 2   Years of education: Not on file   Highest education level: Not on file  Occupational History   Not on file  Tobacco Use   Smoking status: Never   Smokeless tobacco: Never  Substance and Sexual Activity   Alcohol use: No    Alcohol/week: 0.0 standard drinks   Drug use: No   Sexual activity: Not on file  Other Topics Concern   Not on file  Social History Narrative   Lives alone   Son lives next door,  daughter lives in [place]   Social Determinants of Health   Financial Resource Strain: Low Risk    Difficulty of Paying Living Expenses: Not hard at all  Food Insecurity: No Food Insecurity   Worried About Programme researcher, broadcasting/film/video in the Last Year: Never true   Barista in the Last Year: Never true  Transportation Needs: No Transportation Needs   Lack of Transportation (Medical): No   Lack of Transportation (Non-Medical): No  Physical Activity: Insufficiently Active   Days of Exercise per Week: 7 days   Minutes of Exercise per Session: 20 min  Stress: No Stress Concern Present   Feeling of Stress : Only a little   Social Connections: Moderately Integrated   Frequency of Communication with Friends and Family: More than three times a week   Frequency of Social Gatherings with Friends and Family: More than three times a week   Attends Religious Services: More than 4 times per year   Active Member of [employer] or Organizations: Yes   Attends Banker Meetings: More than 4 times per year   Marital Status: Widowed  Catering manager Violence: Not At Risk   Fear of Current or Ex-Partner: No   Emotionally Abused: No   Physically Abused: No   Sexually Abused: No   Family History  Problem Relation Age of Onset   Cancer Sister        breast    Objective: Office vital signs reviewed. BP (!) 152/82   Pulse (!) 58   Temp (!) 97.3 F (36.3 C) (Temporal)   Resp 20   Ht 5\' 1"  (1.549 m)   Wt 102 lb (46.3 kg)   SpO2 96%   BMI 19.27 kg/m   Physical Examination:  General: Awake, alert, thin elderly female, No acute distress Cardio: Occasional skipped beat but otherwise regular with bradycardia, S1S2 heard, no murmurs appreciated Pulm: clear to auscultation bilaterally, no wheezes, rhonchi or rales; normal work of breathing on room air MSK: Ambulating independently Neuro:  hard of hearing  MMSE - Mini Mental State Exam 01/10/2021 11/28/2020  Orientation to time 2 2  Orientation to Place 5 5  Registration 3 1  Attention/ Calculation 1 3  Recall 1 1  Language- name 2 objects 2 2  Language- repeat 1 1  Language- follow 3 step command 3 3  Language- read & follow direction 1 1  Write a sentence 1 1  Copy design 0 1  Total score 20 21    Assessment/ Plan: 87 y.o. female   Memory change - Plan: TSH, T4, Free, memantine (NAMENDA) 5 MG tablet  Hypothyroidism due to acquired atrophy of thyroid - Plan: TSH, T4, Free  Unexplained weight loss - Plan: Basic Metabolic Panel  Need for immunization against influenza - Plan: Flu Vaccine QUAD High Dose(Fluad)  Essentially unchanged MMSE score.   Still consistent with a mild dementia.  We discussed starting Namenda.  We will start with a very low-dose of 5 mg daily and then advance to 5 mg twice daily if tolerated.  Recheck TSH, free T4 given recent advancement of her thyroid dose  She is 5 pounds down from her previous checkup.  It does not sound like they have made much headway in advancing her diet as we discussed previously but again reiterated the importance of incorporating 3 meals per day and trying to bulk up the breakfast meal that she does eat regularly with nuts, whole milk and fruits.  We also  discussed consideration for addition of Ensure or boost.  Would like to recheck her again in the next 6 to 8 weeks  Influenza vaccination administered  No orders of the defined types were placed in this encounter.  No orders of the defined types were placed in this encounter.    Raliegh Ip, DO [facility] [phone]

## 2021-01-11 LAB — BASIC METABOLIC PANEL
BUN/Creatinine Ratio: 14 (ref 12–28)
BUN: 11 mg/dL (ref 10–36)
CO2: 20 mmol/L (ref 20–29)
Calcium: 10.6 mg/dL — ABNORMAL HIGH (ref 8.7–10.3)
Chloride: 100 mmol/L (ref 96–106)
Creatinine, Ser: 0.81 mg/dL (ref 0.57–1.00)
Glucose: 98 mg/dL (ref 70–99)
Potassium: 4.5 mmol/L (ref 3.5–5.2)
Sodium: 140 mmol/L (ref 134–144)
eGFR: 67 mL/min/{1.73_m2} (ref >59)

## 2021-01-11 LAB — TSH: TSH: 7.3 u[IU]/mL — ABNORMAL HIGH (ref 0.450–4.500)

## 2021-01-11 LAB — T4, FREE: Free T4: 1.72 ng/dL (ref 0.82–1.77)

## 2021-01-12 DIAGNOSIS — H353221 Exudative age-related macular degeneration, left eye, with active choroidal neovascularization: Secondary | ICD-10-CM | POA: Diagnosis not present

## 2021-01-24 ENCOUNTER — Encounter: Payer: Self-pay | Admitting: Family Medicine

## 2021-01-24 ENCOUNTER — Ambulatory Visit (INDEPENDENT_AMBULATORY_CARE_PROVIDER_SITE_OTHER): Payer: Medicare HMO | Admitting: Family Medicine

## 2021-01-24 VITALS — BP 138/80 | HR 59 | Temp 96.8°F | Ht 61.0 in | Wt 102.0 lb

## 2021-01-24 DIAGNOSIS — T63441A Toxic effect of venom of bees, accidental (unintentional), initial encounter: Secondary | ICD-10-CM | POA: Diagnosis not present

## 2021-01-24 MED ORDER — PREDNISONE 20 MG PO TABS: 20.0000 mg | ORAL_TABLET | Freq: Every day | ORAL | 0 refills | Status: AC

## 2021-01-24 NOTE — Progress Notes (Signed)
Acute Office Visit  Subjective:    Patient ID: [name] [name], female    DOB: January 02, 1925, 87 y.o.   MRN: [id number]  Chief Complaint  Patient presents with   Insect Bite    HPI Here with her daughter today. Patient is in today for a bee sting that occurred 4 days ago on her left foot. She saw 2 yellow jackets on her foot and felt the stings occur. She denies pain in her foot. She does have swelling and a blister. She denies drainage, warmth, or fever. She has taken benadryl off and on. She has tried elevating her foot.   Past Medical History:  Diagnosis Date   Atrioventricular block, complete (HCC) 2006   Hyperlipidemia    Hypertension    Paroxysmal atrial fibrillation (HCC) 2016   single episode detected on PPM interrogation    Past Surgical History:  Procedure Laterality Date   ABDOMINAL HYSTERECTOMY     EYE SURGERY Bilateral    cataracts   GALLBLADDER SURGERY     Normal coronary angiography  1999   PPM GENERATOR CHANGEOUT N/A 01/15/2017   [employer] Accolade MRI EL DL pacemaker for CHB by Dr Johney Frame   PULSE GENERATOR IMPLANT     Guidant Ultra dual-mode, dual-pacing, dual-sensing generator for complete atrioventricular block 2006 with some noise on atrial lead and generator recall for very low incidence of loss of output    Family History  Problem Relation Age of Onset   Cancer Sister        breast    Social History   Socioeconomic History   Marital status: Widowed    Spouse name: Not on file   Number of children: 2   Years of education: Not on file   Highest education level: Not on file  Occupational History   Not on file  Tobacco Use   Smoking status: Never   Smokeless tobacco: Never  Substance and Sexual Activity   Alcohol use: No    Alcohol/week: 0.0 standard drinks   Drug use: No   Sexual activity: Not on file  Other Topics Concern   Not on file  Social History Narrative   Lives alone   Son lives next door, daughter lives in [place]    Social Determinants of Health   Financial Resource Strain: Low Risk    Difficulty of Paying Living Expenses: Not hard at all  Food Insecurity: No Food Insecurity   Worried About Programme researcher, broadcasting/film/video in the Last Year: Never true   Barista in the Last Year: Never true  Transportation Needs: No Transportation Needs   Lack of Transportation (Medical): No   Lack of Transportation (Non-Medical): No  Physical Activity: Insufficiently Active   Days of Exercise per Week: 7 days   Minutes of Exercise per Session: 20 min  Stress: No Stress Concern Present   Feeling of Stress : Only a little  Social Connections: Moderately Integrated   Frequency of Communication with Friends and Family: More than three times a week   Frequency of Social Gatherings with Friends and Family: More than three times a week   Attends Religious Services: More than 4 times per year   Active Member of [employer] or Organizations: Yes   Attends Banker Meetings: More than 4 times per year   Marital Status: Widowed  Intimate Partner Violence: Not At Risk   Fear of Current or Ex-Partner: No   Emotionally Abused: No   Physically Abused:  No   Sexually Abused: No    Outpatient Medications Prior to Visit  Medication Sig Dispense Refill   apixaban (ELIQUIS) 5 MG TABS tablet Take 1 tablet (5 mg total) by mouth 2 (two) times daily. (Needs to be seen before next refill) 60 tablet 1   atorvastatin (LIPITOR) 10 MG tablet Take 1 tablet (10 mg total) by mouth every evening. 30 tablet 0   Cholecalciferol (VITAMIN D3) 1.25 MG (50000 UT) CAPS TAKE 1 CAPSULE (50,000 UNITS TOTAL) BY MOUTH EVERY 7 (SEVEN) DAYS. X12 WEEKS 12 capsule 0   diltiazem (CARDIZEM CD) 120 MG 24 hr capsule TAKE 1 CAPSULE BY MOUTH EVERY DAY 90 capsule 3   dorzolamide-timolol (COSOPT) 22.3-6.8 MG/ML ophthalmic solution Place 1 drop into both eyes 2 (two) times daily.     hydrochlorothiazide (HYDRODIURIL) 12.5 MG tablet TAKE 1 TABLET BY MOUTH EVERY  DAY 90 tablet 3   levothyroxine (SYNTHROID) 75 MCG tablet Take 1 tablet (75 mcg total) by mouth daily before breakfast. 90 tablet 0   memantine (NAMENDA) 5 MG tablet Take 1 tablet (5 mg total) by mouth daily for 30 days, THEN 1 tablet (5 mg total) 2 (two) times daily. 90 tablet 0   Multiple Vitamins-Minerals (CENTRUM SILVER 50+WOMEN) TABS Take 1 tablet by mouth daily.     Multiple Vitamins-Minerals (MULTIVITAMIN WITH MINERALS) tablet Take 1 tablet by mouth daily.     neomycin-polymyxin b-dexamethasone (MAXITROL) 3.5-10000-0.1 OINT SMARTSIG:Sparingly In Eye(s) 3-4 Times Daily     No facility-administered medications prior to visit.    No Known Allergies  Review of Systems As per HPI.     Objective:    Physical Exam Vitals and nursing note reviewed.  Constitutional:      General: She is not in acute distress.    Appearance: She is not ill-appearing, toxic-appearing or diaphoretic.  HENT:     Head: Atraumatic.     Mouth/Throat:     Mouth: No angioedema.  Pulmonary:     Effort: Pulmonary effort is normal. No respiratory distress.  Feet:     Left foot:     Skin integrity: Blister (dorsal aspect, no signs of infection) present. No erythema or warmth.     Comments: Generalized swelling to left foot. Brisk capillary refill. Blister present without signs of infection. Sensation intact. Full ROM.  Neurological:     Mental Status: She is alert and oriented to person, place, and time.  Psychiatric:        Mood and Affect: Mood normal.        Behavior: Behavior normal.    BP 138/80   Pulse (!) 59   Temp (!) 96.8 F (36 C)   Ht $R'5\' 1"'GZ$  (1.549 m)   Wt 102 lb (46.3 kg)   SpO2 97%   BMI 19.27 kg/m  Wt Readings from Last 3 Encounters:  01/24/21 102 lb (46.3 kg)  01/10/21 102 lb (46.3 kg)  01/02/21 108 lb (49 kg)    Health Maintenance Due  Topic Date Due   Pneumonia Vaccine 88+ Years old (2 - PPSV23 if available, else PCV20) 01/06/2015    There are no preventive care reminders  to display for this patient.   Lab Results  Component Value Date   TSH 7.300 (H) 01/10/2021   Lab Results  Component Value Date   WBC 7.4 11/28/2020   HGB 15.7 11/28/2020   HCT 45.5 11/28/2020   MCV 93 11/28/2020   PLT 138 (L) 11/28/2020   Lab Results  Component  Value Date   NA 140 01/10/2021   K 4.5 01/10/2021   CO2 20 01/10/2021   GLUCOSE 98 01/10/2021   BUN 11 01/10/2021   CREATININE 0.81 01/10/2021   BILITOT 1.3 (H) 11/28/2020   ALKPHOS 70 11/28/2020   AST 23 11/28/2020   ALT 14 11/28/2020   PROT 7.0 11/28/2020   ALBUMIN 4.3 11/28/2020   CALCIUM 10.6 (H) 01/10/2021   EGFR 67 01/10/2021   Lab Results  Component Value Date   CHOL 223 (H) 11/28/2020   Lab Results  Component Value Date   HDL 45 11/28/2020   Lab Results  Component Value Date   LDLCALC 154 (H) 11/28/2020   Lab Results  Component Value Date   TRIG 134 11/28/2020   Lab Results  Component Value Date   CHOLHDL 5.0 (H) 11/28/2020   Lab Results  Component Value Date   HGBA1C 5.6 11/28/2020       Assessment & Plan:   Tomasita was seen today for insect bite.  Diagnoses and all orders for this visit:  Bee sting reaction, accidental or unintentional, initial encounter Discussed care of blister. Prednisone burst as below. Discussed OTC benadry or zyrtec 5 mg. Elevate foot. Discussed return precautions and signs of infection.  -     predniSONE (DELTASONE) 20 MG tablet; Take 1 tablet (20 mg total) by mouth daily with breakfast for 5 days.  Return to office for new or worsening symptoms, or if symptoms persist.   The patient indicates understanding of these issues and agrees with the plan.  Gwenlyn Perking, FNP

## 2021-01-24 NOTE — Patient Instructions (Signed)
Bee, Wasp, or [employer], Adult Bees, wasps, and hornets are part of a family of insects that can sting people. These stings can cause pain and inflammation, but they are usually not serious. However, some people may have an allergic reaction to a sting. This can cause the symptoms to be more severe. What increases the risk? You may be at a greater risk of getting stung if you: Provoke a stinging insect by swatting or disturbing it. Wear strong-smelling soaps, deodorants, or body sprays. Spend time outdoors near gardens with flowers or fruit trees or in clothes that expose skin. Eat or drink outside. What are the signs or symptoms? Common symptoms of this condition include: A red lump in the skin that sometimes has a tiny hole in the center. In some cases, a stinger may be in the center of the wound. Pain and itching at the sting site. Redness and swelling around the sting site. If you have an allergic reaction (localized allergic reaction), the swelling and redness may spread out from the sting site. In some cases, this reaction can continue to develop over the next 24-48 hours. In rare cases, a person may have a severe allergic reaction (anaphylactic reaction) to a sting. Symptoms of an anaphylactic reaction may include: Wheezing or difficulty breathing. Raised, itchy, red patches on the skin (hives). Nausea or vomiting. Abdominal cramping. Diarrhea. Tightness in the chest or chest pain. Dizziness or fainting. Redness of the face (flushing). Hoarse voice. Swollen tongue, lips, or face. How is this diagnosed? This condition is usually diagnosed based on your symptoms and medical history as well as a physical exam. You may have an allergy test to determine if you are allergic to the substance that the insect injected during the sting (venom). How is this treated? If you were stung by a bee, the stinger and a small sac of venom may be in the wound. It is important to remove the stinger as  soon as possible. You can do this by brushing across the wound with gauze, a fingernail, or a flat card such as a credit card. Removing the stinger can help reduce the severity of your body's reaction to the sting. Most stings can be treated with: Icing to reduce swelling in the area. Medicines (antihistamines) to treat itching or an allergic reaction. Medicines to help reduce pain. These may be medicines that you take by mouth, or medicated creams or lotions that you apply to your skin. Pay close attention to your symptoms after you have been stung. If possible, have someone stay with you to make sure you do not have an allergic reaction. If you have any signs of an allergic reaction, call your health care provider. If you have ever had a severe allergic reaction, your health care provider may give you an inhaler or injectable medicine (epinephrine auto-injector) to use if necessary. Follow these instructions at home:  Wash the sting site 2-3 times each day with soap and water as told by your health care provider. Apply or take over-the-counter and prescription medicines only as told by your health care provider. If directed, apply ice to the sting area. Put ice in a plastic bag. Place a towel between your skin and the bag. Leave the ice on for 20 minutes, 2-3 times a day. Do not scratch the sting area. If you had a severe allergic reaction to a sting, you may need: To wear a medical bracelet or necklace that lists the allergy. To learn when and how  an anaphylaxis kit or epinephrine injection. Your family members and coworkers may also need to learn this. To carry an anaphylaxis kit or epinephrine injection with you at all times. How is this prevented? Avoid swatting at stinging insects and disturbing insect nests. Do not use fragrant soaps or lotions. Wear shoes, pants, and long sleeves when spending time outdoors, especially in grassy areas where stinging insects are common. Keep  outdoor areas free from nests or hives. Keep food and drink containers covered when eating outdoors. Avoid working or sitting near flowering plants, if possible. Wear gloves if you are gardening or working outdoors. If an attack by a stinging insect or a swarm seems likely in the moment, move away from the area or find a barrier between you and the insect(s), such as a door. Contact a health care provider if: Your symptoms do not get better in 2-3 days. You have redness, swelling, or pain that spreads beyond the area of the sting. You have a fever. Get help right away if: You have symptoms of a severe allergic reaction. These include: Wheezing or difficulty breathing. Tightness in the chest or chest pain. Light-headedness or fainting. Itchy, raised, red patches on the skin. Nausea or vomiting. Abdominal cramping. Diarrhea. A swollen tongue or lips, or trouble swallowing. Dizziness or fainting. Summary Stings from bees, wasps, and hornets can cause pain and inflammation, but they are usually not serious. However, some people may have an allergic reaction to a sting. This can cause the symptoms to be more severe. Pay close attention to your symptoms after you have been stung. If possible, have someone stay with you to make sure you do not have an allergic reaction. Call your health care provider if you have any signs of an allergic reaction. This information is not intended to replace advice given to you by your health care provider. Make sure you discuss any questions you have with your healthcare provider. Document Revised: 01/11/2020 Document Reviewed: 01/11/2020 Elsevier Patient Education  2022 [employer] Inc[employer]  

## 2021-02-06 ENCOUNTER — Encounter: Payer: Self-pay | Admitting: Family Medicine

## 2021-02-06 ENCOUNTER — Emergency Department (HOSPITAL_COMMUNITY)
Admission: EM | Admit: 2021-02-06 | Discharge: 2021-02-06 | Disposition: A | Discharge status: Home / Self Care | Payer: Medicare HMO | Attending: Emergency Medicine | Admitting: Emergency Medicine

## 2021-02-06 ENCOUNTER — Ambulatory Visit (INDEPENDENT_AMBULATORY_CARE_PROVIDER_SITE_OTHER): Payer: Medicare HMO | Admitting: Family Medicine

## 2021-02-06 ENCOUNTER — Emergency Department (HOSPITAL_COMMUNITY): Payer: Medicare HMO

## 2021-02-06 ENCOUNTER — Other Ambulatory Visit: Payer: Self-pay

## 2021-02-06 VITALS — BP 144/79 | HR 60 | Temp 97.9°F | Ht 61.0 in | Wt 101.0 lb

## 2021-02-06 DIAGNOSIS — I480 Paroxysmal atrial fibrillation: Secondary | ICD-10-CM | POA: Insufficient documentation

## 2021-02-06 DIAGNOSIS — L089 Local infection of the skin and subcutaneous tissue, unspecified: Secondary | ICD-10-CM | POA: Insufficient documentation

## 2021-02-06 DIAGNOSIS — Z950 Presence of cardiac pacemaker: Secondary | ICD-10-CM | POA: Diagnosis not present

## 2021-02-06 DIAGNOSIS — E039 Hypothyroidism, unspecified: Secondary | ICD-10-CM | POA: Diagnosis not present

## 2021-02-06 DIAGNOSIS — Z79899 Other long term (current) drug therapy: Secondary | ICD-10-CM | POA: Insufficient documentation

## 2021-02-06 DIAGNOSIS — L03115 Cellulitis of right lower limb: Secondary | ICD-10-CM

## 2021-02-06 DIAGNOSIS — T148XXA Other injury of unspecified body region, initial encounter: Secondary | ICD-10-CM

## 2021-02-06 DIAGNOSIS — I10 Essential (primary) hypertension: Secondary | ICD-10-CM | POA: Diagnosis not present

## 2021-02-06 DIAGNOSIS — M7989 Other specified soft tissue disorders: Secondary | ICD-10-CM | POA: Diagnosis not present

## 2021-02-06 DIAGNOSIS — M79672 Pain in left foot: Secondary | ICD-10-CM | POA: Diagnosis present

## 2021-02-06 DIAGNOSIS — Z7901 Long term (current) use of anticoagulants: Secondary | ICD-10-CM | POA: Insufficient documentation

## 2021-02-06 LAB — SYNOVIAL CELL COUNT + DIFF, W/ CRYSTALS
Lymphocytes-Synovial Fld: 4 % (ref 0–20)
Neutrophil, Synovial: 96 % — ABNORMAL HIGH (ref 0–25)
WBC, Synovial: 38800 /mm3 — ABNORMAL HIGH (ref 0–200)

## 2021-02-06 LAB — CBC WITH DIFFERENTIAL/PLATELET
Abs Immature Granulocytes: 0.03 10*3/uL (ref 0.00–0.07)
Basophils Absolute: 0 10*3/uL (ref 0.0–0.1)
Basophils Relative: 0 %
Eosinophils Absolute: 0 10*3/uL (ref 0.0–0.5)
Eosinophils Relative: 0 %
HCT: 47.9 % — ABNORMAL HIGH (ref 36.0–46.0)
Hemoglobin: 16.7 g/dL — ABNORMAL HIGH (ref 12.0–15.0)
Immature Granulocytes: 0 %
Lymphocytes Relative: 12 %
Lymphs Abs: 1 10*3/uL (ref 0.7–4.0)
MCH: 34.8 pg — ABNORMAL HIGH (ref 26.0–34.0)
MCHC: 34.9 g/dL (ref 30.0–36.0)
MCV: 99.8 fL (ref 80.0–100.0)
Monocytes Absolute: 0.5 10*3/uL (ref 0.1–1.0)
Monocytes Relative: 6 %
Neutro Abs: 6.3 10*3/uL (ref 1.7–7.7)
Neutrophils Relative %: 82 %
Platelets: 128 10*3/uL — ABNORMAL LOW (ref 150–400)
RBC: 4.8 MIL/uL (ref 3.87–5.11)
RDW: 19 % — ABNORMAL HIGH (ref 11.5–15.5)
WBC: 7.8 10*3/uL (ref 4.0–10.5)
nRBC: 0 % (ref 0.0–0.2)

## 2021-02-06 LAB — COMPREHENSIVE METABOLIC PANEL
ALT: 36 U/L (ref 0–44)
AST: 43 U/L — ABNORMAL HIGH (ref 15–41)
Albumin: 3.8 g/dL (ref 3.5–5.0)
Alkaline Phosphatase: 72 U/L (ref 38–126)
Anion gap: 13 (ref 5–15)
BUN: 21 mg/dL (ref 8–23)
CO2: 19 mmol/L — ABNORMAL LOW (ref 22–32)
Calcium: 9.3 mg/dL (ref 8.9–10.3)
Chloride: 105 mmol/L (ref 98–111)
Creatinine, Ser: 0.8 mg/dL (ref 0.44–1.00)
GFR, Estimated: >60 mL/min (ref >60)
Glucose, Bld: 107 mg/dL — ABNORMAL HIGH (ref 70–99)
Potassium: 3.8 mmol/L (ref 3.5–5.1)
Sodium: 137 mmol/L (ref 135–145)
Total Bilirubin: 2 mg/dL — ABNORMAL HIGH (ref 0.3–1.2)
Total Protein: 6.9 g/dL (ref 6.5–8.1)

## 2021-02-06 LAB — GRAM STAIN

## 2021-02-06 MED ORDER — AMOXICILLIN-POT CLAVULANATE 875-125 MG PO TABS
1.0000 | ORAL_TABLET | Freq: Once | ORAL | Status: AC
  Administered 2021-02-06: 1 via ORAL
  Filled 2021-02-06: qty 1

## 2021-02-06 MED ORDER — AMOXICILLIN-POT CLAVULANATE 875-125 MG PO TABS: 1.0000 | ORAL_TABLET | Freq: Two times a day (BID) | ORAL | 0 refills | Status: DC

## 2021-02-06 NOTE — ED Provider Notes (Signed)
[facility]   CSN: [id number] Arrival date & time: 02/06/21  1606     History Chief Complaint  Patient presents with   Foot Pain    [name] [name] is a 87 y.o. female.  HPI She presents for evaluation of worsening foot infection, following multiple stings after she stepped on a hornets nest, 2 weeks ago. She was initially treated with prednisone, and Zyrtec, was felt to be worsened when evaluated today.  Here with daughter who gives history.  There has been no fever, vomiting, change in chronic health status.  The patient has been managing the wound by herself.  She lives alone.  She has been soaking it in warm to hot water, daily while taking prednisone.  She is having trouble walking because of the discomfort of the left foot.    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   Hyperlipidemia    Hypertension    Paroxysmal atrial fibrillation ([place]) 2016   single episode detected on PPM interrogation    Patient Active Problem List   Diagnosis Date Noted   Mild dementia 12/20/2020   Epidermal cyst 11/03/2017   Hypothyroidism 06/09/2017   Vitamin D deficiency 05/16/2016   Atrial fibrillation ([place]) 01/06/2015   Paroxysmal atrial fibrillation ([place]) 04/01/2014   AV BLOCK, COMPLETE 04/25/2010   HLD (hyperlipidemia) 10/30/2008   HYPERTENSION, BENIGN 10/30/2008   PACEMAKER, PERMANENT 10/30/2008    Past Surgical History:  Procedure Laterality Date   ABDOMINAL HYSTERECTOMY     EYE SURGERY Bilateral    cataracts   GALLBLADDER SURGERY     Normal coronary angiography  1999   PPM GENERATOR CHANGEOUT N/A 01/15/2017   [employer] Accolade MRI EL DL pacemaker for CHB by Dr Rayann Heman   PULSE GENERATOR IMPLANT     Guidant Ultra dual-mode, dual-pacing, dual-sensing generator for complete atrioventricular block 2006 with some noise on atrial lead and generator recall for very low incidence of loss of  output     OB History   No obstetric history on file.     Family History  Problem Relation Age of Onset   Cancer Sister        breast    Social History   Tobacco Use   Smoking status: Never   Smokeless tobacco: Never  Substance Use Topics   Alcohol use: No    Alcohol/week: 0.0 standard drinks   Drug use: No    Home Medications Prior to Admission medications   Medication Sig Start Date End Date Taking? Authorizing Provider  amoxicillin-clavulanate (AUGMENTIN) 875-125 MG tablet Take 1 tablet by mouth 2 (two) times daily. One po bid x 7 days 02/06/21  Yes Daleen Bo, MD  apixaban (ELIQUIS) 5 MG TABS tablet Take 1 tablet (5 mg total) by mouth 2 (two) times daily. (Needs to be seen before next refill) 09/29/20   Janora Norlander, DO  atorvastatin (LIPITOR) 10 MG tablet Take 1 tablet (10 mg total) by mouth every evening. 10/30/20   Janora Norlander, DO  Cholecalciferol (VITAMIN D3) 1.25 MG (50000 UT) CAPS TAKE 1 CAPSULE (50,000 UNITS TOTAL) BY MOUTH EVERY 7 (SEVEN) DAYS. X12 WEEKS 08/02/19   Ronnie Doss M, DO  diltiazem (CARDIZEM CD) 120 MG 24 hr capsule TAKE 1 CAPSULE BY MOUTH EVERY DAY 09/14/20   Ronnie Doss M, DO  dorzolamide-timolol (COSOPT) 22.3-6.8 MG/ML ophthalmic solution Place 1 drop into both eyes 2 (two) times daily. 04/29/19  [provider]  hydrochlorothiazide (HYDRODIURIL) 12.5 MG tablet TAKE 1 TABLET BY MOUTH EVERY DAY 09/14/20   Delynn Flavin M, DO  levothyroxine (SYNTHROID) 75 MCG tablet Take 1 tablet (75 mcg total) by mouth daily before breakfast. 11/29/20   Delynn Flavin M, DO  memantine (NAMENDA) 5 MG tablet Take 1 tablet (5 mg total) by mouth daily for 30 days, THEN 1 tablet (5 mg total) 2 (two) times daily. 01/10/21 03/11/21  Raliegh Ip, DO  Multiple Vitamins-Minerals (CENTRUM SILVER 50+WOMEN) TABS Take 1 tablet by mouth daily.    [provider]  Multiple Vitamins-Minerals (MULTIVITAMIN WITH MINERALS) tablet Take 1  tablet by mouth daily.    [provider]  neomycin-polymyxin b-dexamethasone (MAXITROL) 3.5-10000-0.1 OINT SMARTSIG:Sparingly In Eye(s) 3-4 Times Daily 12/28/20   [provider]    Allergies    Patient has no known allergies.  Review of Systems   Review of Systems  All other systems reviewed and are negative.  Physical Exam Updated Vital Signs BP (!) 151/75   Pulse 68   Temp 97.9 F (36.6 C) (Oral)   Resp 14   SpO2 100%   Physical Exam Vitals and nursing note reviewed.  Constitutional:      Appearance: She is well-developed. She is not ill-appearing.  HENT:     Head: Normocephalic and atraumatic.     Right Ear: External ear normal.     Left Ear: External ear normal.  Eyes:     Conjunctiva/sclera: Conjunctivae normal.     Pupils: Pupils are equal, round, and reactive to light.  Neck:     Trachea: Phonation normal.  Cardiovascular:     Rate and Rhythm: Normal rate.  Pulmonary:     Effort: Pulmonary effort is normal.  Abdominal:     General: There is no distension.  Musculoskeletal:        General: Normal range of motion.     Cervical back: Normal range of motion and neck supple.     Comments: Left foot dorsum with swelling, and erythema.  There is a large bulla, close to the toes.  See image documented.  The center area appears to have hemolyzed blood products.  There is clear drainage at several sites on the dorsum of the left foot.  The area is mildly tender to palpation.  There is no proximal erythema, or distal ischemic abnormality.  Skin:    General: Skin is warm.  Neurological:     Mental Status: She is alert.     Cranial Nerves: No cranial nerve deficit.     Sensory: No sensory deficit.     Motor: No abnormal muscle tone.     Coordination: Coordination normal.  Psychiatric:        Mood and Affect: Mood normal.        Behavior: Behavior normal.           ED Results / Procedures / Treatments   Labs (all labs ordered are listed,  but only abnormal results are displayed) Labs Reviewed  COMPREHENSIVE METABOLIC PANEL - Abnormal; Notable for the following components:      Result Value   CO2 19 (*)    Glucose, Bld 107 (*)    AST 43 (*)    Total Bilirubin 2.0 (*)    All other components within normal limits  CBC WITH DIFFERENTIAL/PLATELET - Abnormal; Notable for the following components:   Hemoglobin 16.7 (*)    HCT 47.9 (*)    MCH 34.8 (*)  RDW 19.0 (*)    Platelets 128 (*)    All other components within normal limits  BODY FLUID CULTURE W GRAM STAIN  GRAM STAIN  SYNOVIAL CELL COUNT + DIFF, W/ CRYSTALS  GLUCOSE, BODY FLUID OTHER              EKG None  Radiology DG Foot Complete Left  Result Date: 02/06/2021 CLINICAL DATA:  Left foot swelling with redness and drainage after insect sting 2 weeks ago EXAM: LEFT FOOT - COMPLETE 3+ VIEW COMPARISON:  None. FINDINGS: There is no evidence of fracture or dislocation. No erosion or periosteal elevation. Degenerative changes are most pronounced within the interphalangeal joints of the second and third toes. Prominent soft tissue swelling about the forefoot. No soft tissue gas. IMPRESSION: 1. No radiographic evidence to suggest osteomyelitis. 2. Prominent soft tissue swelling about the forefoot. No soft tissue gas. Electronically Signed   By: Duanne GuessNicholas  Plundo D.O.   On: 02/06/2021 16:47    Procedures Wound repair  Date/Time: 02/06/2021 7:10 PM Performed by: Mancel BaleWentz, Ashe Graybeal, MD Authorized by: Mancel BaleWentz, Blaike Vickers, MD  Consent: Verbal consent obtained. Consent given by: patient (And daughter) Patient identity confirmed: verbally with patient Preparation: Patient was prepped and draped in the usual sterile fashion. Local anesthesia used: no  Anesthesia: Local anesthesia used: no  Sedation: Patient sedated: no  Patient tolerance: patient tolerated the procedure well with no immediate complications Comments: Large blister on dorsal left foot was aspirated using 18-gauge  needle.  I was able to recover about a cc of serosanguineous thin fluid.  This improve the appearance of the foot.  The dorsum of the foot was cleansed with Chloraseptic scrub brush and irrigated well with saline.  There is a quarter size area, dorsal lateral forefoot which appears to be a full-thickness skin injury.  The etiology of this is possibly consistent with secondary injury from hot water.     Medications Ordered in ED Medications  amoxicillin-clavulanate (AUGMENTIN) 875-125 MG per tablet 1 tablet (1 tablet Oral Given 02/06/21 1942)    ED Course  I have reviewed the triage vital signs and the nursing notes.  Pertinent labs & imaging results that were available during my care of the patient were reviewed by me and considered in my medical decision making (see chart for details).    MDM Rules/Calculators/A&P                            Patient Vitals for the past 24 hrs:  BP Temp Temp src Pulse Resp SpO2  02/06/21 1933 -- -- -- 68 -- 100 %  02/06/21 1930 (!) 151/75 -- -- -- -- --  02/06/21 1900 (!) 153/80 -- -- 97 14 99 %  02/06/21 1830 (!) 155/81 -- -- -- -- --  02/06/21 1800 (!) 152/77 -- -- (!) 32 -- 98 %  02/06/21 1757 (!) 148/78 -- -- (!) 46 -- 99 %  02/06/21 1736 -- -- -- 65 -- 100 %  02/06/21 1709 -- -- -- (!) 57 -- 98 %  02/06/21 1708 -- -- -- (!) 52 -- 100 %  02/06/21 1649 132/76 -- -- -- -- --  02/06/21 1621 133/80 97.9 F (36.6 C) Oral 60 17 (!) 81 %    7:12 PM Reevaluation with update and discussion. After initial assessment and treatment, an updated evaluation reveals she remains fairly comfortable.  Findings discussed with patient and her daughter, all questions were answered. Mancel BaleElliott Tichina Koebel  Medical Decision Making:  This patient is presenting for evaluation of injury to left foot with concern for infection when evaluated today by PCP, which does require a range of treatment options, and is a complaint that involves a moderate risk of morbidity and  mortality. The differential diagnoses include superficial infection, deep tissue infection, allergic reaction. I decided to review old records, and in summary elderly female, presenting with worsening swelling after multiple hornet stings to left foot.  She has history of hypertension, permanent pacemaker, atrial fibrillation.  She is anticoagulated with Eliquis I obtained additional historical information from daughter at.  Clinical Laboratory Tests Ordered, included CBC, Metabolic panel, and fluid analysis . Review indicates initial results, CO2 low, glucose high, AST high, total bilirubin high, hemoglobin high, platelets low. Radiologic Tests Ordered, included left foot x-ray.  I independently Visualized: Radiograph images, which show no osteomyelitis or    Critical Interventions-clinical evaluation, laboratory testing, radiography, aspiration of blister fluid material, observation and reassessment  After These Interventions, the Patient was reevaluated and was found stable for discharge.  The wound of her foot is primarily a blister with secondary inflammatory reaction and skin discoloration.  No sign of deep tissue infection.  No indication for debridement of blister at this time.  The blister was decompressed with aspiration of 9 cc of serosanguineous fluid.  This was sent for analysis.  Full analysis pending at the time of discharge.  Patient started on oral antibiotics and given instruction for wound care.  We are trying to obtain referral to wound care with the assistance of social work.  This may be delayed until social services is available tomorrow.  Patient's daughter will help the patient get the care needed.  She will be going home with family members.  CRITICAL CARE-no Performed by: Mancel Bale  Nursing Notes Reviewed/ Care Coordinated Applicable Imaging Reviewed Interpretation of Laboratory Data incorporated into ED treatment  The patient appears reasonably screened and/or  stabilized for discharge and I doubt any other medical condition or other [facility] requiring further screening, evaluation, or treatment in the ED at this time prior to discharge.  Plan: Home Medications-continue usual; Home Treatments-wound care as described; return here if the recommended treatment, does not improve the symptoms; Recommended follow up-PCP and/or wound management follow-up as soon as possible.     Final Clinical Impression(s) / ED Diagnoses Final diagnoses:  Wound infection    Rx / DC Orders ED Discharge Orders          Ordered    amoxicillin-clavulanate (AUGMENTIN) 875-125 MG tablet  2 times daily        02/06/21 1955             Mancel Bale, MD 02/06/21 2001

## 2021-02-06 NOTE — Discharge Instructions (Signed)
The swelling and discoloration of the left foot are likely related to the insect stings, with secondary allergic reaction.  There is no clear sign of infection.  We sent fluid for analysis that will give more information in the next couple of days.  Your doctor can help you evaluate these results.  Call your doctor for follow-up appointment to be seen later this week.  We sent a prescription for antibiotics to your pharmacy to start taking tomorrow, morning.  For wound care of the left foot, try to elevate it above your heart is much as possible.  Also begin cleansing the left foot with soap and water, tomorrow afternoon.  After cleaning, rinse well and dry then apply a light bandage with nonstick gauze, to help the wound heal.  We have contacted social worker to help arrange follow-up in the wound care clinic, as well.  Hopefully that can be done in the next day or 2.  Return here, if needed for problems

## 2021-02-06 NOTE — Progress Notes (Signed)
Subjective:  Patient ID: [name] [name], female    DOB: 1924/12/21, 87 y.o.   MRN: [id number]  Patient Care Team: Janora Norlander, DO as PCP - General (Family Medicine) Thompson Grayer, MD as PCP - Electrophysiology (Cardiology) Celestia Khat, [place] (Optometry) Stacie Glaze, Jacqualyn Posey, MD as Consulting Physician (Ophthalmology) Steffanie Rainwater, DPM as Consulting Physician (Podiatry)   Chief Complaint:  Insect Bite (Follow up from 1 week ago, not getting better/)   HPI: [name] [name] is a 87 y.o. female presenting on 02/06/2021 for Insect Bite (Follow up from 1 week ago, not getting better/)   Pt presents today for reevaluation of wound to right foot from possible yellow jacket sting. She was seen in office 01/24/2021 for bee sting to foot and treated with prednisone, topical benadryl, and zyrtec. She presents today with family with complaints of worsening symptoms. States foot is swelling more, has an open wound, drainage, tenderness, and redness. She denies fever. She has underlying dementia and family reports she is baseline other than increased fatigue.    Relevant past medical, surgical, family, and social history reviewed and updated as indicated.  Allergies and medications reviewed and updated. Data reviewed: Chart in Epic.   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   Hyperlipidemia    Hypertension    Paroxysmal atrial fibrillation ([place]) 2016   single episode detected on PPM interrogation    Past Surgical History:  Procedure Laterality Date   ABDOMINAL HYSTERECTOMY     EYE SURGERY Bilateral    cataracts   GALLBLADDER SURGERY     Normal coronary angiography  1999   PPM GENERATOR CHANGEOUT N/A 01/15/2017   [employer] Accolade MRI EL DL pacemaker for CHB by Dr Rayann Heman   PULSE GENERATOR IMPLANT     Guidant Ultra dual-mode, dual-pacing, dual-sensing generator for complete atrioventricular block 2006 with some noise on atrial lead  and generator recall for very low incidence of loss of output    Social History   Socioeconomic History   Marital status: Widowed    Spouse name: Not on file   Number of children: 2   Years of education: Not on file   Highest education level: Not on file  Occupational History   Not on file  Tobacco Use   Smoking status: Never   Smokeless tobacco: Never  Substance and Sexual Activity   Alcohol use: No    Alcohol/week: 0.0 standard drinks   Drug use: No   Sexual activity: Not on file  Other Topics Concern   Not on file  Social History Narrative   Lives alone   Son lives next door, daughter lives in [place]   Social Determinants of Health   Financial Resource Strain: Low Risk    Difficulty of Paying Living Expenses: Not hard at all  Food Insecurity: No Food Insecurity   Worried About Charity fundraiser in the Last Year: Never true   Arboriculturist in the Last Year: Never true  Transportation Needs: No Transportation Needs   Lack of Transportation (Medical): No   Lack of Transportation (Non-Medical): No  Physical Activity: Insufficiently Active   Days of Exercise per Week: 7 days   Minutes of Exercise per Session: 20 min  Stress: No Stress Concern Present   Feeling of Stress : Only a little  Social Connections: Moderately Integrated   Frequency of Communication with Friends and Family: More than three times a week  Frequency of Social Gatherings with Friends and Family: More than three times a week   Attends Religious Services: More than 4 times per year   Active Member of Clubs or Organizations: Yes   Attends Archivist Meetings: More than 4 times per year   Marital Status: Widowed  Intimate Partner Violence: Not At Risk   Fear of Current or Ex-Partner: No   Emotionally Abused: No   Physically Abused: No   Sexually Abused: No    Outpatient Encounter Medications as of 02/06/2021  Medication Sig   apixaban (ELIQUIS) 5 MG TABS tablet Take 1 tablet (5  mg total) by mouth 2 (two) times daily. (Needs to be seen before next refill)   atorvastatin (LIPITOR) 10 MG tablet Take 1 tablet (10 mg total) by mouth every evening.   Cholecalciferol (VITAMIN D3) 1.25 MG (50000 UT) CAPS TAKE 1 CAPSULE (50,000 UNITS TOTAL) BY MOUTH EVERY 7 (SEVEN) DAYS. X12 WEEKS   diltiazem (CARDIZEM CD) 120 MG 24 hr capsule TAKE 1 CAPSULE BY MOUTH EVERY DAY   dorzolamide-timolol (COSOPT) 22.3-6.8 MG/ML ophthalmic solution Place 1 drop into both eyes 2 (two) times daily.   hydrochlorothiazide (HYDRODIURIL) 12.5 MG tablet TAKE 1 TABLET BY MOUTH EVERY DAY   levothyroxine (SYNTHROID) 75 MCG tablet Take 1 tablet (75 mcg total) by mouth daily before breakfast.   memantine (NAMENDA) 5 MG tablet Take 1 tablet (5 mg total) by mouth daily for 30 days, THEN 1 tablet (5 mg total) 2 (two) times daily.   Multiple Vitamins-Minerals (CENTRUM SILVER 50+WOMEN) TABS Take 1 tablet by mouth daily.   Multiple Vitamins-Minerals (MULTIVITAMIN WITH MINERALS) tablet Take 1 tablet by mouth daily.   neomycin-polymyxin b-dexamethasone (MAXITROL) 3.5-10000-0.1 OINT SMARTSIG:Sparingly In Eye(s) 3-4 Times Daily   No facility-administered encounter medications on file as of 02/06/2021.    No Known Allergies  Review of Systems  Constitutional:  Positive for fatigue. Negative for activity change, appetite change, chills, diaphoresis, fever and unexpected weight change.  Musculoskeletal:  Positive for gait problem.  Skin:  Positive for color change, rash and wound. Negative for pallor.  Neurological:  Negative for weakness.  Psychiatric/Behavioral:  Positive for confusion (baseline).   All other systems reviewed and are negative.      Objective:  BP (!) 144/79   Pulse 60   Temp 97.9 F (36.6 C)   Ht 5' 1" (1.549 m)   Wt 101 lb (45.8 kg)   SpO2 95%   BMI 19.08 kg/m    Wt Readings from Last 3 Encounters:  02/06/21 101 lb (45.8 kg)  01/24/21 102 lb (46.3 kg)  01/10/21 102 lb (46.3 kg)     Physical Exam Vitals and nursing note reviewed.  Constitutional:      General: She is not in acute distress.    Appearance: Normal appearance. She is normal weight. She is not ill-appearing, toxic-appearing or diaphoretic.  HENT:     Head: Normocephalic and atraumatic.  Eyes:     Pupils: Pupils are equal, round, and reactive to light.  Cardiovascular:     Rate and Rhythm: Normal rate and regular rhythm.  Pulmonary:     Effort: Pulmonary effort is normal.     Breath sounds: Normal breath sounds.  Skin:    General: Skin is warm.     Capillary Refill: Capillary refill takes less than 2 seconds.     Findings: Erythema and wound present.     Comments: Significant swelling to right foot with erythema, open wound to  top of foot with malodorous purulent drainage, and tenderness, two large bullous lesions to top of foot. See images below.  Neurological:     General: No focal deficit present.     Mental Status: She is alert. Mental status is at baseline.     Gait: Gait abnormal (in wheelchair).          Results for orders placed or performed in visit on 01/10/21  TSH  Result Value Ref Range   TSH 7.300 (H) 0.450 - 4.500 uIU/mL  T4, Free  Result Value Ref Range   Free T4 1.72 0.82 - 1.77 ng/dL  Basic Metabolic Panel  Result Value Ref Range   Glucose 98 70 - 99 mg/dL   BUN 11 10 - 36 mg/dL   Creatinine, Ser 0.81 0.57 - 1.00 mg/dL   eGFR 67 >59 mL/min/1.73   BUN/Creatinine Ratio 14 12 - 28   Sodium 140 134 - 144 mmol/L   Potassium 4.5 3.5 - 5.2 mmol/L   Chloride 100 96 - 106 mmol/L   CO2 20 20 - 29 mmol/L   Calcium 10.6 (H) 8.7 - 10.3 mg/dL       Pertinent labs & imaging results that were available during my care of the patient were reviewed by me and considered in my medical decision making.  Assessment & Plan:  Yaresly was seen today for insect bite.  Diagnoses and all orders for this visit:  Cellulitis of right foot Images above. Concerning for gangrenous  infection and potential osteomyelitis. Pt sent to ED for further evaluation and treatment. Dr. Eulis Foster aware of pt condition and aware she will be coming to ED. Pt will likely require IV antibiotics and debridement of wound.     Continue all other maintenance medications.  Follow up plan: To [facility] ED for evaluation and treatment.    The above assessment and management plan was discussed with the patient. The patient verbalized understanding of and has agreed to the management plan. Patient is aware to call the clinic if they develop any new symptoms or if symptoms persist or worsen. Patient is aware when to return to the clinic for a follow-up visit. Patient educated on when it is appropriate to go to the emergency department.   Monia Pouch, FNP-C [facility] [phone]

## 2021-02-06 NOTE — Patient Instructions (Signed)
To ED at [facility] for evaluation and treatment, potential admission with debridement of wound and IV antibiotics.

## 2021-02-06 NOTE — ED Triage Notes (Signed)
Left foot swollen, stepped in a yellow jacket nest 2 weeks ago, referred here by PCP

## 2021-02-07 ENCOUNTER — Ambulatory Visit (INDEPENDENT_AMBULATORY_CARE_PROVIDER_SITE_OTHER): Payer: Medicare HMO

## 2021-02-07 DIAGNOSIS — I495 Sick sinus syndrome: Secondary | ICD-10-CM | POA: Diagnosis not present

## 2021-02-07 LAB — CUP PACEART REMOTE DEVICE CHECK
Battery Remaining Longevity: 138 mo
Battery Remaining Percentage: 100 %
Brady Statistic RA Percent Paced: 0 %
Brady Statistic RV Percent Paced: 95 %
Date Time Interrogation Session: 20221109014100
Implantable Lead Implant Date: 19980323
Implantable Lead Implant Date: 19980323
Implantable Lead Location: 753859
Implantable Lead Location: 753860
Implantable Lead Model: 4524
Implantable Lead Model: 5034
Implantable Pulse Generator Implant Date: 20181017
Lead Channel Impedance Value: 398 Ohm
Lead Channel Impedance Value: 901 Ohm
Lead Channel Pacing Threshold Amplitude: 0.9 V
Lead Channel Pacing Threshold Pulse Width: 0.4 ms
Lead Channel Setting Pacing Amplitude: 2.5 V
Lead Channel Setting Pacing Pulse Width: 0.4 ms
Lead Channel Setting Sensing Sensitivity: 4 mV
Pulse Gen Serial Number: 807281

## 2021-02-07 NOTE — Addendum Note (Signed)
Addended by: Sonny Masters on: 02/07/2021 07:50 AM   Modules accepted: Orders

## 2021-02-08 LAB — GLUCOSE, BODY FLUID OTHER: Glucose, Body Fluid Other: <2 mg/dL

## 2021-02-10 LAB — BODY FLUID CULTURE W GRAM STAIN

## 2021-02-11 ENCOUNTER — Telehealth: Payer: Self-pay | Admitting: Emergency Medicine

## 2021-02-11 NOTE — Telephone Encounter (Signed)
Post ED Visit - Positive Culture Follow-up: Successful Patient Follow-Up  Culture assessed and recommendations reviewed by:  []  , Pharm.D. []  Enzo Bi, Pharm.D., BCPS AQ-ID []  , Pharm.D., BCPS []  Celedonio Miyamoto, .D., BCPS []  [place], .D., BCPS, AAHIVP []  [place]ina Pillion, Pharm.D., BCPS, AAHIVP []  [address], PharmD, BCPS []  , PharmD, BCPS []  [place], PharmD, BCPS [x]  [place], PharmD  Positive body fluid culture  []  Patient discharged without antimicrobial prescription and treatment is now indicated []  Organism is resistant to prescribed ED discharge antimicrobial []  Patient with positive blood cultures  Changes discussed with ED provider: PA Symptom check  Contacted patient, date 02/11/21, time 1430 Patient reports feeling better and has follow-up with PCP scheduled.    Langston Tuberville 02/11/2021, 6:21 PM

## 2021-02-12 ENCOUNTER — Telehealth: Payer: Self-pay | Admitting: Family Medicine

## 2021-02-12 NOTE — Telephone Encounter (Signed)
Appointment given for 1:50pm on Wed 11/16 with Kari Baars, FNP.

## 2021-02-14 ENCOUNTER — Encounter: Payer: Self-pay | Admitting: Family Medicine

## 2021-02-14 ENCOUNTER — Ambulatory Visit (INDEPENDENT_AMBULATORY_CARE_PROVIDER_SITE_OTHER): Payer: Medicare HMO | Admitting: Family Medicine

## 2021-02-14 ENCOUNTER — Other Ambulatory Visit: Payer: Self-pay

## 2021-02-14 VITALS — BP 134/88 | HR 69 | Temp 97.4°F | Ht 61.0 in | Wt 101.0 lb

## 2021-02-14 DIAGNOSIS — L819 Disorder of pigmentation, unspecified: Secondary | ICD-10-CM | POA: Diagnosis not present

## 2021-02-14 DIAGNOSIS — L03115 Cellulitis of right lower limb: Secondary | ICD-10-CM

## 2021-02-14 MED ORDER — DOXYCYCLINE HYCLATE 100 MG PO TABS: 100.0000 mg | ORAL_TABLET | Freq: Two times a day (BID) | ORAL | 0 refills | Status: DC

## 2021-02-14 NOTE — Progress Notes (Signed)
Subjective:  Patient ID: [name] [name], female    DOB: Oct 09, 1924, 87 y.o.   MRN: [id number]  Patient Care Team: Raliegh Ip, DO as PCP - General (Family Medicine) Hillis Range, MD as PCP - Electrophysiology (Cardiology) Delora Fuel, [place] (Optometry) Jenene Slicker, Deberah Castle, MD as Consulting Physician (Ophthalmology) Adam Phenix, DPM as Consulting Physician (Podiatry)   Chief Complaint:  Insect Bite (ED f/u) and Wound Infection   HPI: [name] [name] is a 87 y.o. female presenting on 02/14/2021 for Insect Bite (ED f/u) and Wound Infection  Patient presents today for wound recheck.  Patient was initially seen in office on 01/24/2021 for bee sting.  Patient return to office on 02/06/2021 with significant wound infection to the right foot.  Patient was sent to the ED for further evaluation, ED completed plain films which did not reveal osteomyelitis, and sent patient home on Augmentin.  Wound was not I&D'd during ED visit.  Stat referral to wound care was placed, patient has yet to follow-up with wound care as appointment has not been received.  She returns today for reevaluation.  She denies any fever, chills, weakness, or increased confusion.  She states the foot does hurt at times but is usually not painful.  Relevant past medical, surgical, family, and social history reviewed and updated as indicated.  Allergies and medications reviewed and updated. Data reviewed: Chart in Epic.   Past Medical History:  Diagnosis Date   Atrioventricular block, complete ([facility]) 2006   Hyperlipidemia    Hypertension    Paroxysmal atrial fibrillation ([facility]) 2016   single episode detected on PPM interrogation    Past Surgical History:  Procedure Laterality Date   ABDOMINAL HYSTERECTOMY     EYE SURGERY Bilateral    cataracts   GALLBLADDER SURGERY     Normal coronary angiography  1999   PPM GENERATOR CHANGEOUT N/A 01/15/2017   [employer] Accolade MRI EL DL pacemaker for CHB by Dr Johney Frame    PULSE GENERATOR IMPLANT     Guidant Ultra dual-mode, dual-pacing, dual-sensing generator for complete atrioventricular block 2006 with some noise on atrial lead and generator recall for very low incidence of loss of output    Social History   Socioeconomic History   Marital status: Widowed    Spouse name: Not on file   Number of children: 2   Years of education: Not on file   Highest education level: Not on file  Occupational History   Not on file  Tobacco Use   Smoking status: Never   Smokeless tobacco: Never  Substance and Sexual Activity   Alcohol use: No    Alcohol/week: 0.0 standard drinks   Drug use: No   Sexual activity: Not on file  Other Topics Concern   Not on file  Social History Narrative   Lives alone   Son lives next door, daughter lives in [place]   Social Determinants of Health   Financial Resource Strain: Low Risk    Difficulty of Paying Living Expenses: Not hard at all  Food Insecurity: No Food Insecurity   Worried About Programme researcher, broadcasting/film/video in the Last Year: Never true   Barista in the Last Year: Never true  Transportation Needs: No Transportation Needs   Lack of Transportation (Medical): No   Lack of Transportation (Non-Medical): No  Physical Activity: Insufficiently Active   Days of Exercise per Week: 7 days   Minutes of Exercise per Session: 20 min  Stress:  No Stress Concern Present   Feeling of Stress : Only a little  Social Connections: Moderately Integrated   Frequency of Communication with Friends and Family: More than three times a week   Frequency of Social Gatherings with Friends and Family: More than three times a week   Attends Religious Services: More than 4 times per year   Active Member of [employer] or Organizations: Yes   Attends Archivist Meetings: More than 4 times per year   Marital Status: Widowed  Intimate Partner Violence: Not At Risk   Fear of Current or Ex-Partner: No   Emotionally Abused: No   Physically  Abused: No   Sexually Abused: No    Outpatient Encounter Medications as of 02/14/2021  Medication Sig   apixaban (ELIQUIS) 5 MG TABS tablet Take 1 tablet (5 mg total) by mouth 2 (two) times daily. (Needs to be seen before next refill)   atorvastatin (LIPITOR) 10 MG tablet Take 1 tablet (10 mg total) by mouth every evening.   Cholecalciferol (VITAMIN D3) 1.25 MG (50000 UT) CAPS TAKE 1 CAPSULE (50,000 UNITS TOTAL) BY MOUTH EVERY 7 (SEVEN) DAYS. X12 WEEKS   diltiazem (CARDIZEM CD) 120 MG 24 hr capsule TAKE 1 CAPSULE BY MOUTH EVERY DAY   dorzolamide-timolol (COSOPT) 22.3-6.8 MG/ML ophthalmic solution Place 1 drop into both eyes 2 (two) times daily.   doxycycline (VIBRA-TABS) 100 MG tablet Take 1 tablet (100 mg total) by mouth 2 (two) times daily for 10 days. 1 po bid   hydrochlorothiazide (HYDRODIURIL) 12.5 MG tablet TAKE 1 TABLET BY MOUTH EVERY DAY   levothyroxine (SYNTHROID) 75 MCG tablet Take 1 tablet (75 mcg total) by mouth daily before breakfast.   memantine (NAMENDA) 5 MG tablet Take 1 tablet (5 mg total) by mouth daily for 30 days, THEN 1 tablet (5 mg total) 2 (two) times daily.   Multiple Vitamins-Minerals (CENTRUM SILVER 50+WOMEN) TABS Take 1 tablet by mouth daily.   Multiple Vitamins-Minerals (MULTIVITAMIN WITH MINERALS) tablet Take 1 tablet by mouth daily.   neomycin-polymyxin b-dexamethasone (MAXITROL) 3.5-10000-0.1 OINT SMARTSIG:Sparingly In Eye(s) 3-4 Times Daily   [DISCONTINUED] amoxicillin-clavulanate (AUGMENTIN) 875-125 MG tablet Take 1 tablet by mouth 2 (two) times daily. One po bid x 7 days   No facility-administered encounter medications on file as of 02/14/2021.    No Known Allergies  Review of Systems  Constitutional:  Negative for activity change, appetite change, chills, diaphoresis, fatigue, fever and unexpected weight change.  Cardiovascular:  Positive for leg swelling. Negative for chest pain.  Gastrointestinal:  Negative for abdominal pain.  Genitourinary:   Negative for decreased urine volume.  Skin:  Positive for color change and wound.  Neurological:  Negative for syncope and weakness.  Psychiatric/Behavioral:  Positive for confusion (baseline).   All other systems reviewed and are negative.      Objective:  BP 134/88   Pulse 69   Temp (!) 97.4 F (36.3 C)   Ht 5\' 1"  (1.549 m)   Wt 101 lb (45.8 kg)   BMI 19.08 kg/m    Wt Readings from Last 3 Encounters:  02/14/21 101 lb (45.8 kg)  02/06/21 101 lb (45.8 kg)  01/24/21 102 lb (46.3 kg)    Physical Exam Vitals and nursing note reviewed.  Constitutional:      General: She is not in acute distress.    Appearance: Normal appearance. She is normal weight. She is not ill-appearing, toxic-appearing or diaphoretic.  HENT:     Head: Normocephalic and atraumatic.  Cardiovascular:     Rate and Rhythm: Normal rate and regular rhythm.  Pulmonary:     Effort: Pulmonary effort is normal.     Breath sounds: Normal breath sounds.  Musculoskeletal:     Right lower leg: 1+ Pitting Edema present.     Left lower leg: 1+ Pitting Edema present.  Skin:    General: Skin is warm and dry.     Findings: Ecchymosis, erythema and wound present.       Neurological:     General: No focal deficit present.     Mental Status: She is alert. Mental status is at baseline.     Motor: No weakness.     Gait: Gait normal.  Psychiatric:        Mood and Affect: Mood normal.        Behavior: Behavior normal.        Results for orders placed or performed in visit on 02/07/21  CUP PACEART REMOTE DEVICE CHECK  Result Value Ref Range   Date Time Interrogation Session [id number]    Pulse Generator Manufacturer BOST    Pulse Gen Model L331 ACCOLADE MRI EL    Pulse Gen Serial Number [id number]    Clinic Name [facility] Pulse Generator Type Implantable Pulse Generator    Implantable Pulse Generator Implant Date [id number]    Implantable Lead Manufacturer MERM    Implantable Lead Model  4524 CapSure SP    Implantable Lead Serial Number [id number]    Implantable Lead Implant Date [id number]    Implantable Lead Location Detail 1 APPENDAGE    Implantable Lead Location [id number]    Implantable Lead Manufacturer MERM    Implantable Lead Model [id number] CapSure Z    Implantable Lead Serial Number [id number]    Implantable Lead Implant Date [id number]    Implantable Lead Location Detail 1 APEX    Implantable Lead Location [phone]    Lead Channel Setting Sensing Sensitivity 4.0 mV   Lead Channel Setting Sensing Adaptation Mode Fixed Pacing    Lead Channel Setting Pacing Pulse Width 0.4 ms   Lead Channel Setting Pacing Amplitude 2.5 V   Lead Channel Impedance Value 398 ohm   Lead Channel Impedance Value 901 ohm   Lead Channel Pacing Threshold Amplitude 0.9 V   Lead Channel Pacing Threshold Pulse Width 0.4 ms   Battery Status BOS    Battery Remaining Longevity 138 mo   Battery Remaining Percentage 100 %   Brady Statistic RA Percent Paced 0 %   Brady Statistic RV Percent Paced 95 %       Pertinent labs & imaging results that were available during my care of the patient were reviewed by me and considered in my medical decision making.  Assessment & Plan:  [place] was seen today for insect bite and wound infection.  Diagnoses and all orders for this visit:  Cellulitis of right foot Discoloration of skin of toe Consulted with Lisa with limb preservation unit and Dr. Sharol Given. Appointment made for pt to see Dr. Sharol Given tomorrow and referral placed to peripheral vascular navigator. Doxycycline added to regimen. Wound culture obtained. Wound cleaned and wrapped in office.  Dr. Lajuana Ripple in to see pt and agrees with treatment plan. Pt will be seeing ortho in the morning and can obtain further imaging if warranted.  -     Anaerobic and Aerobic Culture -     doxycycline (VIBRA-TABS) 100 MG tablet; Take 1 tablet (100 mg total) by mouth  2 (two) times daily for 10 days. 1 po bid -     AMB referral to  wound care center -     Ambulatory referral to Orthopedic Surgery -     AMB Referral to Peripheral Vascular Navigator   Continue all other maintenance medications.  Follow up plan: Return in about 5 days (around 02/19/2021), or if symptoms worsen or fail to improve.   Continue healthy lifestyle choices, including diet (rich in fruits, vegetables, and lean proteins, and low in salt and simple carbohydrates) and exercise (at least 30 minutes of moderate physical activity daily).  Educational handout given for wound care  The above assessment and management plan was discussed with the patient. The patient verbalized understanding of and has agreed to the management plan. Patient is aware to call the clinic if they develop any new symptoms or if symptoms persist or worsen. Patient is aware when to return to the clinic for a follow-up visit. Patient educated on when it is appropriate to go to the emergency department.   Monia Pouch, FNP-C [facility] [phone]

## 2021-02-15 ENCOUNTER — Ambulatory Visit (INDEPENDENT_AMBULATORY_CARE_PROVIDER_SITE_OTHER): Payer: Medicare HMO | Admitting: Orthopedic Surgery

## 2021-02-15 DIAGNOSIS — L089 Local infection of the skin and subcutaneous tissue, unspecified: Secondary | ICD-10-CM | POA: Diagnosis not present

## 2021-02-15 DIAGNOSIS — L02611 Cutaneous abscess of right foot: Secondary | ICD-10-CM

## 2021-02-15 MED ORDER — SILVER SULFADIAZINE 1 % EX CREA: 1.0000 "application " | TOPICAL_CREAM | Freq: Every day | CUTANEOUS | 3 refills | Status: AC

## 2021-02-15 NOTE — Progress Notes (Signed)
Remote pacemaker transmission.   

## 2021-02-16 ENCOUNTER — Encounter: Payer: Self-pay | Admitting: Orthopedic Surgery

## 2021-02-16 NOTE — Progress Notes (Signed)
Office Visit Note   Patient: [name] [name]           Date of Birth: 1924-04-02           MRN: [id number] Visit Date: 02/15/2021              Requested by: Janora Norlander, DO [place],  [place] [address] PCP: Janora Norlander, DO  Chief Complaint  Patient presents with   Right Foot - Pain      HPI: Patient is a 87 year old woman who presents with a large abscess dorsum of the left foot.  Patient states that this started 4 weeks ago when she was walking barefoot in the yard and stepped on a yellowjacket nest.  She went to the emergency room she was started on amoxicillin that she finished yesterday.  She states she just started doxycycline.  She states the foot is cold red with a large blister over the dorsum of the foot.  She is currently on Eliquis for A. fib she has a pacemaker hypertension and high cholesterol.  Assessment & Plan: Visit Diagnoses:  1. Right foot infection   2. Cutaneous abscess of right foot     Plan: Ulcer was debrided back to healthy viable tissue.  Start Silvadene dressing changes daily with Dial soap cleansing.  Follow-Up Instructions: No follow-ups on file.   Ortho Exam patient has a palpable dorsalis pedis pulse and a Doppler shows  triphasic flow.  She has a large ulcer over the dorsum of her foot with the blistered area 4 x 6 cm.  After informed consent a 10 blade knife was used after sterile prepping with Betadine and anesthetizing the area with 1 cc of 1% lidocaine plain the large blister area was debrided of skin and soft tissue with a 10 blade knife.  This was debrided back to healthy viable tissue the ulcer bed is 4 x 6 cm after debridement.  A culture was obtained of the fluid and a sterile dressing was applied.  Patient is alert, oriented, no adenopathy, well-dressed, normal affect, normal respiratory effort. Examination patient has a palpable dorsalis pedis pulse the Doppler was used and she  Imaging: No results found. No  images are attached to the encounter.  Labs: Lab Results  Component Value Date   HGBA1C 5.6 11/28/2020   REPTSTATUS 02/10/2021 FINAL 02/06/2021   REPTSTATUS 02/06/2021 FINAL 02/06/2021   GRAMSTAIN  02/06/2021    ABUNDANT WBC PRESENT, PREDOMINANTLY PMN NO ORGANISMS SEEN Performed at [facility], [place] [address], [place], [place] [address]    GRAMSTAIN  02/06/2021    WBC PRESENT,BOTH PMN AND MONONUCLEAR NO ORGANISMS SEEN CYTOSPIN SMEAR Performed at [facility], [address] [place], [place] [address]    CULT  02/06/2021    RARE LECLERCIA ADECARBOXYLATA RARE PSEUDOMONAS PUTIDA    LABORGA LECLERCIA ADECARBOXYLATA 02/06/2021   LABORGA PSEUDOMONAS PUTIDA 02/06/2021     Lab Results  Component Value Date   ALBUMIN 3.8 02/06/2021   ALBUMIN 4.3 11/28/2020   ALBUMIN 4.3 05/13/2019    No results found for: MG Lab Results  Component Value Date   VD25OH 128.0 (H) 11/28/2020   VD25OH 25.2 (L) 05/13/2019   VD25OH 37.5 05/16/2016    No results found for: PREALBUMIN CBC EXTENDED Latest Ref Rng & Units 02/06/2021 11/28/2020 05/13/2019  WBC 4.0 - 10.5 K/uL 7.8 7.4 9.7  RBC 3.87 - 5.11 MIL/uL 4.80 4.91 4.78  HGB 12.0 - 15.0 g/dL 16.7(H)  15.7 15.6  HCT 36.0 - 46.0 % 47.9(H) 45.5 45.6  PLT 150 - 400 K/uL 128(L) 138(L) 211  NEUTROABS 1.7 - 7.7 K/uL 6.3 - -  LYMPHSABS 0.7 - 4.0 K/uL 1.0 - -     There is no height or weight on file to calculate BMI.  Orders:  Orders Placed This Encounter  Procedures   Wound culture   Meds ordered this encounter  Medications   silver sulfADIAZINE (SILVADENE) 1 % cream    Sig: Apply 1 application topically daily. Apply to affected area daily plus dry dressing    Dispense:  400 g    Refill:  3     Procedures: No procedures performed  Clinical Data: No additional findings.  ROS:  All other systems negative, except as noted in the HPI. Review of Systems  Objective: Vital Signs: There were no vitals taken for this  visit.  Specialty Comments:  No specialty comments available.  PMFS History: Patient Active Problem List   Diagnosis Date Noted   Mild dementia 12/20/2020   Epidermal cyst 11/03/2017   Hypothyroidism 06/09/2017   Vitamin D deficiency 05/16/2016   Atrial fibrillation (HCC) 01/06/2015   Paroxysmal atrial fibrillation (HCC) 04/01/2014   AV BLOCK, COMPLETE 04/25/2010   HLD (hyperlipidemia) 10/30/2008   HYPERTENSION, BENIGN 10/30/2008   PACEMAKER, PERMANENT 10/30/2008   Past Medical History:  Diagnosis Date   Atrioventricular block, complete (HCC) 2006   Hyperlipidemia    Hypertension    Paroxysmal atrial fibrillation (HCC) 2016   single episode detected on PPM interrogation    Family History  Problem Relation Age of Onset   Cancer Sister        breast    Past Surgical History:  Procedure Laterality Date   ABDOMINAL HYSTERECTOMY     EYE SURGERY Bilateral    cataracts   GALLBLADDER SURGERY     Normal coronary angiography  1999   PPM GENERATOR CHANGEOUT N/A 01/15/2017   [employer] Accolade MRI EL DL pacemaker for CHB by Dr Johney Frame   PULSE GENERATOR IMPLANT     Guidant Ultra dual-mode, dual-pacing, dual-sensing generator for complete atrioventricular block 2006 with some noise on atrial lead and generator recall for very low incidence of loss of output   Social History   Occupational History   Not on file  Tobacco Use   Smoking status: Never   Smokeless tobacco: Never  Substance and Sexual Activity   Alcohol use: No    Alcohol/week: 0.0 standard drinks   Drug use: No   Sexual activity: Not on file

## 2021-02-18 LAB — WOUND CULTURE
MICRO NUMBER:: 12651688
SPECIMEN QUALITY:: ADEQUATE

## 2021-02-19 ENCOUNTER — Other Ambulatory Visit: Payer: Self-pay

## 2021-02-19 MED ORDER — CIPROFLOXACIN HCL 500 MG PO TABS: 500.0000 mg | ORAL_TABLET | Freq: Two times a day (BID) | ORAL | 0 refills | Status: DC

## 2021-02-20 ENCOUNTER — Emergency Department (HOSPITAL_COMMUNITY): Payer: Medicare HMO

## 2021-02-20 ENCOUNTER — Encounter (HOSPITAL_COMMUNITY): Payer: Medicare HMO

## 2021-02-20 ENCOUNTER — Other Ambulatory Visit: Payer: Self-pay

## 2021-02-20 ENCOUNTER — Encounter (HOSPITAL_COMMUNITY): Payer: Self-pay

## 2021-02-20 ENCOUNTER — Ambulatory Visit (INDEPENDENT_AMBULATORY_CARE_PROVIDER_SITE_OTHER): Payer: Medicare HMO | Admitting: Orthopedic Surgery

## 2021-02-20 ENCOUNTER — Ambulatory Visit: Payer: Medicare HMO

## 2021-02-20 ENCOUNTER — Encounter: Payer: Self-pay | Admitting: Orthopedic Surgery

## 2021-02-20 ENCOUNTER — Inpatient Hospital Stay (HOSPITAL_COMMUNITY)
Admission: EM | Admit: 2021-02-20 | Discharge: 2021-02-28 | DRG: 871 | Disposition: A | Discharge status: Hospice | Payer: Medicare HMO | Attending: Internal Medicine | Admitting: Internal Medicine

## 2021-02-20 ENCOUNTER — Ambulatory Visit: Payer: Medicare HMO | Admitting: Family Medicine

## 2021-02-20 ENCOUNTER — Inpatient Hospital Stay (HOSPITAL_COMMUNITY): Payer: Medicare HMO

## 2021-02-20 VITALS — BP 120/60 | Temp 97.7°F

## 2021-02-20 DIAGNOSIS — R627 Adult failure to thrive: Secondary | ICD-10-CM | POA: Diagnosis present

## 2021-02-20 DIAGNOSIS — D696 Thrombocytopenia, unspecified: Secondary | ICD-10-CM | POA: Diagnosis not present

## 2021-02-20 DIAGNOSIS — R638 Other symptoms and signs concerning food and fluid intake: Secondary | ICD-10-CM | POA: Diagnosis not present

## 2021-02-20 DIAGNOSIS — L02612 Cutaneous abscess of left foot: Secondary | ICD-10-CM | POA: Diagnosis present

## 2021-02-20 DIAGNOSIS — Z66 Do not resuscitate: Secondary | ICD-10-CM | POA: Diagnosis not present

## 2021-02-20 DIAGNOSIS — Z711 Person with feared health complaint in whom no diagnosis is made: Secondary | ICD-10-CM | POA: Diagnosis not present

## 2021-02-20 DIAGNOSIS — T68XXXA Hypothermia, initial encounter: Secondary | ICD-10-CM | POA: Diagnosis not present

## 2021-02-20 DIAGNOSIS — Z515 Encounter for palliative care: Secondary | ICD-10-CM

## 2021-02-20 DIAGNOSIS — G301 Alzheimer's disease with late onset: Secondary | ICD-10-CM | POA: Diagnosis not present

## 2021-02-20 DIAGNOSIS — T1490XA Injury, unspecified, initial encounter: Secondary | ICD-10-CM | POA: Diagnosis not present

## 2021-02-20 DIAGNOSIS — R64 Cachexia: Secondary | ICD-10-CM | POA: Diagnosis present

## 2021-02-20 DIAGNOSIS — E43 Unspecified severe protein-calorie malnutrition: Secondary | ICD-10-CM | POA: Diagnosis not present

## 2021-02-20 DIAGNOSIS — L02611 Cutaneous abscess of right foot: Secondary | ICD-10-CM

## 2021-02-20 DIAGNOSIS — Z7901 Long term (current) use of anticoagulants: Secondary | ICD-10-CM

## 2021-02-20 DIAGNOSIS — B965 Pseudomonas (aeruginosa) (mallei) (pseudomallei) as the cause of diseases classified elsewhere: Secondary | ICD-10-CM | POA: Diagnosis present

## 2021-02-20 DIAGNOSIS — M25552 Pain in left hip: Secondary | ICD-10-CM | POA: Diagnosis present

## 2021-02-20 DIAGNOSIS — Z7189 Other specified counseling: Secondary | ICD-10-CM | POA: Diagnosis not present

## 2021-02-20 DIAGNOSIS — I495 Sick sinus syndrome: Secondary | ICD-10-CM | POA: Diagnosis present

## 2021-02-20 DIAGNOSIS — I330 Acute and subacute infective endocarditis: Secondary | ICD-10-CM | POA: Diagnosis not present

## 2021-02-20 DIAGNOSIS — L97522 Non-pressure chronic ulcer of other part of left foot with fat layer exposed: Secondary | ICD-10-CM | POA: Diagnosis not present

## 2021-02-20 DIAGNOSIS — T827XXS Infection and inflammatory reaction due to other cardiac and vascular devices, implants and grafts, sequela: Secondary | ICD-10-CM | POA: Diagnosis not present

## 2021-02-20 DIAGNOSIS — T827XXA Infection and inflammatory reaction due to other cardiac and vascular devices, implants and grafts, initial encounter: Secondary | ICD-10-CM | POA: Diagnosis not present

## 2021-02-20 DIAGNOSIS — E785 Hyperlipidemia, unspecified: Secondary | ICD-10-CM | POA: Diagnosis present

## 2021-02-20 DIAGNOSIS — R296 Repeated falls: Secondary | ICD-10-CM

## 2021-02-20 DIAGNOSIS — R54 Age-related physical debility: Secondary | ICD-10-CM | POA: Diagnosis present

## 2021-02-20 DIAGNOSIS — Y831 Surgical operation with implant of artificial internal device as the cause of abnormal reaction of the patient, or of later complication, without mention of misadventure at the time of the procedure: Secondary | ICD-10-CM | POA: Diagnosis present

## 2021-02-20 DIAGNOSIS — Z20822 Contact with and (suspected) exposure to covid-19: Secondary | ICD-10-CM | POA: Diagnosis not present

## 2021-02-20 DIAGNOSIS — A498 Other bacterial infections of unspecified site: Secondary | ICD-10-CM | POA: Diagnosis not present

## 2021-02-20 DIAGNOSIS — Z79899 Other long term (current) drug therapy: Secondary | ICD-10-CM

## 2021-02-20 DIAGNOSIS — I442 Atrioventricular block, complete: Secondary | ICD-10-CM | POA: Diagnosis present

## 2021-02-20 DIAGNOSIS — R7989 Other specified abnormal findings of blood chemistry: Secondary | ICD-10-CM | POA: Diagnosis not present

## 2021-02-20 DIAGNOSIS — R748 Abnormal levels of other serum enzymes: Secondary | ICD-10-CM | POA: Diagnosis not present

## 2021-02-20 DIAGNOSIS — J90 Pleural effusion, not elsewhere classified: Secondary | ICD-10-CM | POA: Diagnosis not present

## 2021-02-20 DIAGNOSIS — N179 Acute kidney failure, unspecified: Secondary | ICD-10-CM | POA: Diagnosis present

## 2021-02-20 DIAGNOSIS — F0280 Dementia in other diseases classified elsewhere without behavioral disturbance: Secondary | ICD-10-CM | POA: Diagnosis present

## 2021-02-20 DIAGNOSIS — S0990XA Unspecified injury of head, initial encounter: Secondary | ICD-10-CM | POA: Diagnosis not present

## 2021-02-20 DIAGNOSIS — D62 Acute posthemorrhagic anemia: Secondary | ICD-10-CM | POA: Diagnosis not present

## 2021-02-20 DIAGNOSIS — Z950 Presence of cardiac pacemaker: Secondary | ICD-10-CM

## 2021-02-20 DIAGNOSIS — I480 Paroxysmal atrial fibrillation: Secondary | ICD-10-CM | POA: Diagnosis present

## 2021-02-20 DIAGNOSIS — G9341 Metabolic encephalopathy: Secondary | ICD-10-CM | POA: Diagnosis present

## 2021-02-20 DIAGNOSIS — R6521 Severe sepsis with septic shock: Secondary | ICD-10-CM | POA: Diagnosis not present

## 2021-02-20 DIAGNOSIS — I10 Essential (primary) hypertension: Secondary | ICD-10-CM | POA: Diagnosis not present

## 2021-02-20 DIAGNOSIS — I5021 Acute systolic (congestive) heart failure: Secondary | ICD-10-CM | POA: Diagnosis not present

## 2021-02-20 DIAGNOSIS — M6282 Rhabdomyolysis: Secondary | ICD-10-CM | POA: Diagnosis present

## 2021-02-20 DIAGNOSIS — R209 Unspecified disturbances of skin sensation: Secondary | ICD-10-CM | POA: Diagnosis not present

## 2021-02-20 DIAGNOSIS — I5040 Unspecified combined systolic (congestive) and diastolic (congestive) heart failure: Secondary | ICD-10-CM | POA: Diagnosis not present

## 2021-02-20 DIAGNOSIS — Z043 Encounter for examination and observation following other accident: Secondary | ICD-10-CM | POA: Diagnosis not present

## 2021-02-20 DIAGNOSIS — S3993XA Unspecified injury of pelvis, initial encounter: Secondary | ICD-10-CM | POA: Diagnosis not present

## 2021-02-20 DIAGNOSIS — F02C0 Dementia in other diseases classified elsewhere, severe, without behavioral disturbance, psychotic disturbance, mood disturbance, and anxiety: Secondary | ICD-10-CM | POA: Diagnosis not present

## 2021-02-20 DIAGNOSIS — A419 Sepsis, unspecified organism: Secondary | ICD-10-CM | POA: Diagnosis not present

## 2021-02-20 DIAGNOSIS — R69 Illness, unspecified: Secondary | ICD-10-CM | POA: Diagnosis not present

## 2021-02-20 DIAGNOSIS — Z79890 Hormone replacement therapy (postmenopausal): Secondary | ICD-10-CM

## 2021-02-20 DIAGNOSIS — M1611 Unilateral primary osteoarthritis, right hip: Secondary | ICD-10-CM | POA: Diagnosis not present

## 2021-02-20 DIAGNOSIS — T827XXD Infection and inflammatory reaction due to other cardiac and vascular devices, implants and grafts, subsequent encounter: Secondary | ICD-10-CM | POA: Diagnosis not present

## 2021-02-20 DIAGNOSIS — Z681 Body mass index (BMI) 19 or less, adult: Secondary | ICD-10-CM

## 2021-02-20 DIAGNOSIS — R195 Other fecal abnormalities: Secondary | ICD-10-CM | POA: Diagnosis not present

## 2021-02-20 DIAGNOSIS — E860 Dehydration: Secondary | ICD-10-CM | POA: Diagnosis present

## 2021-02-20 DIAGNOSIS — G9340 Encephalopathy, unspecified: Secondary | ICD-10-CM | POA: Diagnosis not present

## 2021-02-20 DIAGNOSIS — R931 Abnormal findings on diagnostic imaging of heart and coronary circulation: Secondary | ICD-10-CM | POA: Diagnosis not present

## 2021-02-20 DIAGNOSIS — Z803 Family history of malignant neoplasm of breast: Secondary | ICD-10-CM

## 2021-02-20 DIAGNOSIS — I110 Hypertensive heart disease with heart failure: Secondary | ICD-10-CM | POA: Diagnosis present

## 2021-02-20 DIAGNOSIS — R6520 Severe sepsis without septic shock: Secondary | ICD-10-CM | POA: Diagnosis not present

## 2021-02-20 DIAGNOSIS — Z90710 Acquired absence of both cervix and uterus: Secondary | ICD-10-CM

## 2021-02-20 DIAGNOSIS — W19XXXA Unspecified fall, initial encounter: Secondary | ICD-10-CM | POA: Diagnosis present

## 2021-02-20 DIAGNOSIS — Z9841 Cataract extraction status, right eye: Secondary | ICD-10-CM

## 2021-02-20 DIAGNOSIS — I872 Venous insufficiency (chronic) (peripheral): Secondary | ICD-10-CM | POA: Diagnosis present

## 2021-02-20 DIAGNOSIS — I517 Cardiomegaly: Secondary | ICD-10-CM | POA: Diagnosis not present

## 2021-02-20 DIAGNOSIS — Z9842 Cataract extraction status, left eye: Secondary | ICD-10-CM

## 2021-02-20 DIAGNOSIS — I739 Peripheral vascular disease, unspecified: Secondary | ICD-10-CM | POA: Diagnosis present

## 2021-02-20 DIAGNOSIS — I38 Endocarditis, valve unspecified: Secondary | ICD-10-CM

## 2021-02-20 DIAGNOSIS — Y92009 Unspecified place in unspecified non-institutional (private) residence as the place of occurrence of the external cause: Secondary | ICD-10-CM | POA: Diagnosis not present

## 2021-02-20 DIAGNOSIS — I339 Acute and subacute endocarditis, unspecified: Secondary | ICD-10-CM | POA: Diagnosis not present

## 2021-02-20 DIAGNOSIS — R7401 Elevation of levels of liver transaminase levels: Secondary | ICD-10-CM | POA: Diagnosis present

## 2021-02-20 DIAGNOSIS — M1612 Unilateral primary osteoarthritis, left hip: Secondary | ICD-10-CM | POA: Diagnosis not present

## 2021-02-20 DIAGNOSIS — R600 Localized edema: Secondary | ICD-10-CM | POA: Diagnosis not present

## 2021-02-20 LAB — CBC WITH DIFFERENTIAL/PLATELET
Abs Immature Granulocytes: 0.09 10*3/uL — ABNORMAL HIGH (ref 0.00–0.07)
Basophils Absolute: 0 10*3/uL (ref 0.0–0.1)
Basophils Relative: 0 %
Eosinophils Absolute: 0 10*3/uL (ref 0.0–0.5)
Eosinophils Relative: 0 %
HCT: 50.9 % — ABNORMAL HIGH (ref 36.0–46.0)
Hemoglobin: 17.1 g/dL — ABNORMAL HIGH (ref 12.0–15.0)
Immature Granulocytes: 1 %
Lymphocytes Relative: 4 %
Lymphs Abs: 0.7 10*3/uL (ref 0.7–4.0)
MCH: 33.9 pg (ref 26.0–34.0)
MCHC: 33.6 g/dL (ref 30.0–36.0)
MCV: 100.8 fL — ABNORMAL HIGH (ref 80.0–100.0)
Monocytes Absolute: 0.8 10*3/uL (ref 0.1–1.0)
Monocytes Relative: 5 %
Neutro Abs: 14.3 10*3/uL — ABNORMAL HIGH (ref 1.7–7.7)
Neutrophils Relative %: 90 %
Platelets: 106 10*3/uL — ABNORMAL LOW (ref 150–400)
RBC: 5.05 MIL/uL (ref 3.87–5.11)
RDW: 17.6 % — ABNORMAL HIGH (ref 11.5–15.5)
Smear Review: DECREASED
WBC: 15.9 10*3/uL — ABNORMAL HIGH (ref 4.0–10.5)
nRBC: 0 % (ref 0.0–0.2)

## 2021-02-20 LAB — I-STAT CHEM 8, ED
BUN: 38 mg/dL — ABNORMAL HIGH (ref 8–23)
Calcium, Ion: 1 mmol/L — ABNORMAL LOW (ref 1.15–1.40)
Chloride: 108 mmol/L (ref 98–111)
Creatinine, Ser: 0.9 mg/dL (ref 0.44–1.00)
Glucose, Bld: 96 mg/dL (ref 70–99)
HCT: 53 % — ABNORMAL HIGH (ref 36.0–46.0)
Hemoglobin: 18 g/dL — ABNORMAL HIGH (ref 12.0–15.0)
Potassium: 4.4 mmol/L (ref 3.5–5.1)
Sodium: 134 mmol/L — ABNORMAL LOW (ref 135–145)
TCO2: 18 mmol/L — ABNORMAL LOW (ref 22–32)

## 2021-02-20 LAB — LACTIC ACID, PLASMA
Lactic Acid, Venous: 4.6 mmol/L (ref 0.5–1.9)
Lactic Acid, Venous: 3.4 mmol/L (ref 0.5–1.9)

## 2021-02-20 LAB — CK: Total CK: 977 U/L — ABNORMAL HIGH (ref 38–234)

## 2021-02-20 LAB — COMPREHENSIVE METABOLIC PANEL
ALT: 58 U/L — ABNORMAL HIGH (ref 0–44)
AST: 88 U/L — ABNORMAL HIGH (ref 15–41)
Albumin: 3.3 g/dL — ABNORMAL LOW (ref 3.5–5.0)
Alkaline Phosphatase: 101 U/L (ref 38–126)
Anion gap: 19 — ABNORMAL HIGH (ref 5–15)
BUN: 35 mg/dL — ABNORMAL HIGH (ref 8–23)
CO2: 13 mmol/L — ABNORMAL LOW (ref 22–32)
Calcium: 10 mg/dL (ref 8.9–10.3)
Chloride: 104 mmol/L (ref 98–111)
Creatinine, Ser: 1.2 mg/dL — ABNORMAL HIGH (ref 0.44–1.00)
GFR, Estimated: 42 mL/min — ABNORMAL LOW (ref >60)
Glucose, Bld: 96 mg/dL (ref 70–99)
Potassium: 4.5 mmol/L (ref 3.5–5.1)
Sodium: 136 mmol/L (ref 135–145)
Total Bilirubin: 1.7 mg/dL — ABNORMAL HIGH (ref 0.3–1.2)
Total Protein: 6.3 g/dL — ABNORMAL LOW (ref 6.5–8.1)

## 2021-02-20 LAB — APTT: aPTT: 62 seconds — ABNORMAL HIGH (ref 24–36)

## 2021-02-20 LAB — RESP PANEL BY RT-PCR (FLU A&B, COVID) ARPGX2
Influenza A by PCR: NEGATIVE
Influenza B by PCR: NEGATIVE
SARS Coronavirus 2 by RT PCR: NEGATIVE

## 2021-02-20 LAB — PROTIME-INR
INR: 1.4 — ABNORMAL HIGH (ref 0.8–1.2)
INR: 1.4 — ABNORMAL HIGH (ref 0.8–1.2)
Prothrombin Time: 17.5 seconds — ABNORMAL HIGH (ref 11.4–15.2)
Prothrombin Time: 17.4 seconds — ABNORMAL HIGH (ref 11.4–15.2)

## 2021-02-20 LAB — HEPARIN LEVEL (UNFRACTIONATED): Heparin Unfractionated: 0.1 IU/mL — ABNORMAL LOW (ref 0.30–0.70)

## 2021-02-20 LAB — ETHANOL: Alcohol, Ethyl (B): <10 mg/dL (ref <10)

## 2021-02-20 LAB — TSH: TSH: 6.505 u[IU]/mL — ABNORMAL HIGH (ref 0.350–4.500)

## 2021-02-20 LAB — TROPONIN I (HIGH SENSITIVITY): Troponin I (High Sensitivity): 33 ng/L — ABNORMAL HIGH (ref <18)

## 2021-02-20 MED ORDER — SODIUM CHLORIDE 0.9 % IV SOLN: INTRAVENOUS | Status: DC | PRN

## 2021-02-20 MED ORDER — HEPARIN (PORCINE) 25000 UT/250ML-% IV SOLN
700.0000 [IU]/h | INTRAVENOUS | Status: DC
  Administered 2021-02-20: 650 [IU]/h via INTRAVENOUS
  Administered 2021-02-22: 750 [IU]/h via INTRAVENOUS
  Filled 2021-02-20 (×3): qty 250

## 2021-02-20 MED ORDER — LEVOTHYROXINE SODIUM 75 MCG PO TABS
75.0000 ug | ORAL_TABLET | Freq: Every day | ORAL | Status: DC
  Administered 2021-02-21 – 2021-02-28 (×8): 75 ug via ORAL
  Filled 2021-02-20 (×8): qty 1

## 2021-02-20 MED ORDER — METOPROLOL TARTRATE 5 MG/5ML IV SOLN: 2.5000 mg | Freq: Four times a day (QID) | INTRAVENOUS | Status: DC | PRN

## 2021-02-20 MED ORDER — HYDROMORPHONE HCL 1 MG/ML IJ SOLN: 0.5000 mg | INTRAMUSCULAR | Status: DC | PRN

## 2021-02-20 MED ORDER — LACTATED RINGERS IV BOLUS
500.0000 mL | Freq: Once | INTRAVENOUS | Status: AC
  Administered 2021-02-20: 500 mL via INTRAVENOUS

## 2021-02-20 MED ORDER — PIPERACILLIN-TAZOBACTAM 3.375 G IVPB 30 MIN
3.3750 g | Freq: Once | INTRAVENOUS | Status: AC
  Administered 2021-02-20: 3.375 g via INTRAVENOUS
  Filled 2021-02-20: qty 50

## 2021-02-20 MED ORDER — SILVER SULFADIAZINE 1 % EX CREA
TOPICAL_CREAM | CUTANEOUS | Status: AC
  Filled 2021-02-20: qty 85

## 2021-02-20 MED ORDER — VANCOMYCIN HCL 750 MG/150ML IV SOLN
750.0000 mg | INTRAVENOUS | Status: DC
  Administered 2021-02-22: 750 mg via INTRAVENOUS
  Filled 2021-02-20: qty 150

## 2021-02-20 MED ORDER — ATORVASTATIN CALCIUM 10 MG PO TABS: 10.0000 mg | ORAL_TABLET | Freq: Every evening | ORAL | Status: DC

## 2021-02-20 MED ORDER — ONDANSETRON HCL 4 MG/2ML IJ SOLN: 4.0000 mg | Freq: Four times a day (QID) | INTRAMUSCULAR | Status: DC | PRN

## 2021-02-20 MED ORDER — SODIUM CHLORIDE 0.9 % IV SOLN
2.0000 g | Freq: Two times a day (BID) | INTRAVENOUS | Status: DC
  Administered 2021-02-20 – 2021-02-21 (×2): 2 g via INTRAVENOUS
  Filled 2021-02-20 (×2): qty 2

## 2021-02-20 MED ORDER — ONDANSETRON HCL 4 MG PO TABS: 4.0000 mg | ORAL_TABLET | Freq: Four times a day (QID) | ORAL | Status: DC | PRN

## 2021-02-20 MED ORDER — ACETAMINOPHEN 325 MG PO TABS
650.0000 mg | ORAL_TABLET | Freq: Four times a day (QID) | ORAL | Status: DC | PRN
  Administered 2021-02-20 – 2021-02-21 (×2): 650 mg via ORAL
  Filled 2021-02-20 (×2): qty 2

## 2021-02-20 MED ORDER — ALBUMIN HUMAN 25 % IV SOLN
25.0000 g | Freq: Four times a day (QID) | INTRAVENOUS | Status: AC
  Administered 2021-02-20 – 2021-02-21 (×4): 25 g via INTRAVENOUS
  Filled 2021-02-20 (×6): qty 100

## 2021-02-20 MED ORDER — ACETAMINOPHEN 650 MG RE SUPP: 650.0000 mg | Freq: Four times a day (QID) | RECTAL | Status: DC | PRN

## 2021-02-20 MED ORDER — VANCOMYCIN HCL 1000 MG/200ML IV SOLN
1000.0000 mg | Freq: Once | INTRAVENOUS | Status: AC
  Administered 2021-02-20: 1000 mg via INTRAVENOUS
  Filled 2021-02-20: qty 200

## 2021-02-20 MED ORDER — FUROSEMIDE 10 MG/ML IJ SOLN: 20.0000 mg | Freq: Two times a day (BID) | INTRAMUSCULAR | Status: DC

## 2021-02-20 MED ORDER — MEMANTINE HCL 5 MG PO TABS
5.0000 mg | ORAL_TABLET | Freq: Every day | ORAL | Status: DC
  Administered 2021-02-20 – 2021-02-28 (×9): 5 mg via ORAL
  Filled 2021-02-20 (×9): qty 1

## 2021-02-20 MED ORDER — SODIUM CHLORIDE 0.9 % IV BOLUS
1000.0000 mL | Freq: Once | INTRAVENOUS | Status: AC
  Administered 2021-02-20: 1000 mL via INTRAVENOUS

## 2021-02-20 NOTE — ED Notes (Signed)
Patient transported to CT 

## 2021-02-20 NOTE — Progress Notes (Signed)
Pharmacy Antibiotic Note  [name] [name] is a 87 y.o. female admitted on 02/20/2021 presenting s/p fall, concern for sepsis. Zosyn 3.375g x 1 currently running in room. Pharmacy has been consulted for vancomycin and cefepime dosing.   Goal AUC: 400-550  Plan: Cefepime 2g q12h  Vancomycin 750 mg IV q 48h (eAUC 450, SCr used 1) Monitor renal function, Cx and clinical progression to narrow Vancomycin levels as needed  Height: 5\' 1"  (154.9 cm) Weight: 46 kg (101 lb 6.4 oz) IBW/kg (Calculated) : 47.8  Temp (24hrs), Avg:95.4 F (35.2 C), Min:93.1 F (33.9 C), Max:97.7 F (36.5 C)  Recent Labs  Lab 02/20/21 0947 02/20/21 1021 02/20/21 1025 02/20/21 1157  WBC 15.9*  --   --   --   CREATININE 1.20* 0.90  --   --   LATICACIDVEN  --   --  4.6* 3.4*     Estimated Creatinine Clearance: 27.8 mL/min (by C-G formula based on SCr of 0.9 mg/dL).    No Known Allergies  Thank you for allowing pharmacy to participate in this patient's care.  02/22/21, PharmD PGY1 Acute Care Resident  02/20/2021,1:29 PM

## 2021-02-20 NOTE — H&P (Signed)
History and Physical    [name] [name] [id number] DOB: December 22, 1924 DOA: 02/20/2021  PCP: Janora Norlander, DO (Confirm with patient/family/NH records and if not entered, this has to be entered at [facility] point of entry) Patient coming from: Home  I have personally briefly reviewed patient's old medical records in [place]  Chief Complaint: Pain  HPI: [name] [name] is a 87 y.o. female with medical history significant of left foot nonhealing wound x2 weeks, PAF on Eliquis, SSS status post PPM, HTN, HLD, dementia, presented with fall at home.  Patient baseline demented, most history provided by patient daughter at bedside.  Daughter reported that patient sustained a open wound of left dorsal foot about 2 weeks ago, then the bone and infected, for which patient completed 7 days of Augmentin and had outpatient debridement 5 days ago with Dr. Sharol Given.  And patient has been taking doxycycline for last 4 days.  Patient was last seen normal on Sunday no complaints.  This morning, patient was found on the floor and complained about left hip pain and unable to get up herself.   Patient was also found to be fluid overload, chest x-ray showed bilateral pleural effusion, his exam showed 2+ bilateral pitting edema, daughter reported the patient's legs "are always this big and puffy".  ED Course: Patient was found hypothermia temperature 93.5, blood pressure borderline, no tachycardia, patient was started on Bair hugger, CT head negative for bleeding rib fracture, rib x-ray inconclusive, blood work WBC 15.9, creatinine 1.2, bicarb 13, lactic acid 4.6.  Patient was started on vancomycin and Zosyn.  Review of Systems: Unable to perform, patient is confused at this point.  Past Medical History:  Diagnosis Date   Atrioventricular block, complete ([place]) 2006   Hyperlipidemia    Hypertension    Paroxysmal atrial fibrillation ([place]) 2016   single episode detected on PPM interrogation    Past Surgical  History:  Procedure Laterality Date   ABDOMINAL HYSTERECTOMY     EYE SURGERY Bilateral    cataracts   GALLBLADDER SURGERY     Normal coronary angiography  1999   PPM GENERATOR CHANGEOUT N/A 01/15/2017   [employer] Accolade MRI EL DL pacemaker for CHB by Dr Rayann Heman   PULSE GENERATOR IMPLANT     Guidant Ultra dual-mode, dual-pacing, dual-sensing generator for complete atrioventricular block 2006 with some noise on atrial lead and generator recall for very low incidence of loss of output     reports that she has never smoked. She has never used smokeless tobacco. She reports that she does not drink alcohol and does not use drugs.  No Known Allergies  Family History  Problem Relation Age of Onset   Cancer Sister        breast     Prior to Admission medications   Medication Sig Start Date End Date Taking? Authorizing Provider  apixaban (ELIQUIS) 5 MG TABS tablet Take 1 tablet (5 mg total) by mouth 2 (two) times daily. (Needs to be seen before next refill) 09/29/20  Yes Ronnie Doss M, DO  atorvastatin (LIPITOR) 10 MG tablet Take 1 tablet (10 mg total) by mouth every evening. 10/30/20  Yes Ronnie Doss M, DO  Cholecalciferol (VITAMIN D3) 1.25 MG (50000 UT) CAPS TAKE 1 CAPSULE (50,000 UNITS TOTAL) BY MOUTH EVERY 7 (SEVEN) DAYS. X12 WEEKS Patient taking differently: Take 50,000 Units by mouth every Friday. 08/02/19  Yes Gottschalk, Ashly M, DO  diltiazem (CARDIZEM CD) 120 MG 24 hr capsule TAKE 1  CAPSULE BY MOUTH EVERY DAY 09/14/20  Yes Gottschalk, Ashly M, DO  dorzolamide-timolol (COSOPT) 22.3-6.8 MG/ML ophthalmic solution Place 1 drop into both eyes 2 (two) times daily. 04/29/19  Yes [provider]  hydrochlorothiazide (HYDRODIURIL) 12.5 MG tablet TAKE 1 TABLET BY MOUTH EVERY DAY 09/14/20  Yes Delynn Flavin M, DO  levothyroxine (SYNTHROID) 75 MCG tablet Take 1 tablet (75 mcg total) by mouth daily before breakfast. 11/29/20  Yes Gottschalk, Ashly M, DO  memantine  (NAMENDA) 5 MG tablet Take 1 tablet (5 mg total) by mouth daily for 30 days, THEN 1 tablet (5 mg total) 2 (two) times daily. 01/10/21 03/11/21 Yes Gottschalk, Kathie Rhodes M, DO  silver sulfADIAZINE (SILVADENE) 1 % cream Apply 1 application topically daily. Apply to affected area daily plus dry dressing Patient taking differently: Apply 1 application topically daily. Apply to left foot daily plus dry dressing 02/15/21  Yes Nadara Mustard, MD  ciprofloxacin (CIPRO) 500 MG tablet Take 1 tablet (500 mg total) by mouth 2 (two) times daily. Twice a day for 2 weeks. Patient not taking: Reported on 02/20/2021 02/19/21   Nadara Mustard, MD  doxycycline (VIBRA-TABS) 100 MG tablet Take 1 tablet (100 mg total) by mouth 2 (two) times daily for 10 days. 1 po bid 02/14/21 02/24/21  Sonny Masters, FNP    Physical Exam: Vitals:   02/20/21 1130 02/20/21 1150 02/20/21 1215 02/20/21 1300  BP: 114/76 129/71 128/63 110/62  Pulse: (!) 33 68 (!) 33 75  Resp: 15 17 17 19   Temp:      TempSrc:      SpO2: 100% 100% 100% 100%  Weight:      Height:        Constitutional: NAD, calm, comfortable Vitals:   02/20/21 1130 02/20/21 1150 02/20/21 1215 02/20/21 1300  BP: 114/76 129/71 128/63 110/62  Pulse: (!) 33 68 (!) 33 75  Resp: 15 17 17 19   Temp:      TempSrc:      SpO2: 100% 100% 100% 100%  Weight:      Height:       Eyes: PERRL, lids and conjunctivae normal ENMT: Mucous membranes are moist. Posterior pharynx clear of any exudate or lesions.Normal dentition.  Neck: normal, supple, no masses, no thyromegaly Respiratory: clear to auscultation bilaterally, no wheezing, fine crackles on bilateral lower fields. Normal respiratory effort. No accessory muscle use.  Cardiovascular: Regular rate and rhythm, no murmurs / rubs / gallops. 2+ extremity edema. 2+ pedal pulses. No carotid bruits.  Abdomen: no tenderness, no masses palpated. No hepatosplenomegaly. Bowel sounds positive.  Musculoskeletal: Left hip tenderness,  decreased ROM Skin: Large ulcer on dorsum of left foot, with 3rd metatarsal shaft exposed Neurologic: CN 2-12 grossly intact. Sensation intact, DTR normal. Strength 5/5 in all 4.  Psychiatric: Normal judgment and insight. Alert and oriented x 3. Normal mood.      Labs on Admission: I have personally reviewed following labs and imaging studies  CBC: Recent Labs  Lab 02/20/21 0947 02/20/21 1021  WBC 15.9*  --   NEUTROABS 14.3*  --   HGB 17.1* 18.0*  HCT 50.9* 53.0*  MCV 100.8*  --   PLT 106*  --    Basic Metabolic Panel: Recent Labs  Lab 02/20/21 0947 02/20/21 1021  NA 136 134*  K 4.5 4.4  CL 104 108  CO2 13*  --   GLUCOSE 96 96  BUN 35* 38*  CREATININE 1.20* 0.90  CALCIUM 10.0  --  GFR: Estimated Creatinine Clearance: 27.8 mL/min (by C-G formula based on SCr of 0.9 mg/dL). Liver Function Tests: Recent Labs  Lab 02/20/21 0947  AST 88*  ALT 58*  ALKPHOS 101  BILITOT 1.7*  PROT 6.3*  ALBUMIN 3.3*   No results for input(s): LIPASE, AMYLASE in the last 168 hours. No results for input(s): AMMONIA in the last 168 hours. Coagulation Profile: Recent Labs  Lab 02/20/21 1147 02/20/21 1229  INR 1.4* 1.4*   Cardiac Enzymes: Recent Labs  Lab 02/20/21 0947  CKTOTAL 977*   BNP (last 3 results) No results for input(s): PROBNP in the last 8760 hours. HbA1C: No results for input(s): HGBA1C in the last 72 hours. CBG: No results for input(s): GLUCAP in the last 168 hours. Lipid Profile: No results for input(s): CHOL, HDL, LDLCALC, TRIG, CHOLHDL, LDLDIRECT in the last 72 hours. Thyroid Function Tests: No results for input(s): TSH, T4TOTAL, FREET4, T3FREE, THYROIDAB in the last 72 hours. Anemia Panel: No results for input(s): VITAMINB12, FOLATE, FERRITIN, TIBC, IRON, RETICCTPCT in the last 72 hours. Urine analysis: No results found for: COLORURINE, APPEARANCEUR, LABSPEC, [place], GLUCOSEU, [place], [place], [place], PROTEINUR, UROBILINOGEN, NITRITE,  LEUKOCYTESUR  Radiological Exams on Admission: CT HEAD WO CONTRAST  Result Date: 02/20/2021 CLINICAL DATA:  Head trauma EXAM: CT HEAD WITHOUT CONTRAST TECHNIQUE: Contiguous axial images were obtained from the base of the skull through the vertex without intravenous contrast. COMPARISON:  None. FINDINGS: Brain: No acute intracranial hemorrhage, mass effect, or herniation. No extra-axial fluid collections. No evidence of acute territorial infarct. No hydrocephalus. Mild cortical volume loss. Patchy hypodensities in the periventricular and subcortical white matter, likely secondary to chronic microvascular ischemic changes. Vascular: No hyperdense vessel or unexpected calcification. Skull: Normal. Negative for fracture or focal lesion. Sinuses/Orbits: No acute process identified. Surgical changes in the globes. Other: None. IMPRESSION: Chronic changes with no acute intracranial process identified. Electronically Signed   By: Ofilia Neas M.D.   On: 02/20/2021 10:59   DG Pelvis Portable  Result Date: 02/20/2021 CLINICAL DATA:  Trauma, fall EXAM: PORTABLE PELVIS 1-2 VIEWS COMPARISON:  None. FINDINGS: No displaced fracture or dislocation is seen. There is a faint linear lucency in the lateral aspect of greater trochanter in the left femur. Degenerative changes are noted in the lower lumbar spine. IMPRESSION: No displaced fracture or dislocation is seen. There is linear lucency seen in the greater trochanter of the proximal left femur which may be an artifact due to confluence of soft tissues over the bony margin or suggest undisplaced fracture. Please correlate with clinical symptoms and physical examination findings. If there are focal symptoms in the area of greater trochanter of proximal left femur, repeat radiographic examination and CT if warranted should be considered. Electronically Signed   By: Elmer Picker M.D.   On: 02/20/2021 10:46   DG Chest Port 1 View  Result Date:  02/20/2021 CLINICAL DATA:  Trauma, sepsis EXAM: PORTABLE CHEST 1 VIEW COMPARISON:  None FINDINGS: Transverse diameter of heart is increased. Central pulmonary vessels are prominent. There are no signs of alveolar pulmonary edema. Small bilateral pleural effusions are seen, more so on the left side. Evaluation of lower lung fields for infiltrates is limited by pleural effusions. There is no pneumothorax. Pacemaker battery is seen in the left infraclavicular region with tips of leads in the right atrium and right ventricle. IMPRESSION: Cardiomegaly. Small bilateral pleural effusions, more so on the left side. There are no signs of alveolar pulmonary edema. Evaluation of lower lung fields for  infiltrates is limited by the effusions. Electronically Signed   By: Elmer Picker M.D.   On: 02/20/2021 10:42    EKG: Independently reviewed. V paced.  Assessment/Plan Principal Problem:   Sepsis ([place])  (please populate well all problems here in Problem List. (For example, if patient is on BP meds at home and you resume or decide to hold them, it is a problem that needs to be her. Same for CAD, COPD, HLD and so on)  Sepsis -Evidenced by hypothermia, new leukocytosis, elevated lactate, signs of endorgan damage of acute metabolic encephalopathy/confusion and AKI, source is left foot deep wound infection, clinically compatible with osteomyelitis with bone exposed. -Outpatient superficial culture shows Pseudomonas on 11/17 -Continue vancomycin and start cefepime -Dr. Sharol Given consulted. -No Hx of PVD, daughter reported that patient baseline active, no complaints of leg pains shortness of breath, but will check ABI. -Given the signs of fluid overload, will switch from crystalloid to colloid for hydration.  Acute probably on chronic CHF decompensation, probably diastolic -Daughter reported patient at baseline active, no complaint of chest pain or shortness of breath with moderate activity. -Check  echocardiogram -Consider Lasix once patient out of sepsis.  AKI -Probably multifactorial from sepsis and CHF decompensation, hydration as above.  Transaminitis -No RUQ tenderness, likely secondary to decompensation of, and or mild rhabdomyolysis -Hydration with albumin, trend CK level.  And trend LFTs tomorrow.  Left hip pain -Pelvic x-ray nonconclusive, will check hip CT scan rule out fractures. -PT evaluation if CT negative for fracture  A. fib, paroxysmal -Blood pressure borderline low, change rate controlled as needed metoprolol hold Cardizem p.o. -Heparin drip for now in [place] patient will go to the OR.  HTN -Hold HCTZ  Mild rhabdomyolysis -Hydration  Moderate protein calorie malnutrition -Consult nutrition  DVT prophylaxis: Heparin drip Code Status: Full code, son will bring in her living will Family Communication: Daughter at bedside Disposition Plan: Expect more than 2 midnight hospital stay Consults called: Dr. Sharol Given Admission status: Tele admit   Lequita Halt MD [facility] Pager [phone]  02/20/2021, 1:10 PM

## 2021-02-20 NOTE — Progress Notes (Addendum)
Pharmacy Antibiotic Note  [name] [name] is a 87 y.o. female admitted on 02/20/2021 presenting s/p fall, concern for sepsis.  Pharmacy has been consulted for vancomycin dosing.  Zosyn per MD  Plan: Vancomycin 1000 mg IV x 1, then 750 mg IV q 48h (eAUC 450, Goal AUC 400-550, SCr used 1) Monitor renal function, Cx and clinical progression to narrow Vancomycin levels as needed  Height: 5\' 1"  (154.9 cm) Weight: 46 kg (101 lb 6.4 oz) IBW/kg (Calculated) : 47.8  Temp (24hrs), Avg:95.4 F (35.2 C), Min:93.1 F (33.9 C), Max:97.7 F (36.5 C)  Recent Labs  Lab 02/20/21 0947 02/20/21 1021 02/20/21 1025  WBC 15.9*  --   --   CREATININE 1.20* 0.90  --   LATICACIDVEN  --   --  4.6*    Estimated Creatinine Clearance: 27.8 mL/min (by C-G formula based on SCr of 0.9 mg/dL).    No Known Allergies  02/22/21, PharmD Clinical Pharmacist ED Pharmacist Phone # [phone] 02/20/2021 12:30 PM

## 2021-02-20 NOTE — Progress Notes (Signed)
ANTICOAGULATION CONSULT NOTE - Initial Consult  Pharmacy Consult for IV heparin Indication: atrial fibrillation  No Known Allergies  Patient Measurements: Height: 5\' 1"  (154.9 cm) Weight: 46 kg (101 lb 6.4 oz) IBW/kg (Calculated) : 47.8 Heparin Dosing Weight: 46 kg  Vital Signs: Temp: 93.1 F (33.9 C) (11/22 1014) Temp Source: Rectal (11/22 1014) BP: 110/62 (11/22 1300) Pulse Rate: 75 (11/22 1300)  Labs: Recent Labs    02/20/21 0947 02/20/21 1021 02/20/21 1147 02/20/21 1229  HGB 17.1* 18.0*  --   --   HCT 50.9* 53.0*  --   --   PLT 106*  --   --   --   LABPROT  --   --  17.5* 17.4*  INR  --   --  1.4* 1.4*  CREATININE 1.20* 0.90  --   --   CKTOTAL 977*  --   --   --     Estimated Creatinine Clearance: 27.8 mL/min (by C-G formula based on SCr of 0.9 mg/dL).   Medical History: Past Medical History:  Diagnosis Date   Atrioventricular block, complete (HCC) 2006   Hyperlipidemia    Hypertension    Paroxysmal atrial fibrillation (HCC) 2016   single episode detected on PPM interrogation    Assessment: 87 yo F with presented on 11/22 with wound on L foot and concern for sepsis. PTA on eliquis for afib. LKD 11/21 at 0900. Pharmacy consulted to start IV heparin.   Goal of Therapy:  Heparin level 0.3-0.7 units/ml aPTT goal: 66-102 Monitor platelets by anticoagulation protocol: Yes   Plan:  Heparin IV 650 units (no bolus) 8 h heparin level and aPTT Daily heparin level, aPTT and CBC  Will trend heparin level and aPTT until correlating    Thank you for allowing pharmacy to participate in this patient's care.  12/21, PharmD PGY1 Acute Care Resident  02/20/2021,1:50 PM

## 2021-02-20 NOTE — ED Notes (Signed)
Temp obtainment delayed d/t unable to obtain temp orally or axillary. Rectal thermometer used to obtain temp. Pt hypothermic. Lawyer initiated, EDP notified.

## 2021-02-20 NOTE — ED Notes (Signed)
Trauma Response Nurse Note-  Reason for Call / Reason for Trauma activation:   - L2 fall on thinners  Initial Focused Assessment (If applicable, or please see trauma documentation):  - GCS 14 - sloughed wound to L foot - Swelling / weeping to bilateral legs - Scab to nose - VS WDL other than temp - rectal temp of 93.1. - Pacemaker noted to L chest Conservation officer, historic buildings)  Interventions:  - Bear hugger appled - L AC PIV started  - Trauma labs / BC drawn - CT head - CXR / pelvic XR - WOC consulted - Dr. Lajoyce Corners consulted - Sepsis workup  Plan of Care as of this note:  - Admit to med tele - WOC to follow  Event Summary:   - Pt has been living alone and was scheduled to go to Dr. Audrie Lia office today to assess her legs / feet.  Prior to her doctor's visit, she fell presumably x2, striking her head.  Pt has been a little "off" per family.  Dr. Audrie Lia office referred her to come to the ED.  She came in as a L2 fall due to being on eliquis and striking her head.  Rectal temp 93.1 -- bear hugger applied and sepsis workup underway.  Will admit to med tele per hospitalist.  WOC RN to follow wounds.  [place], [place] [phone]

## 2021-02-20 NOTE — ED Notes (Signed)
Called lab to check on status of blood cultures and lactic acid. Verifying that labs were received in. Per lab, cultures and lactic just arrived through tube station. Samples were obtained at 1025 and sent approximately 1027. Lab receiving in samples at this time.

## 2021-02-20 NOTE — Progress Notes (Signed)
Office Visit Note   Patient: [name] [name]           Date of Birth: 1925/03/19           MRN: [id number] Visit Date: 02/20/2021              Requested by: Raliegh Ip, DO [address] [place],  [place] [address] PCP: Raliegh Ip, DO  Chief Complaint  Patient presents with   Right Foot - Follow-up      HPI: Patient is a 87 year old woman who is status post a traumatic blister dorsum of the left foot.  Patient was initially started on doxycycline empirically cultures came back yesterday and a prescription was called in for Cipro yesterday.   Patient has had 2 falls today patient complains of being very cold her extremities are cold she has been using Silvadene dressing changes to the left foot.  Patient complains of weakness.  Assessment & Plan: Visit Diagnoses:  1. Cutaneous abscess of right foot     Plan: Patient is having systemic symptoms possibly from heart failure or other causes.  Have recommended family go to the [facility] emergency room.  They live in [place] and anticipate they will go to the [facility] emergency room.  They will notify the ER of the culture sensitivity to Cipro.  Follow-Up Instructions: Return in about 2 weeks (around 03/06/2021).   Ortho Exam  Patient is alert, oriented, no adenopathy, well-dressed, normal affect, normal respiratory effort. Examination patient is cold and shaking and unstable with sitting.  Examination of both legs her pants and slippers are soaking wet from clear fluid draining from her legs.  There is several blisters on the right lower extremity.  There is pitting edema of both lower extremities.  There is ischemic changes to the dorsal left foot wound.  There is no ascending cellulitis no clinical signs of an infection.  Doppler studies on last examination she had triphasic flow.  Both lower extremities and upper extremities are ice cold to the touch.  Imaging: No results found.   Labs: Lab Results   Component Value Date   HGBA1C 5.6 11/28/2020   REPTSTATUS 02/10/2021 FINAL 02/06/2021   REPTSTATUS 02/06/2021 FINAL 02/06/2021   GRAMSTAIN  02/06/2021    ABUNDANT WBC PRESENT, PREDOMINANTLY PMN NO ORGANISMS SEEN Performed at [facility], [address] [place], [place] [address]    GRAMSTAIN  02/06/2021    WBC PRESENT,BOTH PMN AND MONONUCLEAR NO ORGANISMS SEEN CYTOSPIN SMEAR Performed at [facility], [address], [place], [place] [address]    CULT  02/06/2021    RARE LECLERCIA ADECARBOXYLATA RARE PSEUDOMONAS PUTIDA    LABORGA LECLERCIA ADECARBOXYLATA 02/06/2021   LABORGA PSEUDOMONAS PUTIDA 02/06/2021     Lab Results  Component Value Date   ALBUMIN 3.8 02/06/2021   ALBUMIN 4.3 11/28/2020   ALBUMIN 4.3 05/13/2019    No results found for: MG Lab Results  Component Value Date   VD25OH 128.0 (H) 11/28/2020   VD25OH 25.2 (L) 05/13/2019   VD25OH 37.5 05/16/2016    No results found for: PREALBUMIN CBC EXTENDED Latest Ref Rng & Units 02/06/2021 11/28/2020 05/13/2019  WBC 4.0 - 10.5 K/uL 7.8 7.4 9.7  RBC 3.87 - 5.11 MIL/uL 4.80 4.91 4.78  HGB 12.0 - 15.0 g/dL 16.7(H) 15.7 15.6  HCT 36.0 - 46.0 % 47.9(H) 45.5 45.6  PLT 150 - 400 K/uL 128(L) 138(L) 211  NEUTROABS 1.7 - 7.7 K/uL 6.3 - -  LYMPHSABS 0.7 - 4.0 K/uL 1.0 - -     There is no height or weight on file to calculate BMI.  Orders:  No orders of the defined types were placed in this encounter.  No orders of the defined types were placed in this encounter.    Procedures: No procedures performed  Clinical Data: No additional findings.  ROS:  All other systems negative, except as noted in the HPI. Review of Systems  Objective: Vital Signs: BP 120/60   Temp 97.7 F (36.5 C)   Specialty Comments:  No specialty comments available.  PMFS History: Patient Active Problem List   Diagnosis Date Noted   Mild dementia 12/20/2020   Epidermal cyst 11/03/2017   Hypothyroidism 06/09/2017   Vitamin  D deficiency 05/16/2016   Atrial fibrillation ([place]) 01/06/2015   Paroxysmal atrial fibrillation ([place]) 04/01/2014   AV BLOCK, COMPLETE 04/25/2010   HLD (hyperlipidemia) 10/30/2008   HYPERTENSION, BENIGN 10/30/2008   PACEMAKER, PERMANENT 10/30/2008   Past Medical History:  Diagnosis Date   Atrioventricular block, complete ([place]) 2006   Hyperlipidemia    Hypertension    Paroxysmal atrial fibrillation ([place]) 2016   single episode detected on PPM interrogation    Family History  Problem Relation Age of Onset   Cancer Sister        breast    Past Surgical History:  Procedure Laterality Date   ABDOMINAL HYSTERECTOMY     EYE SURGERY Bilateral    cataracts   GALLBLADDER SURGERY     Normal coronary angiography  1999   PPM GENERATOR CHANGEOUT N/A 01/15/2017   [employer] Accolade MRI EL DL pacemaker for CHB by Dr Rayann Heman   PULSE GENERATOR IMPLANT     Guidant Ultra dual-mode, dual-pacing, dual-sensing generator for complete atrioventricular block 2006 with some noise on atrial lead and generator recall for very low incidence of loss of output   Social History   Occupational History   Not on file  Tobacco Use   Smoking status: Never   Smokeless tobacco: Never  Substance and Sexual Activity   Alcohol use: No    Alcohol/week: 0.0 standard drinks   Drug use: No   Sexual activity: Not on file

## 2021-02-20 NOTE — ED Notes (Signed)
Rectal temp 94.9 despite bair hugger application. Notified MD.

## 2021-02-20 NOTE — ED Triage Notes (Signed)
Pt BIB POV from dr.'s office after being found lying on the ground by family member, who was coming to take her to her appointment. Family member states she was found laying on the floor twice this morning. Pt went to dr.'s office, and was told to come to the ER. Pt is on Eliquis.

## 2021-02-20 NOTE — ED Notes (Signed)
Pt back in ED from CT

## 2021-02-20 NOTE — ED Notes (Signed)
EDP notified of patient being brought to room.

## 2021-02-20 NOTE — ED Notes (Signed)
Pt to go to CT3 when ready. Potential delay to CT as pt was activated after she arrived to ED and pt is having a sepsis workup.

## 2021-02-20 NOTE — ED Notes (Signed)
Notified EDP of temp

## 2021-02-20 NOTE — ED Notes (Signed)
EDP notified of rectal temp and sepsis orders initiated per protocol and positive sepsis screening

## 2021-02-20 NOTE — ED Notes (Signed)
Provider aware of pt temp despite bair hugger. Will continue to monitor

## 2021-02-20 NOTE — ED Notes (Signed)
Lab called and notified primary RN that PTT hemolyzed and needs to be recollected.

## 2021-02-20 NOTE — ED Notes (Signed)
Delay in obtaining full set of VS w/in 30 min window d/t unable to obtain pulse oximeter reading d/t pt being cold to touch

## 2021-02-20 NOTE — ED Notes (Signed)
Phlebotomy at bedside obtaining labs 

## 2021-02-20 NOTE — Consult Note (Addendum)
WOC Nurse Consult Note: Reason for Consult: Consult requested for left foot.  Performed remotely after review of progress notes and photo in the EMR.  Wound type: Chronic full thickness wound to left anterior foot; pt has been followed by Dr Lajoyce Corners of the ortho service prior to admission and was seen in his office today for a wound assessment; refer to previous progress notes. He has ordered Silvadene for topical treatment; continue present plan of care.  Dressing procedure/placement/frequency: Topical treatment orders provided for bedside nurses to perform as follows: Apply Silvadene to left foot wound Q day, then cover with nonadherent dressing and ABD pad and kerlex. Wipe away previous cream each time with moist gauze before applying more.  Pt should follow-up with Dr Lajoyce Corners after discharge.  Please re-consult if further assistance is needed.  Thank-you,  Cammie Mcgee MSN, RN, CWOCN, [place], CNS [phone]

## 2021-02-20 NOTE — ED Provider Notes (Signed)
[facility] EMERGENCY DEPARTMENT Provider Note   CSN: [id number] Arrival date & time: 02/20/21  [id number]     History Chief Complaint  Patient presents with   [name] [name] is a 87 y.o. female.  Patient s/p fall this AM at home. Lives independently, had been ambulatory on own until recently when started using walker. Recent chronic bilateral leg swelling, wound on dorsum left foot - daughter indicates initially s/p yellow jacket stings and that large area on dorsum left foot was debrided in wound clinic a week ago, is on doxyxycline. Pts family member took patient to her doctors appt today for her leg swelling/wounds, where wound specialist changed dressings, and felt ok from that standpoint, but advised to come to ED for evaluation given fall and on blood thinner therapy. Pt denies any specific c/o. No faintness or dizziness. No chest pain. No sob. No abd pain or nv. No neck or back pain. No extremity pain or injury. No headache.   The history is provided by the patient, medical records and a relative. The history is limited by the condition of the patient.  Fall Pertinent negatives include no chest pain, no abdominal pain, no headaches and no shortness of breath.      Past Medical History:  Diagnosis Date   Atrioventricular block, complete ([place]) 2006   Hyperlipidemia    Hypertension    Paroxysmal atrial fibrillation ([place]) 2016   single episode detected on PPM interrogation    Patient Active Problem List   Diagnosis Date Noted   Mild dementia 12/20/2020   Epidermal cyst 11/03/2017   Hypothyroidism 06/09/2017   Vitamin D deficiency 05/16/2016   Atrial fibrillation ([place]) 01/06/2015   Paroxysmal atrial fibrillation ([place]) 04/01/2014   AV BLOCK, COMPLETE 04/25/2010   HLD (hyperlipidemia) 10/30/2008   HYPERTENSION, BENIGN 10/30/2008   PACEMAKER, PERMANENT 10/30/2008    Past Surgical History:  Procedure Laterality Date   ABDOMINAL HYSTERECTOMY      EYE SURGERY Bilateral    cataracts   GALLBLADDER SURGERY     Normal coronary angiography  1999   PPM GENERATOR CHANGEOUT N/A 01/15/2017   [employer] Accolade MRI EL DL pacemaker for CHB by Dr Rayann Heman   PULSE GENERATOR IMPLANT     Guidant Ultra dual-mode, dual-pacing, dual-sensing generator for complete atrioventricular block 2006 with some noise on atrial lead and generator recall for very low incidence of loss of output     OB History   No obstetric history on file.     Family History  Problem Relation Age of Onset   Cancer Sister        breast    Social History   Tobacco Use   Smoking status: Never   Smokeless tobacco: Never  Substance Use Topics   Alcohol use: No    Alcohol/week: 0.0 standard drinks   Drug use: No    Home Medications Prior to Admission medications   Medication Sig Start Date End Date Taking? Authorizing Provider  apixaban (ELIQUIS) 5 MG TABS tablet Take 1 tablet (5 mg total) by mouth 2 (two) times daily. (Needs to be seen before next refill) 09/29/20   Janora Norlander, DO  atorvastatin (LIPITOR) 10 MG tablet Take 1 tablet (10 mg total) by mouth every evening. 10/30/20   Janora Norlander, DO  Cholecalciferol (VITAMIN D3) 1.25 MG (50000 UT) CAPS TAKE 1 CAPSULE (50,000 UNITS TOTAL) BY MOUTH EVERY 7 (SEVEN) DAYS. X12 WEEKS 08/02/19   Ronnie Doss  M, DO  ciprofloxacin (CIPRO) 500 MG tablet Take 1 tablet (500 mg total) by mouth 2 (two) times daily. Twice a day for 2 weeks. 02/19/21   Newt Minion, MD  diltiazem (CARDIZEM CD) 120 MG 24 hr capsule TAKE 1 CAPSULE BY MOUTH EVERY DAY 09/14/20   Ronnie Doss M, DO  dorzolamide-timolol (COSOPT) 22.3-6.8 MG/ML ophthalmic solution Place 1 drop into both eyes 2 (two) times daily. 04/29/19   [provider]  doxycycline (VIBRA-TABS) 100 MG tablet Take 1 tablet (100 mg total) by mouth 2 (two) times daily for 10 days. 1 po bid 02/14/21 02/24/21  Baruch Gouty, FNP  hydrochlorothiazide  (HYDRODIURIL) 12.5 MG tablet TAKE 1 TABLET BY MOUTH EVERY DAY 09/14/20   Ronnie Doss M, DO  levothyroxine (SYNTHROID) 75 MCG tablet Take 1 tablet (75 mcg total) by mouth daily before breakfast. 11/29/20   Ronnie Doss M, DO  memantine (NAMENDA) 5 MG tablet Take 1 tablet (5 mg total) by mouth daily for 30 days, THEN 1 tablet (5 mg total) 2 (two) times daily. 01/10/21 03/11/21  Janora Norlander, DO  Multiple Vitamins-Minerals (CENTRUM SILVER 50+WOMEN) TABS Take 1 tablet by mouth daily.    [provider]  Multiple Vitamins-Minerals (MULTIVITAMIN WITH MINERALS) tablet Take 1 tablet by mouth daily.    [provider]  neomycin-polymyxin b-dexamethasone (MAXITROL) 3.5-10000-0.1 OINT SMARTSIG:Sparingly In Eye(s) 3-4 Times Daily 12/28/20   [provider]  silver sulfADIAZINE (SILVADENE) 1 % cream Apply 1 application topically daily. Apply to affected area daily plus dry dressing 02/15/21   Newt Minion, MD    Allergies    Patient has no known allergies.  Review of Systems   Review of Systems  Constitutional:  Negative for fever.  HENT:  Negative for nosebleeds.   Eyes:  Negative for redness.  Respiratory:  Negative for shortness of breath.   Cardiovascular:  Negative for chest pain.  Gastrointestinal:  Negative for abdominal pain and vomiting.  Genitourinary:  Negative for flank pain.  Musculoskeletal:  Negative for back pain and neck pain.  Skin:  Negative for rash.  Neurological:  Negative for headaches.  Hematological:  Does not bruise/bleed easily.  Psychiatric/Behavioral:  Negative for agitation.    Physical Exam Updated Vital Signs BP 108/87 (BP Location: Right Arm)   Pulse 60   Resp 16   Physical Exam Vitals and nursing note reviewed.  Constitutional:      Appearance: Normal appearance. She is well-developed.  HENT:     Head: Atraumatic.     Nose: Nose normal.     Mouth/Throat:     Mouth: Mucous membranes are moist.  Eyes:      General: No scleral icterus.    Conjunctiva/sclera: Conjunctivae normal.     Pupils: Pupils are equal, round, and reactive to light.  Neck:     Trachea: No tracheal deviation.  Cardiovascular:     Rate and Rhythm: Normal rate and regular rhythm.     Pulses: Normal pulses.     Heart sounds: Normal heart sounds. No murmur heard.   No friction rub. No gallop.  Pulmonary:     Effort: Pulmonary effort is normal. No respiratory distress.     Breath sounds: Normal breath sounds.  Abdominal:     General: Bowel sounds are normal. There is no distension.     Palpations: Abdomen is soft.     Tenderness: There is no abdominal tenderness.  Genitourinary:    Comments: No cva tenderness.  Musculoskeletal:  General: No tenderness.     Cervical back: Normal range of motion and neck supple. No rigidity. No muscular tenderness.     Comments: CTLS spine, non tender, aligned, no step off. Good rom bilateral extremities without pain or focal bony tenderness. Edema to bilateral feet, ankles, lower legs with blisters to anterior RLE. Bilateral feet are cool as compared to legs/lower legs, dp is dopplerable bil. Open wound to dorsum left foot  ~ 8 cm diameter, no necrotic or devitalized tissue, no crepitus, no purulent drainage or gross malodor.   Skin:    General: Skin is warm and dry.     Findings: No rash.  Neurological:     Mental Status: She is alert.     Comments: Alert, speech normal. Motor/sens grossly intact bil.   Psychiatric:        Mood and Affect: Mood normal.    ED Results / Procedures / Treatments   Labs (all labs ordered are listed, but only abnormal results are displayed) Results for orders placed or performed during the hospital encounter of 02/20/21  Comprehensive metabolic panel  Result Value Ref Range   Sodium 136 135 - 145 mmol/L   Potassium 4.5 3.5 - 5.1 mmol/L   Chloride 104 98 - 111 mmol/L   CO2 13 (L) 22 - 32 mmol/L   Glucose, Bld 96 70 - 99 mg/dL   BUN 35 (H) 8 -  23 mg/dL   Creatinine, Ser 1.20 (H) 0.44 - 1.00 mg/dL   Calcium 10.0 8.9 - 10.3 mg/dL   Total Protein 6.3 (L) 6.5 - 8.1 g/dL   Albumin 3.3 (L) 3.5 - 5.0 g/dL   AST 88 (H) 15 - 41 U/L   ALT 58 (H) 0 - 44 U/L   Alkaline Phosphatase 101 38 - 126 U/L   Total Bilirubin 1.7 (H) 0.3 - 1.2 mg/dL   GFR, Estimated 42 (L) >60 mL/min   Anion gap 19 (H) 5 - 15  CBC with Differential  Result Value Ref Range   WBC 15.9 (H) 4.0 - 10.5 K/uL   RBC 5.05 3.87 - 5.11 MIL/uL   Hemoglobin 17.1 (H) 12.0 - 15.0 g/dL   HCT 50.9 (H) 36.0 - 46.0 %   MCV 100.8 (H) 80.0 - 100.0 fL   MCH 33.9 26.0 - 34.0 pg   MCHC 33.6 30.0 - 36.0 g/dL   RDW 17.6 (H) 11.5 - 15.5 %   Platelets 106 (L) 150 - 400 K/uL   nRBC 0.0 0.0 - 0.2 %   Neutrophils Relative % 90 %   Neutro Abs 14.3 (H) 1.7 - 7.7 K/uL   Lymphocytes Relative 4 %   Lymphs Abs 0.7 0.7 - 4.0 K/uL   Monocytes Relative 5 %   Monocytes Absolute 0.8 0.1 - 1.0 K/uL   Eosinophils Relative 0 %   Eosinophils Absolute 0.0 0.0 - 0.5 K/uL   Basophils Relative 0 %   Basophils Absolute 0.0 0.0 - 0.1 K/uL   WBC Morphology MORPHOLOGY UNREMARKABLE    Smear Review PLATELETS APPEAR DECREASED    Immature Granulocytes 1 %   Abs Immature Granulocytes 0.09 (H) 0.00 - 0.07 K/uL  CK  Result Value Ref Range   Total CK 977 (H) 38 - 234 U/L  Ethanol  Result Value Ref Range   Alcohol, Ethyl (B) <10 <10 mg/dL  Lactic acid, plasma  Result Value Ref Range   Lactic Acid, Venous 4.6 (HH) 0.5 - 1.9 mmol/L  Protime-INR  Result Value Ref Range  Prothrombin Time 17.5 (H) 11.4 - 15.2 seconds   INR 1.4 (H) 0.8 - 1.2  I-Stat Chem 8, ED  Result Value Ref Range   Sodium 134 (L) 135 - 145 mmol/L   Potassium 4.4 3.5 - 5.1 mmol/L   Chloride 108 98 - 111 mmol/L   BUN 38 (H) 8 - 23 mg/dL   Creatinine, Ser 0.90 0.44 - 1.00 mg/dL   Glucose, Bld 96 70 - 99 mg/dL   Calcium, Ion 1.00 (L) 1.15 - 1.40 mmol/L   TCO2 18 (L) 22 - 32 mmol/L   Hemoglobin 18.0 (H) 12.0 - 15.0 g/dL   HCT 53.0 (H)  36.0 - 46.0 %   DG Foot Complete Left  Result Date: 02/06/2021 CLINICAL DATA:  Left foot swelling with redness and drainage after insect sting 2 weeks ago EXAM: LEFT FOOT - COMPLETE 3+ VIEW COMPARISON:  None. FINDINGS: There is no evidence of fracture or dislocation. No erosion or periosteal elevation. Degenerative changes are most pronounced within the interphalangeal joints of the second and third toes. Prominent soft tissue swelling about the forefoot. No soft tissue gas. IMPRESSION: 1. No radiographic evidence to suggest osteomyelitis. 2. Prominent soft tissue swelling about the forefoot. No soft tissue gas. Electronically Signed   By: Davina Poke D.O.   On: 02/06/2021 16:47   CUP PACEART REMOTE DEVICE CHECK  Result Date: 02/07/2021 Scheduled remote reviewed. Normal device function.  1 NSVT, slightly irregular, RVR vs NSVT.  Onset not available, abrupt termination. Known PAF, Eliquis Next remote 91 days. LR   EKG EKG Interpretation  Date/Time:  Tuesday February 20 2021 10:08:34 EST Ventricular Rate:  60 PR Interval:  170 QRS Duration: 209 QT Interval:  562 QTC Calculation: 562 R Axis:   -80 Text Interpretation: Electronic ventricular pacemaker Confirmed by Lajean Saver [phone]) on 02/20/2021 10:20:27 AM  Radiology CT HEAD WO CONTRAST  Result Date: 02/20/2021 CLINICAL DATA:  Head trauma EXAM: CT HEAD WITHOUT CONTRAST TECHNIQUE: Contiguous axial images were obtained from the base of the skull through the vertex without intravenous contrast. COMPARISON:  None. FINDINGS: Brain: No acute intracranial hemorrhage, mass effect, or herniation. No extra-axial fluid collections. No evidence of acute territorial infarct. No hydrocephalus. Mild cortical volume loss. Patchy hypodensities in the periventricular and subcortical white matter, likely secondary to chronic microvascular ischemic changes. Vascular: No hyperdense vessel or unexpected calcification. Skull: Normal. Negative for fracture  or focal lesion. Sinuses/Orbits: No acute process identified. Surgical changes in the globes. Other: None. IMPRESSION: Chronic changes with no acute intracranial process identified. Electronically Signed   By: Ofilia Neas M.D.   On: 02/20/2021 10:59   DG Pelvis Portable  Result Date: 02/20/2021 CLINICAL DATA:  Trauma, fall EXAM: PORTABLE PELVIS 1-2 VIEWS COMPARISON:  None. FINDINGS: No displaced fracture or dislocation is seen. There is a faint linear lucency in the lateral aspect of greater trochanter in the left femur. Degenerative changes are noted in the lower lumbar spine. IMPRESSION: No displaced fracture or dislocation is seen. There is linear lucency seen in the greater trochanter of the proximal left femur which may be an artifact due to confluence of soft tissues over the bony margin or suggest undisplaced fracture. Please correlate with clinical symptoms and physical examination findings. If there are focal symptoms in the area of greater trochanter of proximal left femur, repeat radiographic examination and CT if warranted should be considered. Electronically Signed   By: Elmer Picker M.D.   On: 02/20/2021 10:46   DG Chest [facility]  1 View  Result Date: 02/20/2021 CLINICAL DATA:  Trauma, sepsis EXAM: PORTABLE CHEST 1 VIEW COMPARISON:  None FINDINGS: Transverse diameter of heart is increased. Central pulmonary vessels are prominent. There are no signs of alveolar pulmonary edema. Small bilateral pleural effusions are seen, more so on the left side. Evaluation of lower lung fields for infiltrates is limited by pleural effusions. There is no pneumothorax. Pacemaker battery is seen in the left infraclavicular region with tips of leads in the right atrium and right ventricle. IMPRESSION: Cardiomegaly. Small bilateral pleural effusions, more so on the left side. There are no signs of alveolar pulmonary edema. Evaluation of lower lung fields for infiltrates is limited by the effusions.  Electronically Signed   By: Ernie Avena M.D.   On: 02/20/2021 10:42    Procedures Procedures   Medications Ordered in ED Medications - No data to display  ED Course  I have reviewed the triage vital signs and the nursing notes.  Pertinent labs & imaging results that were available during my care of the patient were reviewed by me and considered in my medical decision making (see chart for details).    MDM Rules/Calculators/A&P                         3 Labs sent/imaging ordered.  Reviewed nursing notes and prior charts for additional history.   Labs reviewed/interpreted by me - total ck elevated. ?unclear how long patient may have been on ground prior to family member finding her. Iv ns bolus.   Cultures sent. Iv antibiotics given.   Rectal temp is low. Bair hugger, additional labs/cultures added.   CT reviewed/interpreted by me -  no hem.   CXR reviewed/interpreted by me -  no pna.   Additional labs reviewed/interpreted by me - lactate is high. Additional LR bolus.   Medicine/hospitalists consulted for admission.   CRITICAL CARE RE: hypothermia w elevated lactate/wbc, sepsis, bilateral foot wounds?PVD Performed by: Suzi Roots Total critical care time: 45 minutes Critical care time was exclusive of separately billable procedures and treating other patients. Critical care was necessary to treat or prevent imminent or life-threatening deterioration. Critical care was time spent personally by me on the following activities: development of treatment plan with patient and/or surrogate as well as nursing, discussions with consultants, evaluation of patient's response to treatment, examination of patient, obtaining history from patient or surrogate, ordering and performing treatments and interventions, ordering and review of laboratory studies, ordering and review of radiographic studies, pulse oximetry and re-evaluation of patient's condition.      Final Clinical  Impression(s) / ED Diagnoses Final diagnoses:  None    Rx / DC Orders ED Discharge Orders     None        Cathren Laine, MD 02/20/21 1226

## 2021-02-20 NOTE — ED Notes (Signed)
Pt transported to CT by TRN 

## 2021-02-20 NOTE — ED Triage Notes (Signed)
Daughter stated, she fell this morning and hit her head , takes Elaquis. Lives by herself. She said she hit the top of head.

## 2021-02-20 NOTE — ED Notes (Signed)
Lab called to notify care team that the last lab samples drawn by phlebotomy need to be recollected

## 2021-02-20 NOTE — ED Provider Notes (Signed)
Emergency Medicine Provider Triage Evaluation Note  [name] [name] , a 87 y.o. female  was evaluated in triage.  Pt complains of fall x2. Family present in triage. States family checked on her this am and she was on the floor. They left to get the car started and in that time she fell again. Pt denies pain.  Review of Systems  Positive: fall Negative: Cough, fever  Physical Exam  BP 108/87 (BP Location: Right Arm)   Pulse 60   Resp 16  Gen:   Awake, no distress   Resp:  Normal effort  MSK:   Moves extremities without difficulty  Other:  Clear speech, no facial droop, no midline cervical spine ttp, ace wraps noted to the ble  Medical Decision Making  Medically screening exam initiated at 9:48 AM.  Appropriate orders placed.  Marzetta Board Sheer was informed that the remainder of the evaluation will be completed by another provider, this initial triage assessment does not replace that evaluation, and the importance of remaining in the ED until their evaluation is complete.     Rayne Du 02/20/21 2536    Gloris Manchester, MD 02/20/21 [phone]

## 2021-02-21 ENCOUNTER — Inpatient Hospital Stay (HOSPITAL_COMMUNITY): Payer: Medicare HMO

## 2021-02-21 DIAGNOSIS — A419 Sepsis, unspecified organism: Secondary | ICD-10-CM | POA: Diagnosis not present

## 2021-02-21 DIAGNOSIS — L97522 Non-pressure chronic ulcer of other part of left foot with fat layer exposed: Secondary | ICD-10-CM

## 2021-02-21 DIAGNOSIS — R6520 Severe sepsis without septic shock: Secondary | ICD-10-CM | POA: Diagnosis not present

## 2021-02-21 DIAGNOSIS — I5040 Unspecified combined systolic (congestive) and diastolic (congestive) heart failure: Secondary | ICD-10-CM

## 2021-02-21 DIAGNOSIS — G9340 Encephalopathy, unspecified: Secondary | ICD-10-CM | POA: Diagnosis not present

## 2021-02-21 LAB — HEPATIC FUNCTION PANEL
ALT: 39 U/L (ref 0–44)
AST: 70 U/L — ABNORMAL HIGH (ref 15–41)
Albumin: 3.9 g/dL (ref 3.5–5.0)
Alkaline Phosphatase: 72 U/L (ref 38–126)
Bilirubin, Direct: 0.6 mg/dL — ABNORMAL HIGH (ref 0.0–0.2)
Indirect Bilirubin: 1.3 mg/dL — ABNORMAL HIGH (ref 0.3–0.9)
Total Bilirubin: 1.9 mg/dL — ABNORMAL HIGH (ref 0.3–1.2)
Total Protein: 5.8 g/dL — ABNORMAL LOW (ref 6.5–8.1)

## 2021-02-21 LAB — CBC
HCT: 37.7 % (ref 36.0–46.0)
Hemoglobin: 13.1 g/dL (ref 12.0–15.0)
MCH: 33.6 pg (ref 26.0–34.0)
MCHC: 34.7 g/dL (ref 30.0–36.0)
MCV: 96.7 fL (ref 80.0–100.0)
Platelets: 89 10*3/uL — ABNORMAL LOW (ref 150–400)
RBC: 3.9 MIL/uL (ref 3.87–5.11)
RDW: 17.4 % — ABNORMAL HIGH (ref 11.5–15.5)
WBC: 8.2 10*3/uL (ref 4.0–10.5)
nRBC: 0.2 % (ref 0.0–0.2)

## 2021-02-21 LAB — ANAEROBIC AND AEROBIC CULTURE

## 2021-02-21 LAB — HEPARIN LEVEL (UNFRACTIONATED)
Heparin Unfractionated: 0.29 IU/mL — ABNORMAL LOW (ref 0.30–0.70)
Heparin Unfractionated: 0.34 IU/mL (ref 0.30–0.70)

## 2021-02-21 LAB — BASIC METABOLIC PANEL
Anion gap: 12 (ref 5–15)
BUN: 33 mg/dL — ABNORMAL HIGH (ref 8–23)
CO2: 18 mmol/L — ABNORMAL LOW (ref 22–32)
Calcium: 9.6 mg/dL (ref 8.9–10.3)
Chloride: 107 mmol/L (ref 98–111)
Creatinine, Ser: 1.12 mg/dL — ABNORMAL HIGH (ref 0.44–1.00)
GFR, Estimated: 46 mL/min — ABNORMAL LOW (ref >60)
Glucose, Bld: 66 mg/dL — ABNORMAL LOW (ref 70–99)
Potassium: 3.7 mmol/L (ref 3.5–5.1)
Sodium: 137 mmol/L (ref 135–145)

## 2021-02-21 LAB — ECHOCARDIOGRAM COMPLETE
Area-P 1/2: 4.04 cm2
Calc EF: 22.5 %
Height: 61 in
MV M vel: 3.97 m/s
MV Peak grad: 63 mmHg
Radius: 0.55 cm
S' Lateral: 4.6 cm
Single Plane A2C EF: 25.4 %
Single Plane A4C EF: 18.2 %
Weight: 1537.93 oz

## 2021-02-21 LAB — CK: Total CK: 481 U/L — ABNORMAL HIGH (ref 38–234)

## 2021-02-21 MED ORDER — ADULT MULTIVITAMIN W/MINERALS CH
1.0000 | ORAL_TABLET | Freq: Every day | ORAL | Status: DC
  Administered 2021-02-21 – 2021-02-28 (×8): 1 via ORAL
  Filled 2021-02-21 (×8): qty 1

## 2021-02-21 MED ORDER — SODIUM CHLORIDE 0.9 % IV SOLN
2.0000 g | INTRAVENOUS | Status: DC
  Administered 2021-02-22: 2 g via INTRAVENOUS
  Filled 2021-02-21: qty 2

## 2021-02-21 MED ORDER — ENSURE ENLIVE PO LIQD
237.0000 mL | Freq: Two times a day (BID) | ORAL | Status: DC
  Administered 2021-02-21 – 2021-02-25 (×8): 237 mL via ORAL

## 2021-02-21 MED ORDER — JUVEN PO PACK
1.0000 | PACK | Freq: Two times a day (BID) | ORAL | Status: DC
  Administered 2021-02-21 – 2021-02-28 (×12): 1 via ORAL
  Filled 2021-02-21 (×15): qty 1

## 2021-02-21 MED ORDER — PERFLUTREN LIPID MICROSPHERE
1.0000 mL | INTRAVENOUS | Status: AC | PRN
Start: 2021-02-21 — End: 2021-02-21
  Administered 2021-02-21: 2 mL via INTRAVENOUS

## 2021-02-21 NOTE — Progress Notes (Signed)
ABI w/ TBI study completed.   Please see CV Proc for preliminary results.   Makiyah Zentz, RDMS, RVT  

## 2021-02-21 NOTE — Progress Notes (Signed)
ANTICOAGULATION CONSULT NOTE  Pharmacy Consult for Heparin Indication: atrial fibrillation  No Known Allergies  Patient Measurements: Height: 5\' 1"  (154.9 cm) Weight: 43.6 kg (96 lb 1.9 oz) IBW/kg (Calculated) : 47.8  Heparin Dosing Weight: 46 kg  Vital Signs: Temp: 97.5 F (36.4 C) (11/23 1133) Temp Source: Oral (11/23 1133) BP: 105/67 (11/23 1133) Pulse Rate: 61 (11/23 1133)  Labs: Recent Labs    02/20/21 0947 02/20/21 1021 02/20/21 1147 02/20/21 1229 02/20/21 1537 02/20/21 2224 02/21/21 0324 02/21/21 1141  HGB 17.1* 18.0*  --   --   --   --  13.1  --   HCT 50.9* 53.0*  --   --   --   --  37.7  --   PLT 106*  --   --   --   --   --  89*  --   APTT  --   --   --   --   --  62*  --   --   LABPROT  --   --  17.5* 17.4*  --   --   --   --   INR  --   --  1.4* 1.4*  --   --   --   --   HEPARINUNFRC  --   --   --   --   --  0.10* 0.29* 0.34  CREATININE 1.20* 0.90  --   --   --   --  1.12*  --   CKTOTAL 977*  --   --   --   --   --  481*  --   TROPONINIHS  --   --   --   --  33*  --   --   --     Estimated Creatinine Clearance: 21.1 mL/min (A) (by C-G formula based on SCr of 1.12 mg/dL (H)).   Medical History: Past Medical History:  Diagnosis Date   Atrioventricular block, complete (HCC) 2006   Hyperlipidemia    Hypertension    Paroxysmal atrial fibrillation (HCC) 2016   single episode detected on PPM interrogation    Medications:  Scheduled:   levothyroxine  75 mcg Oral QAC breakfast   memantine  5 mg Oral QHS   Infusions:   sodium chloride Stopped (02/20/21 2316)   ceFEPime (MAXIPIME) IV 2 g (02/21/21 0901)   heparin 750 Units/hr (02/21/21 0402)   [START ON 02/22/2021] vancomycin      Assessment: 87 YO F with history significant for atrial fibrillation on Eliquis PTA who presented 11/22 with a wound on L foot and concern for sepsis. LKD Eliquis 11/21 at 0900. Pharmacy consulted to start IV heparin.   Heparin level slightly subtherapeutic at 0.29  but drawn early. Heparin level now at 0.34, therapeutic. CBC wnl, no s/sx bleeding reported or line issues reported. Continue heparin at current rate.   Goal of Therapy:  Heparin level 0.3-0.7 units/ml Monitor platelets by anticoagulation protocol: Yes   Plan:  Continue heparin infusion at 750 units/hr Check heparin level daily while on heparin Follow up plan to transition back to PO anticoagulant Continue to monitor H&H and platelets    Thank you for allowing pharmacy to be a part of this patient's care.  12/21, PharmD Clinical Pharmacist

## 2021-02-21 NOTE — Plan of Care (Signed)

## 2021-02-21 NOTE — Progress Notes (Signed)
Mobility Specialist Progress Note:   02/21/21 1500  Mobility  Activity Transferred:  Chair to bed  Level of Assistance Minimal assist, patient does 75% or more  Assistive Device Other (Comment) (HHA)  Distance Ambulated (ft) 6 ft  Mobility Response Tolerated well  Mobility performed by Mobility specialist  $Mobility charge 1 Mobility   [facility] Primary Phone [phone] Secondary Phone [phone]

## 2021-02-21 NOTE — Progress Notes (Signed)
PHARMACY NOTE:  ANTIMICROBIAL RENAL DOSAGE ADJUSTMENT  Current antimicrobial regimen includes a mismatch between antimicrobial dosage and estimated renal function.  As per policy approved by the Pharmacy & Therapeutics and Medical Executive Committees, the antimicrobial dosage will be adjusted accordingly.  Current antimicrobial dosage:  cefepime 2g q12h  Indication: sepsis  Renal Function:  Estimated Creatinine Clearance: 21.1 mL/min (A) (by C-G formula based on SCr of 1.12 mg/dL (H)).    Antimicrobial dosage has been changed to:  cefepime 2g q24h  Additional comments: Will continue to monitor scr/CrCl closely and adjust cefepim as appropriate   Thank you for allowing pharmacy to be a part of this patient's care.  Gerrit Halls, PharmD, BCPS Clinical Pharmacist 02/21/2021 5:54 PM

## 2021-02-21 NOTE — Evaluation (Signed)
Physical Therapy Evaluation Patient Details Name: [name] [name] MRN: [id number] DOB: May 07, 1924 Today's Date: 02/21/2021  History of Present Illness  Pt is a 87 year old woman admitted after falling on 02/20/21.  Pt + sepsis PMH: non healing wound on dorsum of L foot sustained when walking in her yard barefoot and encountering yellow jackets, has been being treated by PCP and orthopedist wth outpt debridement, HLD, PAF, hypothyroid, SSS with pacemaker, mild dementia.  Clinical Impression  Pt admitted with above diagnosis. At baseline, pt lives alone with family checking on her twice a day and available more if needed.  She ambulates with RW.  Today, pt very pleasant and able to transfer/ambulate with min guard- min A level.  She had no complaints of foot pain. She did fatigue easily and did not want to ambulate in hallway.  Pt near her baseline - recommend return home with family supervision.  May need updating pending further ortho recommendations/plans for L foot.  Pt currently with functional limitations due to the deficits listed below (see PT Problem List). Pt will benefit from skilled PT to increase their independence and safety with mobility to allow discharge to the venue listed below.          Recommendations for follow up therapy are one component of a multi-disciplinary discharge planning process, led by the attending physician.  Recommendations may be updated based on patient status, additional functional criteria and insurance authorization.  Follow Up Recommendations Home health PT (may need updating pending ortho decisions regarding foot)    Assistance Recommended at Discharge Intermittent Supervision/Assistance  Functional Status Assessment Patient has had a recent decline in their functional status and demonstrates the ability to make significant improvements in function in a reasonable and predictable amount of time.  Equipment Recommendations  None recommended by PT     Recommendations for Other Services       Precautions / Restrictions Precautions Precautions: Fall Restrictions Weight Bearing Restrictions: No      Mobility  Bed Mobility Overal bed mobility: Needs Assistance Bed Mobility: Supine to Sit     Supine to sit: Min assist     General bed mobility comments: assist to raise trunk pulling up on therapist's hand, increased time, HOB up    Transfers Overall transfer level: Needs assistance Equipment used: Rolling walker (2 wheels) Transfers: Sit to/from Stand Sit to Stand: Min assist;Min guard           General transfer comment: min guard from bed, min from [facility] to rise    Ambulation/Gait Ambulation/Gait assistance: Min guard Gait Distance (Feet): 24 Feet Assistive device: Rolling walker (2 wheels) Gait Pattern/deviations: Step-through pattern;Decreased stride length Gait velocity: decreased     General Gait Details: min guard for safety; cues for RW with turns  Science writer    Modified Rankin (Stroke Patients Only)       Balance Overall balance assessment: Needs assistance   Sitting balance-Leahy Scale: Good     Standing balance support: During functional activity;Reliant on assistive device for balance;No upper extremity supported Standing balance-Leahy Scale: Fair Standing balance comment: fair statically, reliant on RW for dynamic balance                             Pertinent Vitals/Pain Pain Assessment: No/denies pain    Home Living Family/patient expects to be discharged to:: Private residence Living Arrangements:  Alone Available Help at Discharge: Family;Available PRN/intermittently Type of Home: House Home Access: Stairs to enter Entrance Stairs-Rails: None Entrance Stairs-Number of Steps: 3   Home Layout: One level;Laundry or work area in basement (does not go in basement) Home Equipment: Agricultural consultant (2 wheels) Additional Comments: family checks  on her 2x per day, son is retired if she needs 24 hour care, daughter is employed    Prior Function Prior Level of Function : Independent/Modified Independent             Mobility Comments: walks with RW ADLs Comments: sponge bathes, likes to be outside, does not drive     Hand Dominance   Dominant Hand: Right    Extremity/Trunk Assessment   Upper Extremity Assessment Upper Extremity Assessment: Overall WFL for tasks assessed    Lower Extremity Assessment Lower Extremity Assessment: LLE deficits/detail;RLE deficits/detail RLE Deficits / Details: ROM WFL; MMT 5/5 LLE Deficits / Details: ROM WFL; MMT 5/5 except did not test ankle due to wound location    Cervical / Trunk Assessment Cervical / Trunk Assessment: Kyphotic  Communication   Communication: HOH  Cognition Arousal/Alertness: Awake/alert Behavior During Therapy: WFL for tasks assessed/performed Overall Cognitive Status: History of cognitive impairments - at baseline                                 General Comments: hx of dementia, likely baseline, pleasant        General Comments      Exercises     Assessment/Plan    PT Assessment Patient needs continued PT services  PT Problem List Decreased strength;Decreased mobility;Decreased safety awareness;Decreased activity tolerance;Decreased balance;Decreased knowledge of use of DME       PT Treatment Interventions DME instruction;Therapeutic activities;Gait training;Patient/family education;Therapeutic exercise;Functional mobility training;Balance training    PT Goals (Current goals can be found in the Care Plan section)  Acute Rehab PT Goals Patient Stated Goal: return home PT Goal Formulation: With patient/family Time For Goal Achievement: 03/07/21 Potential to Achieve Goals: Good    Frequency Min 3X/week   Barriers to discharge        Co-evaluation               AM-PAC PT "6 Clicks" Mobility  Outcome Measure Help needed  turning from your back to your side while in a flat bed without using bedrails?: A Little Help needed moving from lying on your back to sitting on the side of a flat bed without using bedrails?: A Little Help needed moving to and from a bed to a chair (including a wheelchair)?: A Little Help needed standing up from a chair using your arms (e.g., wheelchair or bedside chair)?: A Little Help needed to walk in hospital room?: A Little Help needed climbing 3-5 steps with a railing? : A Little 6 Click Score: 18    End of Session Equipment Utilized During Treatment: Gait belt Activity Tolerance: Patient tolerated treatment well Patient left: with chair alarm set;in chair;with call bell/phone within reach Nurse Communication: Mobility status PT Visit Diagnosis: Other abnormalities of gait and mobility (R26.89);Muscle weakness (generalized) (M62.81)    Time: 2694-8546 PT Time Calculation (min) (ACUTE ONLY): 26 min   Charges:   PT Evaluation $PT Eval Low Complexity: 1 Low          Blayke Cordrey, PT Acute Rehab Services Pager [phone] [facility] [phone]   Rayetta Humphrey 02/21/2021, 2:12 PM

## 2021-02-21 NOTE — Progress Notes (Signed)
  Echocardiogram 2D Echocardiogram has been performed.  [name] [name] 02/21/2021, 10:03 AM

## 2021-02-21 NOTE — Progress Notes (Signed)
Initial Nutrition Assessment  DOCUMENTATION CODES:  Underweight  INTERVENTION:  Add Ensure Plus High Protein po BID, each supplement provides 350 kcal and 20 grams of protein.   Add 1 packet Juven BID, each packet provides 95 calories, 2.5 grams of protein (collagen), and 9.8 grams of carbohydrate (3 grams sugar); also contains 7 grams of L-arginine and L-glutamine, 300 mg vitamin C, 15 mg vitamin E, 1.2 mcg vitamin B-12, 9.5 mg zinc, 200 mg calcium, and 1.5 g  Calcium Beta-hydroxy-Beta-methylbutyrate to support wound healing.  Add MVI with minerals daily.  Encourage PO and supplement intake.  NUTRITION DIAGNOSIS:  Increased nutrient needs related to acute illness, wound healing (sepsis) as evidenced by estimated needs.  GOAL:  Patient will meet greater than or equal to 90% of their needs  MONITOR:  PO intake, Supplement acceptance, Labs, Weight trends, Skin, I & O's  REASON FOR ASSESSMENT:  Consult Assessment of nutrition requirement/status  ASSESSMENT:  87 yo female with a PMH of non healing wound on dorsum of L foot sustained when walking in her yard barefoot and encountering yellow jackets, has been being treated by PCP and orthopedist, HLD, PAF, hypothyroid, SSS with pacemaker, and mild dementia who presents after a fall. Admitted with sepsis.  RD working remotely. Attempted to call patient's room phone. Pt did not answer.  No meal intakes documented in Epic at this time.  Per Epic, pt has lost ~5 lbs (5.2%) in the last week, which is significant and severe for the time frame.  RD suspects patient is malnourished to some degree, but cannot definitively diagnose at this time.  Recommend Ensure BID, Juven BID, and MVI with minerals daily.  Medications: reviewed; Synthroid  Labs: reviewed; Glucose 66 (L)  NUTRITION - FOCUSED PHYSICAL EXAM: Unable to perform - defer to follow-up  Diet Order:   Diet Order             Diet Heart Room service appropriate? Yes; Fluid  consistency: Thin  Diet effective now                  EDUCATION NEEDS:  Education needs have been addressed  Skin:  Skin Assessment: Skin Integrity Issues: Skin Integrity Issues:: Other (Comment) Other: Venous stasis ulcer on L buttocks and chronic wound on L foot  Last BM:  02/21/21 - x2, Type 7, medium  Height:  Ht Readings from Last 1 Encounters:  02/20/21 5\' 1"  (1.549 m)   Weight:  Wt Readings from Last 1 Encounters:  02/21/21 43.6 kg   BMI:  Body mass index is 18.16 kg/m.  Estimated Nutritional Needs:  Kcal:  1400-1600 Protein:  60-75 grams Fluid:  >1.4 L  02/23/21, RD, LDN (she/her/hers) Clinical Inpatient Dietitian RD Pager/After-Hours/Weekend Pager # in [place]

## 2021-02-21 NOTE — Progress Notes (Signed)
Heart Failure Navigator Progress Note ° °Assessed for Heart & Vascular TOC clinic readiness.  °Patient does not meet criteria due to dementia.  ° °Navigator available for reassessment of patient.  ° °Domnique Vanegas, MSN, RN °Heart Failure Nurse Navigator °[phone] ° ° °

## 2021-02-21 NOTE — Progress Notes (Signed)
ANTICOAGULATION CONSULT NOTE   Pharmacy Consult for IV heparin Indication: atrial fibrillation  No Known Allergies  Patient Measurements: Height: 5\' 1"  (154.9 cm) Weight: 46 kg (101 lb 6.4 oz) IBW/kg (Calculated) : 47.8 Heparin Dosing Weight: 46 kg  Vital Signs: Temp: 97.7 F (36.5 C) (11/22 2354) Temp Source: Oral (11/22 2354) BP: 92/53 (11/22 2354) Pulse Rate: 58 (11/22 2354)  Labs: Recent Labs    02/20/21 0947 02/20/21 1021 02/20/21 1147 02/20/21 1229 02/20/21 1537 02/20/21 2224  HGB 17.1* 18.0*  --   --   --   --   HCT 50.9* 53.0*  --   --   --   --   PLT 106*  --   --   --   --   --   APTT  --   --   --   --   --  62*  LABPROT  --   --  17.5* 17.4*  --   --   INR  --   --  1.4* 1.4*  --   --   HEPARINUNFRC  --   --   --   --   --  0.10*  CREATININE 1.20* 0.90  --   --   --   --   CKTOTAL 977*  --   --   --   --   --   TROPONINIHS  --   --   --   --  33*  --      Estimated Creatinine Clearance: 27.8 mL/min (by C-G formula based on SCr of 0.9 mg/dL).   Medical History: Past Medical History:  Diagnosis Date   Atrioventricular block, complete (HCC) 2006   Hyperlipidemia    Hypertension    Paroxysmal atrial fibrillation (HCC) 2016   single episode detected on PPM interrogation    Assessment: 87 yo F with presented on 11/22 with wound on L foot and concern for sepsis. PTA on eliquis for afib. LKD 11/21 at 0900. Pharmacy consulted to start IV heparin.   11/23 AM update:  Heparin level and aPTT low No need for further aPTT's  Goal of Therapy:  Heparin level 0.3-0.7 units/ml Monitor platelets by anticoagulation protocol: Yes   Plan:  Inc heparin to 750 units/hr Re-check heparin level in 8 hours  12/23, PharmD, BCPS Clinical Pharmacist Phone: [phone]

## 2021-02-21 NOTE — Evaluation (Signed)
Occupational Therapy Evaluation Patient Details Name: [name] [name] MRN: [id number] DOB: 29-Nov-1924 Today's Date: 02/21/2021   History of Present Illness Pt is a 87 year old woman admitted after falling. + sepsis PMH: non healing wound on dorsum of L foot sustained when walking in her yard barefoot and encountering yellow jackets, has been being treated by PCP and orthopedist, HLD, PAF, hypothyroid, SSS with pacemaker, mild dementia.   Clinical Impression   Pt was living alone and ambulating with a RW prior to admission. Her children check on her during the day and evening. Pt sponge bathes and is independent in self care. Her daughter was assisting with meals due to pt's recent poor appetite. Pt presents with generalized weakness and impaired dynamic standing balance. Pt was able to provide accurate home set up and level of functioning with exception of no longer driving. Pt requires set up to total assist for ADL and min to min guard assist for mobility. Recommending home with [place], may need updating depending on orthopedist's recommendations. Will follow acutely.      Recommendations for follow up therapy are one component of a multi-disciplinary discharge planning process, led by the attending physician.  Recommendations may be updated based on patient status, additional functional criteria and insurance authorization.   Follow Up Recommendations  Home health OT    Assistance Recommended at Discharge Intermittent Supervision/Assistance  Functional Status Assessment  Patient has had a recent decline in their functional status and demonstrates the ability to make significant improvements in function in a reasonable and predictable amount of time.  Equipment Recommendations  None recommended by OT    Recommendations for Other Services       Precautions / Restrictions Precautions Precautions: Fall Restrictions Weight Bearing Restrictions: No      Mobility Bed Mobility Overal bed  mobility: Needs Assistance Bed Mobility: Supine to Sit     Supine to sit: Min assist     General bed mobility comments: assist to raise trunk pulling up on therapist's hand, increased time, HOB up    Transfers Overall transfer level: Needs assistance Equipment used: Rolling walker (2 wheels) Transfers: Sit to/from Stand Sit to Stand: Min assist;Min guard           General transfer comment: min guard from bed, min from [facility] to rise      Balance Overall balance assessment: Needs assistance   Sitting balance-Leahy Scale: Good     Standing balance support: During functional activity;Reliant on assistive device for balance;No upper extremity supported Standing balance-Leahy Scale: Fair Standing balance comment: fair statically, reliant on RW for dynamic balance                           ADL either performed or assessed with clinical judgement   ADL Overall ADL's : Needs assistance/impaired Eating/Feeding: Independent;Sitting   Grooming: Wash/dry hands;Standing;Min guard   Upper Body Bathing: Set up;Sitting   Lower Body Bathing: Maximal assistance;Sit to/from stand   Upper Body Dressing : Minimal assistance;Sitting   Lower Body Dressing: Total assistance;Bed level Lower Body Dressing Details (indicate cue type and reason): socks Toilet Transfer: Min guard;Stand-pivot;Rolling walker (2 wheels)   Toileting- Clothing Manipulation and Hygiene: Total assistance;Sit to/from stand       Functional mobility during ADLs: Min guard;Rolling walker (2 wheels)       Vision Ability to See in Adequate Light: 0 Adequate Patient Visual Report: No change from baseline  Perception     Praxis      Pertinent Vitals/Pain Pain Assessment: No/denies pain     Hand Dominance Right   Extremity/Trunk Assessment Upper Extremity Assessment Upper Extremity Assessment: Overall WFL for tasks assessed   Lower Extremity Assessment Lower Extremity Assessment: Defer  to PT evaluation   Cervical / Trunk Assessment Cervical / Trunk Assessment: Kyphotic   Communication Communication Communication: HOH   Cognition Arousal/Alertness: Awake/alert Behavior During Therapy: WFL for tasks assessed/performed Overall Cognitive Status: History of cognitive impairments - at baseline                                 General Comments: hx of dementia, likely baseline, pleasant     General Comments       Exercises     Shoulder Instructions      Home Living Family/patient expects to be discharged to:: Private residence Living Arrangements: Alone Available Help at Discharge: Family;Available PRN/intermittently Type of Home: House Home Access: Stairs to enter [employer] of Steps: 3 Entrance Stairs-Rails: None Home Layout: One level;Laundry or work area in basement (does not go to basement)     Bathroom Shower/Tub: Tub/shower unit (sponge bathes)   Bathroom Toilet: Standard     Home Equipment: Agricultural consultant (2 wheels)   Additional Comments: family checks on her 2x per day, son is retired if she needs 24 hour care, daughter is employed      Prior Functioning/Environment Prior Level of Function : Independent/Modified Independent             Mobility Comments: walks with RW ADLs Comments: sponge bathes, likes to be outside, does not drive        OT Problem List: Impaired balance (sitting and/or standing);Decreased knowledge of use of DME or AE;Decreased cognition      OT Treatment/Interventions: Self-care/ADL training;DME and/or AE instruction;Patient/family education;Balance training;Therapeutic activities    OT Goals(Current goals can be found in the care plan section) Acute Rehab OT Goals OT Goal Formulation: With patient Time For Goal Achievement: 03/07/21 Potential to Achieve Goals: Good ADL Goals Pt Will Perform Grooming: standing;with modified independence Pt Will Perform Lower Body Bathing: with  modified independence;sit to/from stand Pt Will Perform Lower Body Dressing: with modified independence;sit to/from stand Pt Will Transfer to Toilet: with modified independence;ambulating;regular height toilet Pt Will Perform Toileting - Clothing Manipulation and hygiene: with modified independence;sit to/from stand  OT Frequency: Min 2X/week   Barriers to D/C:            Co-evaluation              AM-PAC OT "6 Clicks" Daily Activity     Outcome Measure Help from another person eating meals?: None Help from another person taking care of personal grooming?: A Little Help from another person toileting, which includes using toliet, bedpan, or urinal?: A Lot Help from another person bathing (including washing, rinsing, drying)?: A Lot Help from another person to put on and taking off regular upper body clothing?: A Little Help from another person to put on and taking off regular lower body clothing?: Total 6 Click Score: 15   End of Session Equipment Utilized During Treatment: Rolling walker (2 wheels) Nurse Communication: Mobility status;Other (comment) (ok to have Ensure)  Activity Tolerance: Patient tolerated treatment well Patient left: in chair;with call bell/phone within reach;with chair alarm set  OT Visit Diagnosis: Unsteadiness on feet (R26.81);Other abnormalities of gait and mobility (  R26.89);Muscle weakness (generalized) (M62.81);Other symptoms and signs involving cognitive function                Time: [id number] OT Time Calculation (min): 26 min Charges:  OT General Charges $OT Visit: 1 Visit OT Evaluation $OT Eval Moderate Complexity: 1 Mod  Nestor Lewandowsky, OTR/L Acute Rehabilitation Services Pager: [phone] Office: [phone]   Malka So 02/21/2021, 1:14 PM

## 2021-02-21 NOTE — Progress Notes (Signed)
PROGRESS NOTE  [name] [name] [id number] DOB: 11-29-1924 DOA: 02/20/2021 PCP: Janora Norlander, DO  HPI/Recap of past 24 hours: [name] [name] is a 87 y.o. female with medical history significant of left foot nonhealing wound x2 weeks, PAF on Eliquis, SSS status post PPM, HTN, HLD, dementia, presented with fall at home.   Patient baseline demented, most history provided by patient daughter at bedside.  Daughter reported that patient sustained a open wound of left dorsal foot about 2 weeks ago, then the bone and infected, for which patient completed 7 days of Augmentin and had outpatient debridement 5 days ago with Dr. Sharol Given.  And patient has been taking doxycycline for last 4 days.  Patient was last seen normal on Sunday no complaints.  This morning, patient was found on the floor and complained about left hip pain and unable to get up herself.    Patient was also found to be fluid overload, chest x-ray showed bilateral pleural effusion, his exam showed 2+ bilateral pitting edema, daughter reported the patient's legs "are always this big and puffy".   ED Course: Patient was found hypothermia temperature 93.5, blood pressure borderline, no tachycardia, patient was started on Bair hugger, CT head negative for bleeding rib fracture, rib x-ray inconclusive, blood work WBC 15.9, creatinine 1.2, bicarb 13, lactic acid 4.6.   Patient was started on vancomycin and Zosyn.  02/21/21:  Patient was seen and examined at her bedside.  She has no new complaints.  She denies pain.   Assessment/Plan: Principal Problem:   Sepsis ([place])  Sepsis -Evidenced by hypothermia, new leukocytosis, elevated lactate, signs of endorgan damage of acute metabolic encephalopathy/confusion and AKI, source is left foot deep wound infection, clinically compatible with osteomyelitis with bone exposed. -Outpatient superficial culture shows Pseudomonas on 11/17 -Continue vancomycin and start cefepime -Dr. Sharol Given  consulted. -No Hx of PVD, daughter reported that patient baseline active, no complaints of leg pains shortness of breath, but will check ABI. -Given the signs of fluid overload, will switch from crystalloid to colloid for hydration. Follow blood cultures. Obtain MRSA screening test   Acute systolic CHF with decompensation -Daughter reported patient at baseline active, no complaint of chest pain or shortness of breath with moderate activity. -2D echo done on 02/21/2021 showed LVEF 25 to 30%.  The left ventricle has no regional wall motion abnormalities.  Small mobile echodensity adherent to pacemaker lead in right atrium could represent thrombus or vegetation. -Consider Lasix once patient out of sepsis.  Abnormal right atrium could represent thrombus or vegetation Closely follow blood cultures taken on 02/20/2021. Continue cefepime and IV vancomycin for now   AKI -Probably multifactorial from sepsis and CHF decompensation Closely monitor urinary output with strict I's and O's   Transaminitis -No RUQ tenderness, likely secondary to decompensation of, and or mild rhabdomyolysis -Hydration with albumin, trend CK level.  And trend LFTs tomorrow.   Left hip pain -Pelvic x-ray nonconclusive, will check hip CT scan rule out fractures. -PT evaluation if CT negative for fracture   A. fib, paroxysmal -Blood pressure borderline low, change rate controlled as needed metoprolol hold Cardizem p.o. -Continue Heparin drip for now in [place] patient will go to the OR.   HTN -Hold HCTZ   Mild rhabdomyolysis Received hydration Currently off IV fluid.   Moderate protein calorie malnutrition -Consult nutrition   DVT prophylaxis: Heparin drip Code Status: Full code, son will bring in her living will Family Communication: None at bedside. Disposition Plan: Expect more than  2 midnight hospital stay Consults called: Dr. Sharol Given Admission status: Tele admit      Objective: Vitals:   02/21/21 0413  02/21/21 0736 02/21/21 1133 02/21/21 1555  BP: (!) 95/59 (!) 95/51 105/67 100/65  Pulse: 64 61 61 (!) 59  Resp: 16 19 17 18   Temp: (!) 97.4 F (36.3 C) 98.1 F (36.7 C) (!) 97.5 F (36.4 C) 98.1 F (36.7 C)  TempSrc: Oral  Oral   SpO2: 99% 100% 100%   Weight: 43.6 kg     Height:        Intake/Output Summary (Last 24 hours) at 02/21/2021 1559 Last data filed at 02/21/2021 1209 Gross per 24 hour  Intake 1013.33 ml  Output --  Net 1013.33 ml   Filed Weights   02/20/21 1012 02/21/21 0413  Weight: 46 kg 43.6 kg    Exam:  General: 87 y.o. year-old female frail-appearing in no acute distress.  Alert and pleasantly demented. Cardiovascular: Regular rate and rhythm with no rubs or gallops.  No thyromegaly or JVD noted.   Respiratory: Clear to auscultation with no wheezes or rales. Good inspiratory effort. Abdomen: Soft nontender nondistended with normal bowel sounds x4 quadrants. Musculoskeletal: No lower extremity edema. 2/4 pulses in all 4 extremities. Skin:  Psychiatry: Mood is appropriate for condition and setting   Data Reviewed: CBC: Recent Labs  Lab 02/20/21 0947 02/20/21 1021 02/21/21 0324  WBC 15.9*  --  8.2  NEUTROABS 14.3*  --   --   HGB 17.1* 18.0* 13.1  HCT 50.9* 53.0* 37.7  MCV 100.8*  --  96.7  PLT 106*  --  89*   Basic Metabolic Panel: Recent Labs  Lab 02/20/21 0947 02/20/21 1021 02/21/21 0324  NA 136 134* 137  K 4.5 4.4 3.7  CL 104 108 107  CO2 13*  --  18*  GLUCOSE 96 96 66*  BUN 35* 38* 33*  CREATININE 1.20* 0.90 1.12*  CALCIUM 10.0  --  9.6   GFR: Estimated Creatinine Clearance: 21.1 mL/min (A) (by C-G formula based on SCr of 1.12 mg/dL (H)). Liver Function Tests: Recent Labs  Lab 02/20/21 0947 02/21/21 0324  AST 88* 70*  ALT 58* 39  ALKPHOS 101 72  BILITOT 1.7* 1.9*  PROT 6.3* 5.8*  ALBUMIN 3.3* 3.9   No results for input(s): LIPASE, AMYLASE in the last 168 hours. No results for input(s): AMMONIA in the last 168  hours. Coagulation Profile: Recent Labs  Lab 02/20/21 1147 02/20/21 1229  INR 1.4* 1.4*   Cardiac Enzymes: Recent Labs  Lab 02/20/21 0947 02/21/21 0324  CKTOTAL 977* 481*   BNP (last 3 results) No results for input(s): PROBNP in the last 8760 hours. HbA1C: No results for input(s): HGBA1C in the last 72 hours. CBG: No results for input(s): GLUCAP in the last 168 hours. Lipid Profile: No results for input(s): CHOL, HDL, LDLCALC, TRIG, CHOLHDL, LDLDIRECT in the last 72 hours. Thyroid Function Tests: Recent Labs    02/20/21 1537  TSH 6.505*   Anemia Panel: No results for input(s): VITAMINB12, FOLATE, FERRITIN, TIBC, IRON, RETICCTPCT in the last 72 hours. Urine analysis: No results found for: COLORURINE, APPEARANCEUR, LABSPEC, PHURINE, GLUCOSEU, HGBUR, BILIRUBINUR, KETONESUR, PROTEINUR, UROBILINOGEN, NITRITE, LEUKOCYTESUR Sepsis Labs: @LABRCNTIP (procalcitonin:4,lacticidven:4)  ) Recent Results (from the past 240 hour(s))  Anaerobic and Aerobic Culture     Status: Abnormal   Collection Time: 02/14/21  4:46 PM   Specimen: Foot; Other   FO  Result Value Ref Range Status   Anaerobic  Culture Final report  Final   Result 1 Comment  Final    Comment: No anaerobic growth in 72 hours.   Aerobic Culture Final report (A)  Final   Result 1 Pseudomonas putida (A)  Final    Comment: Heavy growth   Result 2 Mixed skin flora  Final    Comment: Light growth   Antimicrobial Susceptibility Comment  Final    Comment:       ** S = Susceptible; I = Intermediate; R = Resistant **                    P = Positive; N = Negative             MICS are expressed in micrograms per mL    Antibiotic                 RSLT#1    RSLT#2    RSLT#3    RSLT#4 Amikacin                       S Aztreonam                      I Cefepime                       S Cefotaxime                     S Ceftazidime                    S Ceftriaxone                    S Ciprofloxacin                  S Gentamicin                      S Imipenem                       S Levofloxacin                   S Meropenem                      S Piperacillin/Tazobactam        S Ticarcillin/Clavulanate        I Tobramycin                     S Trimethoprim/Sulfa             R   Wound culture     Status: Abnormal   Collection Time: 02/15/21  3:45 PM   Specimen: Wound  Result Value Ref Range Status   MICRO NUMBER: [id number]  Final   SPECIMEN QUALITY: Adequate  Final   SOURCE: FOOT, LEFT  Final   STATUS: FINAL  Final   GRAM STAIN:   Final    Few White blood cells seen No epithelial cells seen Moderate Gram negative bacilli   ISOLATE 1: Pseudomonas putida (A)  Final    Comment: Heavy growth of Pseudomonas putida      Susceptibility   Pseudomonas putida - AEROBIC CULT, GRAM STAIN NEGATIVE 1    CEFTAZIDIME 2 Sensitive     CEFEPIME <=1 Sensitive     CIPROFLOXACIN <=0.25 Sensitive  LEVOFLOXACIN 1 Sensitive     GENTAMICIN <=1 Sensitive     PIP/TAZO 8 Sensitive     TOBRAMYCIN* <=1 Sensitive      * Legend: S = Susceptible  I = Intermediate R = Resistant  NS = Not susceptible * = Not tested  NR = Not reported **NN = See antimicrobic comments   Culture, blood (Routine x 2)     Status: None (Preliminary result)   Collection Time: 02/20/21 10:25 AM   Specimen: BLOOD  Result Value Ref Range Status   Specimen Description BLOOD LEFT ANTECUBITAL  Final   Special Requests   Final    BOTTLES DRAWN AEROBIC AND ANAEROBIC Blood Culture adequate volume   Culture   Final    NO GROWTH < 24 HOURS Performed at [facility], [place] [address], [place], [place] [address]    Report Status PENDING  Incomplete  Culture, blood (Routine x 2)     Status: None (Preliminary result)   Collection Time: 02/20/21 10:36 AM   Specimen: BLOOD  Result Value Ref Range Status   Specimen Description BLOOD RIGHT ANTECUBITAL  Final   Special Requests   Final    BOTTLES DRAWN AEROBIC AND ANAEROBIC Blood Culture results may not be  optimal due to an inadequate volume of blood received in culture bottles   Culture   Final    NO GROWTH < 24 HOURS Performed at [facility], [place] [address] [place], [place] [address]    Report Status PENDING  Incomplete  Resp Panel by RT-PCR (Flu A&B, Covid) Nasopharyngeal Swab     Status: None   Collection Time: 02/20/21  3:34 PM   Specimen: Nasopharyngeal Swab; Nasopharyngeal(NP) swabs in vial transport medium  Result Value Ref Range Status   SARS Coronavirus 2 by RT PCR NEGATIVE NEGATIVE Final    Comment: (NOTE) SARS-CoV-2 target nucleic acids are NOT DETECTED.  The SARS-CoV-2 RNA is generally detectable in upper respiratory specimens during the acute phase of infection. The lowest concentration of SARS-CoV-2 viral copies this assay can detect is 138 copies/mL. A negative result does not preclude SARS-Cov-2 infection and should not be used as the sole basis for treatment or other patient management decisions. A negative result may occur with  improper specimen collection/handling, submission of specimen other than nasopharyngeal swab, presence of viral mutation(s) within the areas targeted by this assay, and inadequate number of viral copies(<138 copies/mL). A negative result must be combined with clinical observations, patient history, and epidemiological information. The expected result is Negative.  Fact Sheet for Patients:  EntrepreneurPulse.com.au  Fact Sheet for Healthcare Providers:  IncredibleEmployment.be  This test is no t yet approved or cleared by the [place] FDA and  has been authorized for detection and/or diagnosis of SARS-CoV-2 by FDA under an Emergency Use Authorization (EUA). This EUA will remain  in effect (meaning this test can be used) for the duration of the COVID-19 declaration under Section 564(b)(1) of the Act, 21 U.S.C.section 360bbb-3(b)(1), unless the authorization is terminated  or revoked sooner.        Influenza A by PCR NEGATIVE NEGATIVE Final   Influenza B by PCR NEGATIVE NEGATIVE Final    Comment: (NOTE) The Xpert Xpress SARS-CoV-2/FLU/RSV plus assay is intended as an aid in the diagnosis of influenza from Nasopharyngeal swab specimens and should not be used as a sole basis for treatment. Nasal washings and aspirates are unacceptable for Xpert Xpress SARS-CoV-2/FLU/RSV testing.  Fact Sheet for Patients: EntrepreneurPulse.com.au  Fact Sheet for Healthcare Providers: IncredibleEmployment.be  This test is not yet approved or cleared by the [place] FDA and has been authorized for detection and/or diagnosis of SARS-CoV-2 by FDA under an Emergency Use Authorization (EUA). This EUA will remain in effect (meaning this test can be used) for the duration of the COVID-19 declaration under Section 564(b)(1) of the Act, 21 U.S.C. section 360bbb-3(b)(1), unless the authorization is terminated or revoked.  Performed at [facility], [place] [address] [place], [place] [address]       Studies: VAS [place] ABI WITH/WO TBI  Result Date: 02/21/2021  LOWER EXTREMITY DOPPLER STUDY Patient Name:  Klhoe Bianco Devoto  Date of Exam:   02/21/2021 Medical Rec #: [id number]       Accession #:    [id number] Date of Birth: 1924-05-18       Patient Gender: F Patient Age:   22 years Exam Location:  [facility] Procedure:      VAS [place] ABI WITH/WO TBI Referring Phys: Wynetta Fines --------------------------------------------------------------------------------  Indications: Non-healing wound on dorsum of LT foot. High Risk Factors: Hypertension, hyperlipidemia.  Limitations: Today's exam was limited due to an open wound and bandages. Comparison Study: No prior studies. Performing Technologist: Darlin Coco RDMS RVT  Examination Guidelines: A complete evaluation includes at minimum, Doppler waveform signals and systolic blood pressure reading at the level of  bilateral brachial, anterior tibial, and posterior tibial arteries, when vessel segments are accessible. Bilateral testing is considered an integral part of a complete examination. Photoelectric Plethysmograph (PPG) waveforms and toe systolic pressure readings are included as required and additional duplex testing as needed. Limited examinations for reoccurring indications may be performed as noted.  ABI Findings: +---------+------------------+-----+----------+--------+ Right    Rt Pressure (mmHg)IndexWaveform  Comment  +---------+------------------+-----+----------+--------+ Brachial 105                                       +---------+------------------+-----+----------+--------+ PTA      123               1.16 monophasic         +---------+------------------+-----+----------+--------+ DP       121               1.14 biphasic           +---------+------------------+-----+----------+--------+ Great Toe66                0.62 Abnormal           +---------+------------------+-----+----------+--------+ +---------+------------------+-----+--------+-------+ Left     Lt Pressure (mmHg)IndexWaveformComment +---------+------------------+-----+--------+-------+ Brachial 106                                    +---------+------------------+-----+--------+-------+ PTA      154               1.45 biphasic        +---------+------------------+-----+--------+-------+ DP       106               1.00 biphasic        +---------+------------------+-----+--------+-------+ Great Toe78                0.74 Normal          +---------+------------------+-----+--------+-------+ +-------+-----------+-----------+------------+------------+ ABI/TBIToday's ABIToday's TBIPrevious ABIPrevious TBI +-------+-----------+-----------+------------+------------+ Right  1.16  0.64                                +-------+-----------+-----------+------------+------------+ Left    1.45       0.74                                +-------+-----------+-----------+------------+------------+  Arterial wall calcification precludes accurate ankle pressures and ABIs.  Summary: Right: Resting right ankle-brachial index is within normal range. No evidence of significant right lower extremity arterial disease. The right toe-brachial index is abnormal. Although ankle brachial indices are within normal limits (0.95-1.29), arterial Doppler waveforms at the ankle suggest some component of arterial occlusive disease.  Left: Resting left ankle-brachial index indicates noncompressible left lower extremity arteries. The left toe-brachial index is normal.  *See table(s) above for measurements and observations.     Preliminary    ECHOCARDIOGRAM COMPLETE  Result Date: 02/21/2021    ECHOCARDIOGRAM REPORT   Patient Name:   ALVERNA STUEWE Fiallos Date of Exam: 02/21/2021 Medical Rec #:  [id number]      Height:       61.0 in Accession #:    [id number]     Weight:       96.1 lb Date of Birth:  10-Mar-1925      BSA:          1.383 m Patient Age:    89 years       BP:           95/61 mmHg Patient Gender: F              HR:           61 bpm. Exam Location:  Inpatient Procedure: 2D Echo, 3D Echo, Cardiac Doppler, Color Doppler and Intracardiac            Opacification Agent  Results communicated to Dr Nevada Crane at 13:13 on 02/21/21. Indications:    I50.40* Unspecified combined systolic (congestive) and diastolic                 (congestive) heart failure  History:        Patient has no prior history of Echocardiogram examinations.                 Pacemaker and Abnormal ECG, Arrythmias:Atrial Fibrillation,                 Signs/Symptoms:Altered Mental Status and Alzheimer's; Risk                 Factors:Hypertension and Dyslipidemia.  Sonographer:    Roseanna Rainbow RDCS Referring Phys: [id number] [place]  1. Left ventricular ejection fraction, by estimation, is 25 to 30%. The left ventricle has severely decreased  function. The left ventricle has no regional wall motion abnormalities. The left ventricular internal cavity size was mildly dilated. There is mild left ventricular hypertrophy. Left ventricular diastolic parameters are indeterminate.  2. Right ventricular systolic function is mildly reduced. The right ventricular size is moderately enlarged. There is mildly elevated pulmonary artery systolic pressure. The estimated right ventricular systolic pressure is [id number] mmHg.  3. Small mobil echodensity adherent to pacemaker lead in right atrium (image 30), could represent thrombus or vegetation, would recommend checking blood cultures  4. Left atrial size was severely dilated.  5. Right atrial size was severely dilated.  6. The mitral valve is  abnormal. Severe mitral valve regurgitation. Appears functional. No evidence of mitral stenosis.  7. Tricuspid valve regurgitation is severe. Hepatic vein systolic flow reversal.  8. The aortic valve is tricuspid. Aortic valve regurgitation is not visualized. Aortic valve sclerosis is present, with no evidence of aortic valve stenosis.  9. The inferior vena cava is dilated in size with <50% respiratory variability, suggesting right atrial pressure of 15 mmHg. FINDINGS  Left Ventricle: Left ventricular ejection fraction, by estimation, is 25 to 30%. The left ventricle has severely decreased function. The left ventricle has no regional wall motion abnormalities. The left ventricular internal cavity size was mildly dilated. There is mild left ventricular hypertrophy. Left ventricular diastolic parameters are indeterminate. Right Ventricle: The right ventricular size is moderately enlarged. Right vetricular wall thickness was not well visualized. Right ventricular systolic function is mildly reduced. There is mildly elevated pulmonary artery systolic pressure. The tricuspid  regurgitant velocity is 2.50 m/s, and with an assumed right atrial pressure of 15 mmHg, the estimated right  ventricular systolic pressure is 40.0 mmHg. Left Atrium: Left atrial size was severely dilated. Right Atrium: Right atrial size was severely dilated. Pericardium: There is no evidence of pericardial effusion. Mitral Valve: The mitral valve is abnormal. Severe mitral valve regurgitation. No evidence of mitral valve stenosis. Tricuspid Valve: The tricuspid valve is normal in structure. Tricuspid valve regurgitation is severe. Aortic Valve: The aortic valve is tricuspid. Aortic valve regurgitation is not visualized. Aortic valve sclerosis is present, with no evidence of aortic valve stenosis. Pulmonic Valve: The pulmonic valve was not well visualized. Pulmonic valve regurgitation is mild. Aorta: The aortic root and ascending aorta are structurally normal, with no evidence of dilitation. Venous: The inferior vena cava is dilated in size with less than 50% respiratory variability, suggesting right atrial pressure of 15 mmHg. IAS/Shunts: The interatrial septum was not well visualized. Additional Comments: There is a small pleural effusion in the left lateral region.  LEFT VENTRICLE PLAX 2D LVIDd:         5.10 cm      Diastology LVIDs:         4.60 cm      LV e' medial:    3.75 cm/s LV PW:         0.90 cm      LV E/e' medial:  29.9 LV IVS:        1.00 cm      LV e' lateral:   4.12 cm/s LVOT diam:     1.80 cm      LV E/e' lateral: 27.2 LV SV:         37 LV SV Index:   27 LVOT Area:     2.54 cm                              3D Volume EF: LV Volumes (MOD)            3D EF:        28 % LV vol d, MOD A2C: 134.0 ml LV EDV:       143 ml LV vol d, MOD A4C: 110.0 ml LV ESV:       103 ml LV vol s, MOD A2C: 100.0 ml LV SV:        40 ml LV vol s, MOD A4C: 90.0 ml LV SV MOD A2C:     34.0 ml LV SV MOD A4C:  110.0 ml LV SV MOD BP:      28.0 ml RIGHT VENTRICLE            IVC RV S prime:     8.74 cm/s  IVC diam: 2.30 cm TAPSE (M-mode): 1.6 cm LEFT ATRIUM             Index        RIGHT ATRIUM           Index LA diam:        4.40 cm 3.18  cm/m   RA Area:     26.80 cm LA Vol (A2C):   99.1 ml 71.64 ml/m  RA Volume:   83.90 ml  60.65 ml/m LA Vol (A4C):   77.7 ml 56.17 ml/m LA Biplane Vol: 87.4 ml 63.18 ml/m  AORTIC VALVE             PULMONIC VALVE LVOT Vmax:   87.80 cm/s  PR End Diast Vel: 2.55 msec LVOT Vmean:  51.100 cm/s LVOT VTI:    0.145 m  AORTA Ao Root diam: 3.00 cm Ao Asc diam:  3.10 cm MITRAL VALVE                  TRICUSPID VALVE MV Area (PHT): 4.04 cm       TR Peak grad:   25.0 mmHg MV Decel Time: 188 msec       TR Vmax:        250.00 cm/s MR Peak grad:    63.0 mmHg MR Mean grad:    38.0 mmHg    SHUNTS MR Vmax:         397.00 cm/s  Systemic VTI:  0.14 m MR Vmean:        283.0 cm/s   Systemic Diam: 1.80 cm MR PISA:         1.90 cm MR PISA Eff ROA: 15 mm MR PISA Radius:  0.55 cm MV E velocity: 112.00 cm/s MV A velocity: 36.20 cm/s MV E/A ratio:  3.09 Oswaldo Milian MD Electronically signed by Oswaldo Milian MD Signature Date/Time: 02/21/2021/1:14:28 PM    Final     Scheduled Meds:  feeding supplement  237 mL Oral BID BM   levothyroxine  75 mcg Oral QAC breakfast   memantine  5 mg Oral QHS   multivitamin with minerals  1 tablet Oral Daily   nutrition supplement (JUVEN)  1 packet Oral BID BM    Continuous Infusions:  sodium chloride Stopped (02/20/21 2316)   ceFEPime (MAXIPIME) IV 2 g (02/21/21 0901)   heparin 750 Units/hr (02/21/21 0402)   [START ON 02/22/2021] vancomycin       LOS: 1 day     Kayleen Memos, MD [facility] Pager [phone]  If 7PM-7AM, please contact night-coverage [email or web address] Password [facility] 02/21/2021, 3:59 PM

## 2021-02-22 DIAGNOSIS — G9340 Encephalopathy, unspecified: Secondary | ICD-10-CM

## 2021-02-22 DIAGNOSIS — L97522 Non-pressure chronic ulcer of other part of left foot with fat layer exposed: Secondary | ICD-10-CM | POA: Diagnosis not present

## 2021-02-22 DIAGNOSIS — R6520 Severe sepsis without septic shock: Secondary | ICD-10-CM

## 2021-02-22 DIAGNOSIS — A419 Sepsis, unspecified organism: Principal | ICD-10-CM

## 2021-02-22 DIAGNOSIS — R209 Unspecified disturbances of skin sensation: Secondary | ICD-10-CM

## 2021-02-22 DIAGNOSIS — R296 Repeated falls: Secondary | ICD-10-CM

## 2021-02-22 LAB — BASIC METABOLIC PANEL
Anion gap: 9 (ref 5–15)
BUN: 46 mg/dL — ABNORMAL HIGH (ref 8–23)
CO2: 19 mmol/L — ABNORMAL LOW (ref 22–32)
Calcium: 9.8 mg/dL (ref 8.9–10.3)
Chloride: 109 mmol/L (ref 98–111)
Creatinine, Ser: 0.89 mg/dL (ref 0.44–1.00)
GFR, Estimated: >60 mL/min (ref >60)
Glucose, Bld: 104 mg/dL — ABNORMAL HIGH (ref 70–99)
Potassium: 3.7 mmol/L (ref 3.5–5.1)
Sodium: 137 mmol/L (ref 135–145)

## 2021-02-22 LAB — HEPATIC FUNCTION PANEL
ALT: 36 U/L (ref 0–44)
AST: 55 U/L — ABNORMAL HIGH (ref 15–41)
Albumin: 3.4 g/dL — ABNORMAL LOW (ref 3.5–5.0)
Alkaline Phosphatase: 73 U/L (ref 38–126)
Bilirubin, Direct: 0.5 mg/dL — ABNORMAL HIGH (ref 0.0–0.2)
Indirect Bilirubin: 0.7 mg/dL (ref 0.3–0.9)
Total Bilirubin: 1.2 mg/dL (ref 0.3–1.2)
Total Protein: 5.3 g/dL — ABNORMAL LOW (ref 6.5–8.1)

## 2021-02-22 LAB — CBC
HCT: 40.5 % (ref 36.0–46.0)
Hemoglobin: 14.1 g/dL (ref 12.0–15.0)
MCH: 33.7 pg (ref 26.0–34.0)
MCHC: 34.8 g/dL (ref 30.0–36.0)
MCV: 96.9 fL (ref 80.0–100.0)
Platelets: 78 10*3/uL — ABNORMAL LOW (ref 150–400)
RBC: 4.18 MIL/uL (ref 3.87–5.11)
RDW: 17.7 % — ABNORMAL HIGH (ref 11.5–15.5)
WBC: 8.8 10*3/uL (ref 4.0–10.5)
nRBC: 0 % (ref 0.0–0.2)

## 2021-02-22 LAB — HEPARIN LEVEL (UNFRACTIONATED): Heparin Unfractionated: 0.7 IU/mL (ref 0.30–0.70)

## 2021-02-22 LAB — MRSA NEXT GEN BY PCR, NASAL: MRSA by PCR Next Gen: NOT DETECTED

## 2021-02-22 MED ORDER — SODIUM CHLORIDE 0.9 % IV SOLN: 2.0000 g | Freq: Two times a day (BID) | INTRAVENOUS | Status: DC

## 2021-02-22 MED ORDER — SODIUM BICARBONATE 650 MG PO TABS
1300.0000 mg | ORAL_TABLET | Freq: Two times a day (BID) | ORAL | Status: AC
  Administered 2021-02-22 – 2021-02-23 (×4): 1300 mg via ORAL
  Filled 2021-02-22 (×4): qty 2

## 2021-02-22 MED ORDER — POTASSIUM CHLORIDE CRYS ER 20 MEQ PO TBCR
40.0000 meq | EXTENDED_RELEASE_TABLET | Freq: Once | ORAL | Status: AC
  Administered 2021-02-22: 40 meq via ORAL
  Filled 2021-02-22: qty 2

## 2021-02-22 MED ORDER — CEFEPIME HCL 2 G IJ SOLR
2.0000 g | INTRAMUSCULAR | Status: DC
Start: 2021-02-22 — End: 2021-02-23

## 2021-02-22 MED ORDER — SILVER SULFADIAZINE 1 % EX CREA
TOPICAL_CREAM | Freq: Every day | CUTANEOUS | Status: DC
  Filled 2021-02-22: qty 85

## 2021-02-22 NOTE — Progress Notes (Signed)
Mobility Specialist Progress Note:   02/22/21 1135  Mobility  Activity Ambulated in hall  Level of Assistance Minimal assist, patient does 75% or more  Assistive Device Front wheel walker  Distance Ambulated (ft) 50 ft  Mobility Ambulated with assistance in hallway  Mobility Response Tolerated well  Mobility performed by Mobility specialist  Bed Position Chair  $Mobility charge 1 Mobility   Pt received in chair willing to participate in mobility. No complaints of pain. Requested to use restroom, Pt able to have BM. Left in chair with call bell in reach and all needs met.   [facility] Public librarian Phone [phone] Secondary Phone [phone]

## 2021-02-22 NOTE — Progress Notes (Signed)
ANTICOAGULATION CONSULT NOTE  Pharmacy Consult for Heparin Indication: atrial fibrillation  No Known Allergies  Patient Measurements: Height: 5\' 1"  (154.9 cm) Weight: 44.5 kg (98 lb 1.7 oz) IBW/kg (Calculated) : 47.8  Heparin Dosing Weight: 46 kg  Vital Signs: Temp: 97.3 F (36.3 C) (11/24 1138) Temp Source: Oral (11/24 1138) BP: 126/67 (11/24 1138) Pulse Rate: 117 (11/24 1138)  Labs: Recent Labs    02/20/21 0947 02/20/21 1021 02/20/21 1147 02/20/21 1229 02/20/21 1537 02/20/21 2224 02/21/21 0324 02/21/21 1141 02/22/21 0225  HGB 17.1* 18.0*  --   --   --   --  13.1  --  14.1  HCT 50.9* 53.0*  --   --   --   --  37.7  --  40.5  PLT 106*  --   --   --   --   --  89*  --  78*  APTT  --   --   --   --   --  62*  --   --   --   LABPROT  --   --  17.5* 17.4*  --   --   --   --   --   INR  --   --  1.4* 1.4*  --   --   --   --   --   HEPARINUNFRC  --   --   --   --   --  0.10* 0.29* 0.34  --   CREATININE 1.20* 0.90  --   --   --   --  1.12*  --  0.89  CKTOTAL 977*  --   --   --   --   --  481*  --   --   TROPONINIHS  --   --   --   --  33*  --   --   --   --      Estimated Creatinine Clearance: 27.2 mL/min (by C-G formula based on SCr of 0.89 mg/dL).   Medical History: Past Medical History:  Diagnosis Date   Atrioventricular block, complete ([place]) 2006   Hyperlipidemia    Hypertension    Paroxysmal atrial fibrillation ([place]) 2016   single episode detected on PPM interrogation    Medications:  Scheduled:   feeding supplement  237 mL Oral BID BM   levothyroxine  75 mcg Oral QAC breakfast   memantine  5 mg Oral QHS   multivitamin with minerals  1 tablet Oral Daily   nutrition supplement (JUVEN)  1 packet Oral BID BM   silver sulfADIAZINE   Topical Daily   sodium bicarbonate  1,300 mg Oral BID   Infusions:   sodium chloride Stopped (02/21/21 0947)   ceFEPime (MAXIPIME) IV 2 g (02/22/21 0810)   heparin 750 Units/hr (02/22/21 [id number])   vancomycin       Assessment: 87 YO F with history significant for atrial fibrillation on Eliquis PTA who presented 11/22 with a wound on L foot and concern for sepsis. LKD Eliquis 11/21 at 0900. Pharmacy consulted to start IV heparin.   Heparin level of 0.70 now on upper end of therapeutic range on 750 units/hr. CBC stable. No bleeding or infusion problems noted.  Goal of Therapy:  Heparin level 0.3-0.7 units/ml Monitor platelets by anticoagulation protocol: Yes   Plan:  Empirically reduce heparin rate to 700 units/hr Daily heparin level, CBC Monitor s/sx of bleeding Follow up plan to transition back to PO anticoagulant   Laurey Arrow, PharmD  PGY1 Pharmacy Resident 02/22/2021  1:07 PM  Please check [email or web address] for unit-specific pharmacy phone numbers.

## 2021-02-22 NOTE — Progress Notes (Signed)
PROGRESS NOTE  [name] [name] [id number] DOB: 1925-03-08 DOA: 02/20/2021 PCP: Janora Norlander, DO  HPI/Recap of past 24 hours: [name] [name] is a 87 y.o. female with medical history significant of left foot nonhealing wound x2 weeks, PAF on Eliquis, SSS status post PPM, HTN, HLD, dementia (lives alone), presented with fall at home.  Daughter reported that patient sustained an open wound of left dorsal foot about 2 weeks ago, for which patient completed 7 days of Augmentin and had outpatient debridement 5 days prior to presentation with Dr. Sharol Given.  Was taking doxycycline for last 4 days prior to presentation.  Found on the floor and complained about left hip pain and unable to get up herself.  In the ED found to be fluid overload, chest x-ray showed bilateral pleural effusion, 2+ pitting edema in lower extremity bilaterally, daughter reported the patient's legs "are always this big and puffy".  Patient was started on IV vancomycin and Zosyn empirically.  02/22/21: Patient was seen and examined at bedside.  Her son and daughter-in-law were present in the room.  He denies having any pain.  She had no new complaints.  Assessment/Plan: Principal Problem:   Sepsis ([place]) Active Problems:   Cold extremities   Recurrent falls   Ulcer of left foot with fat layer exposed ([place])  Sepsis secondary to left foot infection. -Evidenced by hypothermia, leukocytosis, elevated lactate, signs of endorgan damage of acute metabolic encephalopathy/confusion and AKI, source is left foot deep wound infection -Outpatient superficial culture shows Pseudomonas on 02/15/21 -Continue cefepime -She is also on IV vancomycin, MRSA screening pending. ID Dr. Drucilla Schmidt consulted, will see in consultation. Seen by orthopedic surgery, Dr. Sharol Given, provided recommendation for local wound care.  She will follow-up outpatient with Dr. Sharol Given for possible skin grafting in the office once the wound bed is healthy and viable. Continue  to follow blood cultures, negative to date.   Acute systolic CHF with decompensation -Daughter reported patient at baseline active, no complaint of chest pain or shortness of breath with moderate activity. -2D echo done on 02/21/2021 showed LVEF 25 to 30%.  The left ventricle has no regional wall motion abnormalities.  Small mobile echodensity adherent to pacemaker lead in right atrium could represent thrombus or vegetation. She is currently on heparin drip for history of paroxysmal A. fib.  Eliquis is on hold. Continue strict I's and O's and daily weight  Abnormal right atrium could represent thrombus or vegetation Closely follow blood cultures taken on 02/20/2021. Continue cefepime and IV vancomycin for now She is currently on heparin drip for history of paroxysmal A. fib.  Eliquis is on hold. Cardiology consulted to assist with the management.   Resolved AKI prerenal secondary to dehydration from poor oral intake. Continue to avoid nephrotoxic agents, dehydration and hypotension. Closely monitor urinary output with strict I's and O's   Improving transaminitis -No RUQ tenderness, likely secondary to decompensation of, and or mild rhabdomyolysis -Hydration with albumin, trend CK level.  LFTs are downtrending   Left hip pain, POA -Pelvic x-ray non-conclusive No fracture seen on CT scan. Supportive care   A. fib, paroxysmal Rate controlled. Home Eliquis on hold. Continue Heparin drip for now in [place] patient will go to the OR.   HTN BP is at goal Continue to hold off home HCTZ   Mild rhabdomyolysis Received hydration Currently off IV fluid. CPK downtrending.   Moderate protein calorie malnutrition -Consult nutrition Encourage oral protein calorie intake Oral supplement  Goals of care  Palliative care team consulted to assist with establishing goals of  care.    Critical care time: 65 minutes.       DVT prophylaxis: Heparin drip Code Status: Full code Family  Communication: Son and daughter-in-law at bedside. Disposition Plan: Expect more than 2 midnight hospital stay Consults called: Orthopedic surgery, cardiology, infectious disease. Admission status: Tele admit      Objective: Vitals:   02/21/21 1555 02/21/21 2002 02/22/21 0425 02/22/21 1138  BP: 100/65 115/64 98/78 126/67  Pulse: (!) 59 60 63 (!) 117  Resp: 18 16 16 18   Temp: 98.1 F (36.7 C) (!) 97.5 F (36.4 C) (!) 97.5 F (36.4 C) (!) 97.3 F (36.3 C)  TempSrc:  Oral Oral Oral  SpO2:  100% 100% 100%  Weight:   44.5 kg   Height:        Intake/Output Summary (Last 24 hours) at 02/22/2021 1606 Last data filed at 02/22/2021 1500 Gross per 24 hour  Intake 945.73 ml  Output 300 ml  Net 645.73 ml   Filed Weights   02/20/21 1012 02/21/21 0413 02/22/21 0425  Weight: 46 kg 43.6 kg 44.5 kg    Exam:  General: 87 y.o. year-old female frail-appearing in no acute distress.  She is alert and pleasantly confused.   Cardiovascular: Regular rate and rhythm no rubs or gallops.  No JVD or thyromegaly noted.   Respiratory: Clear to auscultation no wheezes or rales. Abdomen: Soft nontender normal bowel sounds present.   Musculoskeletal: 1+ pitting edema lower extremities bilaterally.   Skin:  Psychiatry: Mood is appropriate for condition 7. Neuro: She moves all 4 extremities equally.   Data Reviewed: CBC: Recent Labs  Lab 02/20/21 0947 02/20/21 1021 02/21/21 0324 02/22/21 0225  WBC 15.9*  --  8.2 8.8  NEUTROABS 14.3*  --   --   --   HGB 17.1* 18.0* 13.1 14.1  HCT 50.9* 53.0* 37.7 40.5  MCV 100.8*  --  96.7 96.9  PLT 106*  --  89* 78*   Basic Metabolic Panel: Recent Labs  Lab 02/20/21 0947 02/20/21 1021 02/21/21 0324 02/22/21 0225  NA 136 134* 137 137  K 4.5 4.4 3.7 3.7  CL 104 108 107 109  CO2 13*  --  18* 19*  GLUCOSE 96 96 66* 104*  BUN 35* 38* 33* 46*  CREATININE 1.20* 0.90 1.12* 0.89  CALCIUM 10.0  --  9.6 9.8   GFR: Estimated Creatinine Clearance:  27.2 mL/min (by C-G formula based on SCr of 0.89 mg/dL). Liver Function Tests: Recent Labs  Lab 02/20/21 0947 02/21/21 0324 02/22/21 0225  AST 88* 70* 55*  ALT 58* 39 36  ALKPHOS 101 72 73  BILITOT 1.7* 1.9* 1.2  PROT 6.3* 5.8* 5.3*  ALBUMIN 3.3* 3.9 3.4*   No results for input(s): LIPASE, AMYLASE in the last 168 hours. No results for input(s): AMMONIA in the last 168 hours. Coagulation Profile: Recent Labs  Lab 02/20/21 1147 02/20/21 1229  INR 1.4* 1.4*   Cardiac Enzymes: Recent Labs  Lab 02/20/21 0947 02/21/21 0324  CKTOTAL 977* 481*   BNP (last 3 results) No results for input(s): PROBNP in the last 8760 hours. HbA1C: No results for input(s): HGBA1C in the last 72 hours. CBG: No results for input(s): GLUCAP in the last 168 hours. Lipid Profile: No results for input(s): CHOL, HDL, LDLCALC, TRIG, CHOLHDL, LDLDIRECT in the last 72 hours. Thyroid Function Tests: Recent Labs    02/20/21 1537  TSH 6.505*   Anemia  Panel: No results for input(s): VITAMINB12, FOLATE, FERRITIN, TIBC, IRON, RETICCTPCT in the last 72 hours. Urine analysis: No results found for: COLORURINE, APPEARANCEUR, LABSPEC, PHURINE, GLUCOSEU, HGBUR, BILIRUBINUR, KETONESUR, PROTEINUR, UROBILINOGEN, NITRITE, LEUKOCYTESUR Sepsis Labs: @LABRCNTIP (procalcitonin:4,lacticidven:4)  ) Recent Results (from the past 240 hour(s))  Anaerobic and Aerobic Culture     Status: Abnormal   Collection Time: 02/14/21  4:46 PM   Specimen: Foot; Other   FO  Result Value Ref Range Status   Anaerobic Culture Final report  Final   Result 1 Comment  Final    Comment: No anaerobic growth in 72 hours.   Aerobic Culture Final report (A)  Final   Result 1 Pseudomonas putida (A)  Final    Comment: Heavy growth   Result 2 Mixed skin flora  Final    Comment: Light growth   Antimicrobial Susceptibility Comment  Final    Comment:       ** S = Susceptible; I = Intermediate; R = Resistant **                    P = Positive;  N = Negative             MICS are expressed in micrograms per mL    Antibiotic                 RSLT#1    RSLT#2    RSLT#3    RSLT#4 Amikacin                       S Aztreonam                      I Cefepime                       S Cefotaxime                     S Ceftazidime                    S Ceftriaxone                    S Ciprofloxacin                  S Gentamicin                     S Imipenem                       S Levofloxacin                   S Meropenem                      S Piperacillin/Tazobactam        S Ticarcillin/Clavulanate        I Tobramycin                     S Trimethoprim/Sulfa             R   Wound culture     Status: Abnormal   Collection Time: 02/15/21  3:45 PM   Specimen: Wound  Result Value Ref Range Status   MICRO NUMBER: 02/17/21  Final   SPECIMEN QUALITY: Adequate  Final   SOURCE: FOOT, LEFT  Final   STATUS: FINAL  Final   GRAM STAIN:   Final    Few White blood cells seen No epithelial cells seen Moderate Gram negative bacilli   ISOLATE 1: Pseudomonas putida (A)  Final    Comment: Heavy growth of Pseudomonas putida      Susceptibility   Pseudomonas putida - AEROBIC CULT, GRAM STAIN NEGATIVE 1    CEFTAZIDIME 2 Sensitive     CEFEPIME <=1 Sensitive     CIPROFLOXACIN <=0.25 Sensitive     LEVOFLOXACIN 1 Sensitive     GENTAMICIN <=1 Sensitive     PIP/TAZO 8 Sensitive     TOBRAMYCIN* <=1 Sensitive      * Legend: S = Susceptible  I = Intermediate R = Resistant  NS = Not susceptible * = Not tested  NR = Not reported **NN = See antimicrobic comments   Culture, blood (Routine x 2)     Status: None (Preliminary result)   Collection Time: 02/20/21 10:25 AM   Specimen: BLOOD  Result Value Ref Range Status   Specimen Description BLOOD LEFT ANTECUBITAL  Final   Special Requests   Final    BOTTLES DRAWN AEROBIC AND ANAEROBIC Blood Culture adequate volume   Culture   Final    NO GROWTH 2 DAYS Performed at [facility], [address] [place], [place] [address]    Report Status PENDING  Incomplete  Culture, blood (Routine x 2)     Status: None (Preliminary result)   Collection Time: 02/20/21 10:36 AM   Specimen: BLOOD  Result Value Ref Range Status   Specimen Description BLOOD RIGHT ANTECUBITAL  Final   Special Requests   Final    BOTTLES DRAWN AEROBIC AND ANAEROBIC Blood Culture results may not be optimal due to an inadequate volume of blood received in culture bottles   Culture   Final    NO GROWTH 2 DAYS Performed at [facility], [employer] [address], [place], [place] [address]    Report Status PENDING  Incomplete  Resp Panel by RT-PCR (Flu A&B, Covid) Nasopharyngeal Swab     Status: None   Collection Time: 02/20/21  3:34 PM   Specimen: Nasopharyngeal Swab; Nasopharyngeal(NP) swabs in vial transport medium  Result Value Ref Range Status   SARS Coronavirus 2 by RT PCR NEGATIVE NEGATIVE Final    Comment: (NOTE) SARS-CoV-2 target nucleic acids are NOT DETECTED.  The SARS-CoV-2 RNA is generally detectable in upper respiratory specimens during the acute phase of infection. The lowest concentration of SARS-CoV-2 viral copies this assay can detect is 138 copies/mL. A negative result does not preclude SARS-Cov-2 infection and should not be used as the sole basis for treatment or other patient management decisions. A negative result may occur with  improper specimen collection/handling, submission of specimen other than nasopharyngeal swab, presence of viral mutation(s) within the areas targeted by this assay, and inadequate number of viral copies(<138 copies/mL). A negative result must be combined with clinical observations, patient history, and epidemiological information. The expected result is Negative.  Fact Sheet for Patients:  EntrepreneurPulse.com.au  Fact Sheet for Healthcare Providers:  IncredibleEmployment.be  This test is no t yet approved or cleared by the [place] FDA and  has been authorized for detection and/or diagnosis of SARS-CoV-2 by FDA under an Emergency Use Authorization (EUA). This EUA will remain  in effect (meaning this test can be used) for the duration of the COVID-19 declaration under Section 564(b)(1) of the Act, 21 U.S.C.section 360bbb-3(b)(1), unless the authorization  is terminated  or revoked sooner.       Influenza A by PCR NEGATIVE NEGATIVE Final   Influenza B by PCR NEGATIVE NEGATIVE Final    Comment: (NOTE) The Xpert Xpress SARS-CoV-2/FLU/RSV plus assay is intended as an aid in the diagnosis of influenza from Nasopharyngeal swab specimens and should not be used as a sole basis for treatment. Nasal washings and aspirates are unacceptable for Xpert Xpress SARS-CoV-2/FLU/RSV testing.  Fact Sheet for Patients: EntrepreneurPulse.com.au  Fact Sheet for Healthcare Providers: IncredibleEmployment.be  This test is not yet approved or cleared by the [place] FDA and has been authorized for detection and/or diagnosis of SARS-CoV-2 by FDA under an Emergency Use Authorization (EUA). This EUA will remain in effect (meaning this test can be used) for the duration of the COVID-19 declaration under Section 564(b)(1) of the Act, 21 U.S.C. section 360bbb-3(b)(1), unless the authorization is terminated or revoked.  Performed at [facility], [place] [address], [place], [place] [address]       Studies: No results found.  Scheduled Meds:  feeding supplement  237 mL Oral BID BM   levothyroxine  75 mcg Oral QAC breakfast   memantine  5 mg Oral QHS   multivitamin with minerals  1 tablet Oral Daily   nutrition supplement (JUVEN)  1 packet Oral BID BM   potassium chloride  40 mEq Oral Once   silver sulfADIAZINE   Topical Daily   sodium bicarbonate  1,300 mg Oral BID    Continuous Infusions:  sodium chloride Stopped (02/21/21 0947)   ceFEPime (MAXIPIME) IV     heparin 700  Units/hr (02/22/21 1340)   vancomycin 750 mg (02/22/21 1340)     LOS: 2 days     Kayleen Memos, MD [facility] Pager [phone]  If 7PM-7AM, please contact night-coverage [email or web address] Password [facility] 02/22/2021, 4:06 PM

## 2021-02-22 NOTE — Consult Note (Signed)
ORTHOPAEDIC CONSULTATION  REQUESTING PHYSICIAN: Darlin Drop, DO  Chief Complaint: Ulceration dorsum left foot.  HPI: [name] [name] is a 87 y.o. female who presents with chronic ulcer dorsum of left foot.  Patient has been found down by the family several times.  On presentation to my office patient could not hold her balance sitting.  Patient had all 4 extremities that were extremely cold to touch.  Patient had massive venous insufficiency of both lower extremities with weeping edema from both legs.  Patient had worsening of the dorsal left foot ulcer.  Patient was sent to the emergency room for evaluation and treatment.  Past Medical History:  Diagnosis Date   Atrioventricular block, complete (HCC) 2006   Hyperlipidemia    Hypertension    Paroxysmal atrial fibrillation (HCC) 2016   single episode detected on PPM interrogation   Past Surgical History:  Procedure Laterality Date   ABDOMINAL HYSTERECTOMY     EYE SURGERY Bilateral    cataracts   GALLBLADDER SURGERY     Normal coronary angiography  1999   PPM GENERATOR CHANGEOUT N/A 01/15/2017   [employer] Accolade MRI EL DL pacemaker for CHB by Dr Johney Frame   PULSE GENERATOR IMPLANT     Guidant Ultra dual-mode, dual-pacing, dual-sensing generator for complete atrioventricular block 2006 with some noise on atrial lead and generator recall for very low incidence of loss of output   Social History   Socioeconomic History   Marital status: Widowed    Spouse name: Not on file   Number of children: 2   Years of education: Not on file   Highest education level: Not on file  Occupational History   Not on file  Tobacco Use   Smoking status: Never   Smokeless tobacco: Never  Substance and Sexual Activity   Alcohol use: No    Alcohol/week: 0.0 standard drinks   Drug use: No   Sexual activity: Not on file  Other Topics Concern   Not on file  Social History Narrative   Lives alone   Son lives next door, daughter lives  in [place]   Social Determinants of Health   Financial Resource Strain: Low Risk    Difficulty of Paying Living Expenses: Not hard at all  Food Insecurity: No Food Insecurity   Worried About Programme researcher, broadcasting/film/video in the Last Year: Never true   Barista in the Last Year: Never true  Transportation Needs: No Transportation Needs   Lack of Transportation (Medical): No   Lack of Transportation (Non-Medical): No  Physical Activity: Insufficiently Active   Days of Exercise per Week: 7 days   Minutes of Exercise per Session: 20 min  Stress: No Stress Concern Present   Feeling of Stress : Only a little  Social Connections: Moderately Integrated   Frequency of Communication with Friends and Family: More than three times a week   Frequency of Social Gatherings with Friends and Family: More than three times a week   Attends Religious Services: More than 4 times per year   Active Member of [employer] or Organizations: Yes   Attends Banker Meetings: More than 4 times per year   Marital Status: Widowed   Family History  Problem Relation Age of Onset   Cancer Sister        breast   - negative except otherwise stated in the family history section No Known Allergies Prior to Admission medications   Medication Sig Start Date  End Date Taking? Authorizing Provider  apixaban (ELIQUIS) 5 MG TABS tablet Take 1 tablet (5 mg total) by mouth 2 (two) times daily. (Needs to be seen before next refill) 09/29/20  Yes Ronnie Doss M, DO  atorvastatin (LIPITOR) 10 MG tablet Take 1 tablet (10 mg total) by mouth every evening. 10/30/20  Yes Ronnie Doss M, DO  Cholecalciferol (VITAMIN D3) 1.25 MG (50000 UT) CAPS TAKE 1 CAPSULE (50,000 UNITS TOTAL) BY MOUTH EVERY 7 (SEVEN) DAYS. X12 WEEKS Patient taking differently: Take 50,000 Units by mouth every Friday. 08/02/19  Yes Gottschalk, Ashly M, DO  diltiazem (CARDIZEM CD) 120 MG 24 hr capsule TAKE 1 CAPSULE BY MOUTH EVERY DAY 09/14/20  Yes  Gottschalk, Ashly M, DO  dorzolamide-timolol (COSOPT) 22.3-6.8 MG/ML ophthalmic solution Place 1 drop into both eyes 2 (two) times daily. 04/29/19  Yes [provider]  hydrochlorothiazide (HYDRODIURIL) 12.5 MG tablet TAKE 1 TABLET BY MOUTH EVERY DAY 09/14/20  Yes Ronnie Doss M, DO  levothyroxine (SYNTHROID) 75 MCG tablet Take 1 tablet (75 mcg total) by mouth daily before breakfast. 11/29/20  Yes Gottschalk, Ashly M, DO  memantine (NAMENDA) 5 MG tablet Take 1 tablet (5 mg total) by mouth daily for 30 days, THEN 1 tablet (5 mg total) 2 (two) times daily. 01/10/21 03/11/21 Yes Gottschalk, Leatrice Jewels M, DO  silver sulfADIAZINE (SILVADENE) 1 % cream Apply 1 application topically daily. Apply to affected area daily plus dry dressing Patient taking differently: Apply 1 application topically daily. Apply to left foot daily plus dry dressing 02/15/21  Yes Newt Minion, MD  ciprofloxacin (CIPRO) 500 MG tablet Take 1 tablet (500 mg total) by mouth 2 (two) times daily. Twice a day for 2 weeks. Patient not taking: Reported on 02/20/2021 02/19/21   Newt Minion, MD  doxycycline (VIBRA-TABS) 100 MG tablet Take 1 tablet (100 mg total) by mouth 2 (two) times daily for 10 days. 1 po bid 02/14/21 02/24/21  Baruch Gouty, FNP   CT HEAD WO CONTRAST  Result Date: 02/20/2021 CLINICAL DATA:  Head trauma EXAM: CT HEAD WITHOUT CONTRAST TECHNIQUE: Contiguous axial images were obtained from the base of the skull through the vertex without intravenous contrast. COMPARISON:  None. FINDINGS: Brain: No acute intracranial hemorrhage, mass effect, or herniation. No extra-axial fluid collections. No evidence of acute territorial infarct. No hydrocephalus. Mild cortical volume loss. Patchy hypodensities in the periventricular and subcortical white matter, likely secondary to chronic microvascular ischemic changes. Vascular: No hyperdense vessel or unexpected calcification. Skull: Normal. Negative for fracture or focal  lesion. Sinuses/Orbits: No acute process identified. Surgical changes in the globes. Other: None. IMPRESSION: Chronic changes with no acute intracranial process identified. Electronically Signed   By: Ofilia Neas M.D.   On: 02/20/2021 10:59   DG Pelvis Portable  Result Date: 02/20/2021 CLINICAL DATA:  Trauma, fall EXAM: PORTABLE PELVIS 1-2 VIEWS COMPARISON:  None. FINDINGS: No displaced fracture or dislocation is seen. There is a faint linear lucency in the lateral aspect of greater trochanter in the left femur. Degenerative changes are noted in the lower lumbar spine. IMPRESSION: No displaced fracture or dislocation is seen. There is linear lucency seen in the greater trochanter of the proximal left femur which may be an artifact due to confluence of soft tissues over the bony margin or suggest undisplaced fracture. Please correlate with clinical symptoms and physical examination findings. If there are focal symptoms in the area of greater trochanter of proximal left femur, repeat radiographic examination and CT if warranted  should be considered. Electronically Signed   By: Elmer Picker M.D.   On: 02/20/2021 10:46   CT HIP LEFT WO CONTRAST  Result Date: 02/20/2021 CLINICAL DATA:  [place]. EXAM: CT OF THE LEFT HIP WITHOUT CONTRAST, CT OF THE RIGHT HIP WITHOUT CONTRAST TECHNIQUE: Multidetector CT imaging of BOTH HIPS was performed according to the standard protocol. Multiplanar CT image reconstructions were also generated. COMPARISON:  Radiographs 02/20/2021 FINDINGS: Both hips are normally located. Mild bilateral hip joint degenerative changes given the patient's age. No acute hip fracture or evidence of AVN. The bony pelvis is intact. No pelvic fractures are identified. Moderate degenerative changes at the pubic symphysis. The SI joints are intact. The hip and pelvic musculature are grossly normal by CT. No obvious muscle tear or intramuscular hematoma. Mom no inguinal mass or hernia. No  significant intrapelvic abnormalities. IMPRESSION: 1. Mild bilateral hip joint degenerative changes. 2. No acute hip fracture or evidence of AVN. 3. No pelvic fractures are identified. Electronically Signed   By: Marijo Sanes M.D.   On: 02/20/2021 13:57   CT HIP RIGHT WO CONTRAST  Result Date: 02/20/2021 CLINICAL DATA:  [place]. EXAM: CT OF THE LEFT HIP WITHOUT CONTRAST, CT OF THE RIGHT HIP WITHOUT CONTRAST TECHNIQUE: Multidetector CT imaging of BOTH HIPS was performed according to the standard protocol. Multiplanar CT image reconstructions were also generated. COMPARISON:  Radiographs 02/20/2021 FINDINGS: Both hips are normally located. Mild bilateral hip joint degenerative changes given the patient's age. No acute hip fracture or evidence of AVN. The bony pelvis is intact. No pelvic fractures are identified. Moderate degenerative changes at the pubic symphysis. The SI joints are intact. The hip and pelvic musculature are grossly normal by CT. No obvious muscle tear or intramuscular hematoma. Mom no inguinal mass or hernia. No significant intrapelvic abnormalities. IMPRESSION: 1. Mild bilateral hip joint degenerative changes. 2. No acute hip fracture or evidence of AVN. 3. No pelvic fractures are identified. Electronically Signed   By: Marijo Sanes M.D.   On: 02/20/2021 13:57   DG Chest Port 1 View  Result Date: 02/20/2021 CLINICAL DATA:  Trauma, sepsis EXAM: PORTABLE CHEST 1 VIEW COMPARISON:  None FINDINGS: Transverse diameter of heart is increased. Central pulmonary vessels are prominent. There are no signs of alveolar pulmonary edema. Small bilateral pleural effusions are seen, more so on the left side. Evaluation of lower lung fields for infiltrates is limited by pleural effusions. There is no pneumothorax. Pacemaker battery is seen in the left infraclavicular region with tips of leads in the right atrium and right ventricle. IMPRESSION: Cardiomegaly. Small bilateral pleural effusions, more so on the  left side. There are no signs of alveolar pulmonary edema. Evaluation of lower lung fields for infiltrates is limited by the effusions. Electronically Signed   By: Elmer Picker M.D.   On: 02/20/2021 10:42   VAS [place] ABI WITH/WO TBI  Result Date: 02/22/2021  LOWER EXTREMITY DOPPLER STUDY Patient Name:  [name] [name]  Date of Exam:   02/21/2021 Medical Rec #: [id number]       Accession #:    [id number] Date of Birth: 04/05/1924       Patient Gender: F Patient Age:   48 years Exam Location:  [facility] Procedure:      VAS [place] ABI WITH/WO TBI Referring Phys: Wynetta Fines --------------------------------------------------------------------------------  Indications: Non-healing wound on dorsum of LT foot. High Risk Factors: Hypertension, hyperlipidemia.  Limitations: Today's exam was limited due to an open wound and bandages.  Comparison Study: No prior studies. Performing Technologist: Darlin Coco RDMS RVT  Examination Guidelines: A complete evaluation includes at minimum, Doppler waveform signals and systolic blood pressure reading at the level of bilateral brachial, anterior tibial, and posterior tibial arteries, when vessel segments are accessible. Bilateral testing is considered an integral part of a complete examination. Photoelectric Plethysmograph (PPG) waveforms and toe systolic pressure readings are included as required and additional duplex testing as needed. Limited examinations for reoccurring indications may be performed as noted.  ABI Findings: +---------+------------------+-----+----------+--------+ Right    Rt Pressure (mmHg)IndexWaveform  Comment  +---------+------------------+-----+----------+--------+ Brachial 105                                       +---------+------------------+-----+----------+--------+ PTA      123               1.16 monophasic         +---------+------------------+-----+----------+--------+ DP       121               1.14 biphasic            +---------+------------------+-----+----------+--------+ Great Toe66                0.62 Abnormal           +---------+------------------+-----+----------+--------+ +---------+------------------+-----+--------+-------+ Left     Lt Pressure (mmHg)IndexWaveformComment +---------+------------------+-----+--------+-------+ Brachial 106                                    +---------+------------------+-----+--------+-------+ PTA      154               1.45 biphasic        +---------+------------------+-----+--------+-------+ DP       106               1.00 biphasic        +---------+------------------+-----+--------+-------+ Great Toe78                0.74 Normal          +---------+------------------+-----+--------+-------+ +-------+-----------+-----------+------------+------------+ ABI/TBIToday's ABIToday's TBIPrevious ABIPrevious TBI +-------+-----------+-----------+------------+------------+ Right  1.16       0.64                                +-------+-----------+-----------+------------+------------+ Left   1.45       0.74                                +-------+-----------+-----------+------------+------------+ Arterial wall calcification precludes accurate ankle pressures and ABIs.  Summary: Right: Resting right ankle-brachial index is within normal range. No evidence of significant right lower extremity arterial disease. The right toe-brachial index is abnormal. Although ankle brachial indices are within normal limits (0.95-1.29), arterial Doppler waveforms at the ankle suggest some component of arterial occlusive disease.  Left: Resting left ankle-brachial index indicates noncompressible left lower extremity arteries. The left toe-brachial index is normal.  *See table(s) above for measurements and observations.  Electronically signed by Harold Barban MD on 02/22/2021 at 12:51:16 AM.    Final    ECHOCARDIOGRAM COMPLETE  Result Date: 02/21/2021     ECHOCARDIOGRAM REPORT   Patient Name:   [name] [name] Date of Exam: 02/21/2021 Medical Rec #:  [id number]      Height:       61.0 in Accession #:    [id number]     Weight:       96.1 lb Date of Birth:  01/02/1925      BSA:          1.383 m Patient Age:    89 years       BP:           95/61 mmHg Patient Gender: F              HR:           61 bpm. Exam Location:  Inpatient Procedure: 2D Echo, 3D Echo, Cardiac Doppler, Color Doppler and Intracardiac            Opacification Agent  Results communicated to Dr Margo Aye at 13:13 on 02/21/21. Indications:    I50.40* Unspecified combined systolic (congestive) and diastolic                 (congestive) heart failure  History:        Patient has no prior history of Echocardiogram examinations.                 Pacemaker and Abnormal ECG, Arrythmias:Atrial Fibrillation,                 Signs/Symptoms:Altered Mental Status and Alzheimer's; Risk                 Factors:Hypertension and Dyslipidemia.  Sonographer:    Sheralyn Boatman RDCS Referring Phys: [id number] Emeline General IMPRESSIONS  1. Left ventricular ejection fraction, by estimation, is 25 to 30%. The left ventricle has severely decreased function. The left ventricle has no regional wall motion abnormalities. The left ventricular internal cavity size was mildly dilated. There is mild left ventricular hypertrophy. Left ventricular diastolic parameters are indeterminate.  2. Right ventricular systolic function is mildly reduced. The right ventricular size is moderately enlarged. There is mildly elevated pulmonary artery systolic pressure. The estimated right ventricular systolic pressure is 40.0 mmHg.  3. Small mobil echodensity adherent to pacemaker lead in right atrium (image 30), could represent thrombus or vegetation, would recommend checking blood cultures  4. Left atrial size was severely dilated.  5. Right atrial size was severely dilated.  6. The mitral valve is abnormal. Severe mitral valve regurgitation. Appears functional. No  evidence of mitral stenosis.  7. Tricuspid valve regurgitation is severe. Hepatic vein systolic flow reversal.  8. The aortic valve is tricuspid. Aortic valve regurgitation is not visualized. Aortic valve sclerosis is present, with no evidence of aortic valve stenosis.  9. The inferior vena cava is dilated in size with <50% respiratory variability, suggesting right atrial pressure of 15 mmHg. FINDINGS  Left Ventricle: Left ventricular ejection fraction, by estimation, is 25 to 30%. The left ventricle has severely decreased function. The left ventricle has no regional wall motion abnormalities. The left ventricular internal cavity size was mildly dilated. There is mild left ventricular hypertrophy. Left ventricular diastolic parameters are indeterminate. Right Ventricle: The right ventricular size is moderately enlarged. Right vetricular wall thickness was not well visualized. Right ventricular systolic function is mildly reduced. There is mildly elevated pulmonary artery systolic pressure. The tricuspid  regurgitant velocity is 2.50 m/s, and with an assumed right atrial pressure of 15 mmHg, the estimated right ventricular systolic pressure is 40.0 mmHg. Left Atrium: Left atrial size was severely dilated. Right Atrium: Right atrial size was severely dilated. Pericardium:  There is no evidence of pericardial effusion. Mitral Valve: The mitral valve is abnormal. Severe mitral valve regurgitation. No evidence of mitral valve stenosis. Tricuspid Valve: The tricuspid valve is normal in structure. Tricuspid valve regurgitation is severe. Aortic Valve: The aortic valve is tricuspid. Aortic valve regurgitation is not visualized. Aortic valve sclerosis is present, with no evidence of aortic valve stenosis. Pulmonic Valve: The pulmonic valve was not well visualized. Pulmonic valve regurgitation is mild. Aorta: The aortic root and ascending aorta are structurally normal, with no evidence of dilitation. Venous: The inferior vena  cava is dilated in size with less than 50% respiratory variability, suggesting right atrial pressure of 15 mmHg. IAS/Shunts: The interatrial septum was not well visualized. Additional Comments: There is a small pleural effusion in the left lateral region.  LEFT VENTRICLE PLAX 2D LVIDd:         5.10 cm      Diastology LVIDs:         4.60 cm      LV e' medial:    3.75 cm/s LV PW:         0.90 cm      LV E/e' medial:  29.9 LV IVS:        1.00 cm      LV e' lateral:   4.12 cm/s LVOT diam:     1.80 cm      LV E/e' lateral: 27.2 LV SV:         37 LV SV Index:   27 LVOT Area:     2.54 cm                              3D Volume EF: LV Volumes (MOD)            3D EF:        28 % LV vol d, MOD A2C: 134.0 ml LV EDV:       143 ml LV vol d, MOD A4C: 110.0 ml LV ESV:       103 ml LV vol s, MOD A2C: 100.0 ml LV SV:        40 ml LV vol s, MOD A4C: 90.0 ml LV SV MOD A2C:     34.0 ml LV SV MOD A4C:     110.0 ml LV SV MOD BP:      28.0 ml RIGHT VENTRICLE            IVC RV S prime:     8.74 cm/s  IVC diam: 2.30 cm TAPSE (M-mode): 1.6 cm LEFT ATRIUM             Index        RIGHT ATRIUM           Index LA diam:        4.40 cm 3.18 cm/m   RA Area:     26.80 cm LA Vol (A2C):   99.1 ml 71.64 ml/m  RA Volume:   83.90 ml  60.65 ml/m LA Vol (A4C):   77.7 ml 56.17 ml/m LA Biplane Vol: 87.4 ml 63.18 ml/m  AORTIC VALVE             PULMONIC VALVE LVOT Vmax:   87.80 cm/s  PR End Diast Vel: 2.55 msec LVOT Vmean:  51.100 cm/s LVOT VTI:    0.145 m  AORTA Ao Root diam: 3.00 cm Ao Asc diam:  3.10 cm MITRAL VALVE  TRICUSPID VALVE MV Area (PHT): 4.04 cm       TR Peak grad:   25.0 mmHg MV Decel Time: 188 msec       TR Vmax:        250.00 cm/s MR Peak grad:    63.0 mmHg MR Mean grad:    38.0 mmHg    SHUNTS MR Vmax:         397.00 cm/s  Systemic VTI:  0.14 m MR Vmean:        283.0 cm/s   Systemic Diam: 1.80 cm MR PISA:         1.90 cm MR PISA Eff ROA: 15 mm MR PISA Radius:  0.55 cm MV E velocity: 112.00 cm/s MV A velocity: 36.20  cm/s MV E/A ratio:  3.09 Oswaldo Milian MD Electronically signed by Oswaldo Milian MD Signature Date/Time: 02/21/2021/1:14:28 PM    Final    - pertinent xrays, CT, MRI studies were reviewed and independently interpreted  Positive ROS: All other systems have been reviewed and were otherwise negative with the exception of those mentioned in the HPI and as above.  Physical Exam: General: Alert, no acute distress Psychiatric: Patient is competent for consent with normal mood and affect Lymphatic: No axillary or cervical lymphadenopathy Cardiovascular: No pedal edema Respiratory: No cyanosis, no use of accessory musculature GI: No organomegaly, abdomen is soft and non-tender    Images:  @ENCIMAGES @  Labs:  Lab Results  Component Value Date   HGBA1C 5.6 11/28/2020   REPTSTATUS PENDING 02/20/2021   GRAMSTAIN  02/06/2021    ABUNDANT WBC PRESENT, PREDOMINANTLY PMN NO ORGANISMS SEEN Performed at [facility] [address] [place], [place] [address]    GRAMSTAIN  02/06/2021    WBC PRESENT,BOTH PMN AND MONONUCLEAR NO ORGANISMS SEEN CYTOSPIN SMEAR Performed at [facility], [address] [place], [place] [address]    CULT  02/20/2021    NO GROWTH < 24 HOURS Performed at [facility] [address], [place], [place] [address]    LABORGA LECLERCIA ADECARBOXYLATA 02/06/2021   LABORGA PSEUDOMONAS PUTIDA 02/06/2021    Lab Results  Component Value Date   ALBUMIN 3.4 (L) 02/22/2021   ALBUMIN 3.9 02/21/2021   ALBUMIN 3.3 (L) 02/20/2021     CBC EXTENDED Latest Ref Rng & Units 02/22/2021 02/21/2021 02/20/2021  WBC 4.0 - 10.5 K/uL 8.8 8.2 -  RBC 3.87 - 5.11 MIL/uL 4.18 3.90 -  HGB 12.0 - 15.0 g/dL 14.1 13.1 18.0(H)  HCT 36.0 - 46.0 % 40.5 37.7 53.0(H)  PLT 150 - 400 K/uL 78(L) 89(L) -  NEUTROABS 1.7 - 7.7 K/uL - - -  LYMPHSABS 0.7 - 4.0 K/uL - - -    Neurologic: Patient does not have protective sensation bilateral lower  extremities.   MUSCULOSKELETAL:   Skin: Examination the swelling in both legs has improved significantly her legs are not cold as they were previously there is no weeping edema.  There are ischemic changes to the ulcer on the dorsum of the left foot which encompasses almost the entire dorsum of the foot.  There is 1 visible extensor tendon but no exposed bone no abscess no cellulitis.  In my office patient had triphasic flow by Doppler.  Ankle-brachial indices in the hospital shows adequate toe pressures.  Cardiac study does show cardiac insufficiency.  Patient's family states that she has not been eating or drinking.  Her hemoglobin is 14 albumin 3.4 and a white cell count of 8.8.  Assessment: Assessment: Failure to thrive with cardiac insufficiency and chronic ulcer over the dorsum of the left foot.  Plan: I will place orders for Silvadene dressing changes daily for the left foot.  I will follow-up in the office and could proceed with skin grafting in the office once the wound bed is healthy and viable.  Discussed the importance of family with nutritional intake.  They state that the patient will not eat or drink.  I do not think that surgical debridement would provide any significant benefit to her care.  Thank you for the consult and the opportunity to see Ms. Engel  Meridee Score, MD [place] [phone] 9:02 AM

## 2021-02-23 DIAGNOSIS — R931 Abnormal findings on diagnostic imaging of heart and coronary circulation: Secondary | ICD-10-CM

## 2021-02-23 DIAGNOSIS — A498 Other bacterial infections of unspecified site: Secondary | ICD-10-CM

## 2021-02-23 DIAGNOSIS — E43 Unspecified severe protein-calorie malnutrition: Secondary | ICD-10-CM | POA: Insufficient documentation

## 2021-02-23 DIAGNOSIS — R296 Repeated falls: Secondary | ICD-10-CM | POA: Diagnosis not present

## 2021-02-23 DIAGNOSIS — I339 Acute and subacute endocarditis, unspecified: Secondary | ICD-10-CM

## 2021-02-23 DIAGNOSIS — I5021 Acute systolic (congestive) heart failure: Secondary | ICD-10-CM | POA: Diagnosis not present

## 2021-02-23 DIAGNOSIS — T827XXA Infection and inflammatory reaction due to other cardiac and vascular devices, implants and grafts, initial encounter: Secondary | ICD-10-CM | POA: Diagnosis not present

## 2021-02-23 DIAGNOSIS — T827XXD Infection and inflammatory reaction due to other cardiac and vascular devices, implants and grafts, subsequent encounter: Secondary | ICD-10-CM | POA: Diagnosis not present

## 2021-02-23 DIAGNOSIS — R600 Localized edema: Secondary | ICD-10-CM

## 2021-02-23 DIAGNOSIS — L97522 Non-pressure chronic ulcer of other part of left foot with fat layer exposed: Secondary | ICD-10-CM | POA: Diagnosis not present

## 2021-02-23 DIAGNOSIS — A419 Sepsis, unspecified organism: Secondary | ICD-10-CM | POA: Diagnosis not present

## 2021-02-23 DIAGNOSIS — R7989 Other specified abnormal findings of blood chemistry: Secondary | ICD-10-CM

## 2021-02-23 DIAGNOSIS — I38 Endocarditis, valve unspecified: Secondary | ICD-10-CM

## 2021-02-23 DIAGNOSIS — Z7189 Other specified counseling: Secondary | ICD-10-CM | POA: Diagnosis not present

## 2021-02-23 LAB — HEPATIC FUNCTION PANEL
ALT: 33 U/L (ref 0–44)
AST: 40 U/L (ref 15–41)
Albumin: 2.9 g/dL — ABNORMAL LOW (ref 3.5–5.0)
Alkaline Phosphatase: 63 U/L (ref 38–126)
Bilirubin, Direct: 0.3 mg/dL — ABNORMAL HIGH (ref 0.0–0.2)
Indirect Bilirubin: 1 mg/dL — ABNORMAL HIGH (ref 0.3–0.9)
Total Bilirubin: 1.3 mg/dL — ABNORMAL HIGH (ref 0.3–1.2)
Total Protein: 4.8 g/dL — ABNORMAL LOW (ref 6.5–8.1)

## 2021-02-23 LAB — CBC
HCT: 35.1 % — ABNORMAL LOW (ref 36.0–46.0)
HCT: 37.7 % (ref 36.0–46.0)
Hemoglobin: 11.9 g/dL — ABNORMAL LOW (ref 12.0–15.0)
Hemoglobin: 12.7 g/dL (ref 12.0–15.0)
MCH: 33.4 pg (ref 26.0–34.0)
MCH: 34 pg (ref 26.0–34.0)
MCHC: 33.9 g/dL (ref 30.0–36.0)
MCHC: 33.7 g/dL (ref 30.0–36.0)
MCV: 98.6 fL (ref 80.0–100.0)
MCV: 101.1 fL — ABNORMAL HIGH (ref 80.0–100.0)
Platelets: 71 10*3/uL — ABNORMAL LOW (ref 150–400)
Platelets: 80 10*3/uL — ABNORMAL LOW (ref 150–400)
RBC: 3.56 MIL/uL — ABNORMAL LOW (ref 3.87–5.11)
RBC: 3.73 MIL/uL — ABNORMAL LOW (ref 3.87–5.11)
RDW: 17.7 % — ABNORMAL HIGH (ref 11.5–15.5)
RDW: 18.3 % — ABNORMAL HIGH (ref 11.5–15.5)
WBC: 7.1 10*3/uL (ref 4.0–10.5)
WBC: 10.1 10*3/uL (ref 4.0–10.5)
nRBC: 0 % (ref 0.0–0.2)
nRBC: 0 % (ref 0.0–0.2)

## 2021-02-23 LAB — BASIC METABOLIC PANEL
Anion gap: 6 (ref 5–15)
BUN: 45 mg/dL — ABNORMAL HIGH (ref 8–23)
CO2: 22 mmol/L (ref 22–32)
Calcium: 9.3 mg/dL (ref 8.9–10.3)
Chloride: 110 mmol/L (ref 98–111)
Creatinine, Ser: 0.68 mg/dL (ref 0.44–1.00)
GFR, Estimated: >60 mL/min (ref >60)
Glucose, Bld: 92 mg/dL (ref 70–99)
Potassium: 4 mmol/L (ref 3.5–5.1)
Sodium: 138 mmol/L (ref 135–145)

## 2021-02-23 LAB — OCCULT BLOOD X 1 CARD TO LAB, STOOL: Fecal Occult Bld: POSITIVE — AB

## 2021-02-23 LAB — CK: Total CK: 65 U/L (ref 38–234)

## 2021-02-23 LAB — HEPARIN LEVEL (UNFRACTIONATED): Heparin Unfractionated: 0.41 IU/mL (ref 0.30–0.70)

## 2021-02-23 MED ORDER — SODIUM CHLORIDE 0.9 % IV SOLN
2.0000 g | Freq: Two times a day (BID) | INTRAVENOUS | Status: DC
  Administered 2021-02-23 – 2021-02-28 (×11): 2 g via INTRAVENOUS
  Filled 2021-02-23 (×12): qty 2

## 2021-02-23 MED ORDER — EMPAGLIFLOZIN 10 MG PO TABS
10.0000 mg | ORAL_TABLET | Freq: Every day | ORAL | Status: DC
  Administered 2021-02-23 – 2021-02-28 (×6): 10 mg via ORAL
  Filled 2021-02-23 (×6): qty 1

## 2021-02-23 MED ORDER — APIXABAN 2.5 MG PO TABS
2.5000 mg | ORAL_TABLET | Freq: Two times a day (BID) | ORAL | Status: DC
Start: 2021-02-23 — End: 2021-02-23

## 2021-02-23 MED ORDER — FUROSEMIDE 10 MG/ML IJ SOLN
20.0000 mg | Freq: Once | INTRAMUSCULAR | Status: AC
  Administered 2021-02-23: 20 mg via INTRAVENOUS
  Filled 2021-02-23: qty 2

## 2021-02-23 MED ORDER — APIXABAN 2.5 MG PO TABS
2.5000 mg | ORAL_TABLET | Freq: Two times a day (BID) | ORAL | Status: DC
  Administered 2021-02-23: 2.5 mg via ORAL
  Filled 2021-02-23: qty 1

## 2021-02-23 NOTE — Progress Notes (Signed)
Nutrition Follow-up  DOCUMENTATION CODES:   Severe malnutrition in context of chronic illness  INTERVENTION:  Continue Ensure Enlive po BID, each supplement provides 350 kcal and 20 grams of protein  Continue Juven BID, each packet provides 95 calories, 2.5 grams of protein.  Encourage adequate PO intake.   NUTRITION DIAGNOSIS:   Severe Malnutrition related to chronic illness (CHF) as evidenced by severe fat depletion, severe muscle depletion.  GOAL:   Patient will meet greater than or equal to 90% of their needs; progressing  MONITOR:   PO intake, Supplement acceptance, Labs, Weight trends, Skin, I & O's  REASON FOR ASSESSMENT:   Consult Assessment of nutrition requirement/status  ASSESSMENT:   87 yo female with a PMH of non healing wound on dorsum of L foot sustained when walking in her yard barefoot and encountering yellow jackets, has been being treated by PCP and orthopedist, HLD, PAF, hypothyroid, SSS with pacemaker, and mild dementia who presents after a fall. Admitted with sepsis.  Meal completion has been 10-50%. Pt reports having a good appetite currently and PTA however endorses she does not eat a lot of meals. Pt currently has ensure and juven ordered and has been consuming them. RD to continue with current orders to aid in caloric and protein needs as well as in wound healing. Pt encouraged to eat her food at meals and to drink her supplements.   NUTRITION - FOCUSED PHYSICAL EXAM:  Flowsheet Row Most Recent Value  Orbital Region Severe depletion  Upper Arm Region Severe depletion  Thoracic and Lumbar Region Severe depletion  Buccal Region Severe depletion  Temple Region Severe depletion  Clavicle Bone Region Severe depletion  Clavicle and Acromion Bone Region Severe depletion  Scapular Bone Region Unable to assess  Dorsal Hand Unable to assess  Patellar Region Moderate depletion  Anterior Thigh Region Moderate depletion  Posterior Calf Region Moderate  depletion  Edema (RD Assessment) None  Hair Reviewed  Eyes Reviewed  Mouth Reviewed  Skin Reviewed  Nails Reviewed      Labs and medications reviewed.   Diet Order:   Diet Order             Diet Heart Room service appropriate? Yes; Fluid consistency: Thin  Diet effective now                   EDUCATION NEEDS:   Education needs have been addressed  Skin:  Skin Assessment: Reviewed RN Assessment Skin Integrity Issues:: Other (Comment) Other: Venous stasis ulcer on L buttocks and chronic wound on L foot  Last BM:  11/24  Height:   Ht Readings from Last 1 Encounters:  02/20/21 5\' 1"  (1.549 m)    Weight:   Wt Readings from Last 1 Encounters:  02/23/21 46.3 kg   BMI:  Body mass index is 19.29 kg/m.  Estimated Nutritional Needs:   Kcal:  1400-1600  Protein:  60-75 grams  Fluid:  >1.4 L  02/25/21, MS, RD, LDN RD pager number/after hours weekend pager number on Am[employer].

## 2021-02-23 NOTE — Progress Notes (Signed)
Occupational Therapy Treatment Patient Details Name: Allessandra Sweat Langsam MRN: [id number] DOB: 1924-07-29 Today's Date: 02/23/2021   History of present illness Pt is a 87 year old woman admitted after falling on 02/20/21.  Pt + sepsis PMH: non healing wound on dorsum of L foot sustained when walking in her yard barefoot and encountering yellow jackets, has been being treated by PCP and orthopedist wth outpt debridement, HLD, PAF, hypothyroid, SSS with pacemaker, mild dementia.   OT comments  Patient received in supine and agreeable to OT treatment. Patient was min assist to get to EOB and once she stood began having BM.  Patient performed 2 stands from EOB to assist with cleaning. Patient ambulated to sink and performed most tasks seated due to fatigue from standing earlier. Patient ambulated to recliner with min assist to stand and min guard for mobility.  Patient making good progress with OT treatment. Acute OT to continue to follow.    Recommendations for follow up therapy are one component of a multi-disciplinary discharge planning process, led by the attending physician.  Recommendations may be updated based on patient status, additional functional criteria and insurance authorization.    Follow Up Recommendations  Home health OT    Assistance Recommended at Discharge Intermittent Supervision/Assistance  Equipment Recommendations  None recommended by OT    Recommendations for Other Services      Precautions / Restrictions Precautions Precautions: Fall Restrictions Weight Bearing Restrictions: No       Mobility Bed Mobility Overal bed mobility: Needs Assistance Bed Mobility: Supine to Sit     Supine to sit: Min assist     General bed mobility comments: verbal cues for rail use and min assist for trunk    Transfers Overall transfer level: Needs assistance Equipment used: Rolling walker (2 wheels) Transfers: Sit to/from Stand Sit to Stand: Min assist;Min guard            General transfer comment: min assist to power up from chair     Balance Overall balance assessment: Needs assistance   Sitting balance-Leahy Scale: Good Sitting balance - Comments: able to sit on EOB unassisted   Standing balance support: During functional activity;Reliant on assistive device for balance;No upper extremity supported Standing balance-Leahy Scale: Fair Standing balance comment: reliant on UE support when standing                           ADL either performed or assessed with clinical judgement   ADL Overall ADL's : Needs assistance/impaired     Grooming: Wash/dry hands;Wash/dry face;Oral care;Supervision/safety;Sitting Grooming Details (indicate cue type and reason): patient performed grooming seated at sink Upper Body Bathing: Set up;Sitting Upper Body Bathing Details (indicate cue type and reason): performed seated at sink Lower Body Bathing: Moderate assistance;Maximal assistance Lower Body Bathing Details (indicate cue type and reason): patient bathed peri area and top of legs while seated in chair and max assist to bathe bottom and lower legs Upper Body Dressing : Minimal assistance;Sitting Upper Body Dressing Details (indicate cue type and reason): donned gown                 Functional mobility during ADLs: Min guard;Rolling walker (2 wheels) General ADL Comments: patient performed most self care tasks seated due to fatigue following standing for cleaning    Extremity/Trunk Assessment              Vision       Perception  Praxis      Cognition Arousal/Alertness: Awake/alert Behavior During Therapy: WFL for tasks assessed/performed Overall Cognitive Status: History of cognitive impairments - at baseline                                 General Comments: hx of dementia, likely baseline, pleasant          Exercises     Shoulder Instructions       General Comments      Pertinent Vitals/ Pain        Pain Assessment: No/denies pain  Home Living                                          Prior Functioning/Environment              Frequency  Min 2X/week        Progress Toward Goals  OT Goals(current goals can now be found in the care plan section)  Progress towards OT goals: Progressing toward goals  Acute Rehab OT Goals OT Goal Formulation: With patient Time For Goal Achievement: 03/07/21 Potential to Achieve Goals: Good ADL Goals Pt Will Perform Grooming: standing;with modified independence Pt Will Perform Lower Body Bathing: with modified independence;sit to/from stand Pt Will Perform Lower Body Dressing: with modified independence;sit to/from stand Pt Will Transfer to Toilet: with modified independence;ambulating;regular height toilet Pt Will Perform Toileting - Clothing Manipulation and hygiene: with modified independence;sit to/from stand  Plan Discharge plan remains appropriate    Co-evaluation                 AM-PAC OT "6 Clicks" Daily Activity     Outcome Measure   Help from another person eating meals?: None Help from another person taking care of personal grooming?: A Little Help from another person toileting, which includes using toliet, bedpan, or urinal?: A Lot Help from another person bathing (including washing, rinsing, drying)?: A Lot Help from another person to put on and taking off regular upper body clothing?: A Little Help from another person to put on and taking off regular lower body clothing?: Total 6 Click Score: 15    End of Session Equipment Utilized During Treatment: Gait belt;Rolling walker (2 wheels)  OT Visit Diagnosis: Unsteadiness on feet (R26.81);Other abnormalities of gait and mobility (R26.89);Muscle weakness (generalized) (M62.81);Other symptoms and signs involving cognitive function   Activity Tolerance Patient tolerated treatment well   Patient Left in chair;with call bell/phone within reach;with  chair alarm set   Nurse Communication Mobility status        Time: 3086-5784 OT Time Calculation (min): 30 min  Charges: OT General Charges $OT Visit: 1 Visit OT Treatments $Self Care/Home Management : 23-37 mins  Alfonse Flavors, OTA Acute Rehabilitation Services  Pager [phone] Office [phone]   Dewain Penning 02/23/2021, 11:20 AM

## 2021-02-23 NOTE — Consult Note (Addendum)
Cardiology Consultation:   Patient ID: [name] [name] MRN: [id number]; DOB: 1924-04-25  Admit date: 02/20/2021 Date of Consult: 02/23/2021  PCP:  Janora Norlander, DO   [facility] Providers Cardiologist:  None  Electrophysiologist:  Thompson Grayer, MD    Patient Profile:   [name] [name] is a 87 y.o. female with a hx of HTN, HLD, AFib, CHB w/PPM who is being seen 02/23/2021 for the evaluation of abnormal echo at the request of Dr. Nevada Crane.  History of Present Illness:   Ms. Lucks last saw Dr. Rayann Heman via tele health visit Feb 2021, at that time she was doing well, without complaints, up to date with remotes noting normal device function, a/c with Eliquis for her PAF  Of late she has been following with Dr. Sharol Given for a traumatic wound left foot (bee sting?), initially treated with amoxicillin (?) > doxycycline for 7 days 02/15/21 wound was debrided She saw Dr. Sharol Given 02/20/21 for revisit of the abscess 02/19/21 wound culture came back with  Pseudomonas putida and changed to Cipro Seems at her visit with Dr. Sharol Given, she reported a couple falls and symptoms that were possibly concerning for heart failure and referred to the ER  She was admitted to [facility] 02/20/21, she had a fall that morning with hip pain unable to get herself up, she was found confused and also felt to be volume OL by exam and CXR and admitted with sepsis with hypothermia, new leukocytosis, elevated lactate, signs of endorgan damage of acute metabolic encephalopathy/confusion and AKI Family reported some degree of edema is her longstanding baseline, given sepsis no diuretic planned initially without symptoms of CP or SOB ABX: vanc and cefepime For her AFib with lower BPs and volume OL her dilt held, metoprolol continued, and eliquis held for heparin gtt pending potential needs for procedures.  TTE was done LVEF 25-30% with smal mobile echodensity on lead noted thrombus vs veg EP is asked see her   blood cultures drawn  02/20/21: neg (x2) for 2 days   LABS K+ 4.0 BUN/Creat 45/0.68 WBC 7.1 H/H 11/35 Plts 71 (not known for her, pre-admission 128), Heparin gtt stopped > Eliquis   The patient is in bedside chair, eating.   She denies feeling SOB or having any CP Son/daughter in law are bedside they have not noted any obvious SOB, she had not complained to them of any. 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   Hyperlipidemia    Hypertension    Paroxysmal atrial fibrillation ([place]) 2016   single episode detected on PPM interrogation    Past Surgical History:  Procedure Laterality Date   ABDOMINAL HYSTERECTOMY     EYE SURGERY Bilateral    cataracts   GALLBLADDER SURGERY     Normal coronary angiography  1999   PPM GENERATOR CHANGEOUT N/A 01/15/2017   [employer] Accolade MRI EL DL pacemaker for CHB by Dr Rayann Heman   PULSE GENERATOR IMPLANT     Guidant Ultra dual-mode, dual-pacing, dual-sensing generator for complete atrioventricular block 2006 with some noise on atrial lead and generator recall for very low incidence of loss of output     Home Medications:  Prior to Admission medications   Medication Sig Start Date End Date Taking? Authorizing Provider  apixaban (ELIQUIS) 5 MG TABS tablet Take 1 tablet (5 mg total) by mouth  2 (two) times daily. (Needs to be seen before next refill) 09/29/20  Yes Ronnie Doss M, DO  atorvastatin (LIPITOR) 10 MG tablet Take 1 tablet (10 mg total) by mouth every evening. 10/30/20  Yes Ronnie Doss M, DO  Cholecalciferol (VITAMIN D3) 1.25 MG (50000 UT) CAPS TAKE 1 CAPSULE (50,000 UNITS TOTAL) BY MOUTH EVERY 7 (SEVEN) DAYS. X12 WEEKS Patient taking differently: Take 50,000 Units by mouth every Friday. 08/02/19  Yes Gottschalk, Ashly M, DO  diltiazem (CARDIZEM CD) 120 MG 24 hr capsule TAKE 1 CAPSULE BY MOUTH EVERY  DAY 09/14/20  Yes Gottschalk, Ashly M, DO  dorzolamide-timolol (COSOPT) 22.3-6.8 MG/ML ophthalmic solution Place 1 drop into both eyes 2 (two) times daily. 04/29/19  Yes [provider]  hydrochlorothiazide (HYDRODIURIL) 12.5 MG tablet TAKE 1 TABLET BY MOUTH EVERY DAY 09/14/20  Yes Ronnie Doss M, DO  levothyroxine (SYNTHROID) 75 MCG tablet Take 1 tablet (75 mcg total) by mouth daily before breakfast. 11/29/20  Yes Gottschalk, Ashly M, DO  memantine (NAMENDA) 5 MG tablet Take 1 tablet (5 mg total) by mouth daily for 30 days, THEN 1 tablet (5 mg total) 2 (two) times daily. 01/10/21 03/11/21 Yes Gottschalk, Leatrice Jewels M, DO  silver sulfADIAZINE (SILVADENE) 1 % cream Apply 1 application topically daily. Apply to affected area daily plus dry dressing Patient taking differently: Apply 1 application topically daily. Apply to left foot daily plus dry dressing 02/15/21  Yes Newt Minion, MD  ciprofloxacin (CIPRO) 500 MG tablet Take 1 tablet (500 mg total) by mouth 2 (two) times daily. Twice a day for 2 weeks. Patient not taking: Reported on 02/20/2021 02/19/21   Newt Minion, MD  doxycycline (VIBRA-TABS) 100 MG tablet Take 1 tablet (100 mg total) by mouth 2 (two) times daily for 10 days. 1 po bid 02/14/21 02/24/21  Baruch Gouty, FNP    Inpatient Medications: Scheduled Meds:  apixaban  2.5 mg Oral BID   feeding supplement  237 mL Oral BID BM   levothyroxine  75 mcg Oral QAC breakfast   memantine  5 mg Oral QHS   multivitamin with minerals  1 tablet Oral Daily   nutrition supplement (JUVEN)  1 packet Oral BID BM   silver sulfADIAZINE   Topical Daily   sodium bicarbonate  1,300 mg Oral BID   Continuous Infusions:  sodium chloride Stopped (02/21/21 0947)   ceFEPime (MAXIPIME) IV     vancomycin 750 mg (02/22/21 1340)   PRN Meds: sodium chloride, acetaminophen **OR** acetaminophen, HYDROmorphone (DILAUDID) injection, ondansetron **OR** ondansetron (ZOFRAN) IV  Allergies:   No Known  Allergies  Social History:   Social History   Socioeconomic History   Marital status: Widowed    Spouse name: Not on file   Number of children: 2   Years of education: Not on file   Highest education level: Not on file  Occupational History   Not on file  Tobacco Use   Smoking status: Never   Smokeless tobacco: Never  Substance and Sexual Activity   Alcohol use: No    Alcohol/week: 0.0 standard drinks   Drug use: No   Sexual activity: Not on file  Other Topics Concern   Not on file  Social History Narrative   Lives alone   Son lives next door, daughter lives in [place]   Social Determinants of Health   Financial Resource Strain: Low Risk    Difficulty of Paying Living Expenses: Not hard at all  Food Insecurity: No Food Insecurity  Worried About Programme researcher, broadcasting/film/video in the Last Year: Never true   [employer] of Food in the Last Year: Never true  Transportation Needs: No Transportation Needs   Lack of Transportation (Medical): No   Lack of Transportation (Non-Medical): No  Physical Activity: Insufficiently Active   Days of Exercise per Week: 7 days   Minutes of Exercise per Session: 20 min  Stress: No Stress Concern Present   Feeling of Stress : Only a little  Social Connections: Moderately Integrated   Frequency of Communication with Friends and Family: More than three times a week   Frequency of Social Gatherings with Friends and Family: More than three times a week   Attends Religious Services: More than 4 times per year   Active Member of [employer] or Organizations: Yes   Attends Banker Meetings: More than 4 times per year   Marital Status: Widowed  Catering manager Violence: Not At Risk   Fear of Current or Ex-Partner: No   Emotionally Abused: No   Physically Abused: No   Sexually Abused: No    Family History:   Family History  Problem Relation Age of Onset   Cancer Sister        breast     ROS:  Please see the history of present illness.   All other ROS reviewed and negative.     Physical Exam/Data:   Vitals:   02/22/21 2017 02/23/21 0224 02/23/21 0622 02/23/21 0624  BP: (!) 108/58  (!) 100/57 109/61  Pulse: 62  (!) 106 63  Resp: 15  17 17   Temp: (!) 97.5 F (36.4 C)  (!) 97.5 F (36.4 C) (!) 97.5 F (36.4 C)  TempSrc: Oral  Oral Oral  SpO2: 97%  99% 99%  Weight:  46.3 kg    Height:        Intake/Output Summary (Last 24 hours) at 02/23/2021 1152 Last data filed at 02/23/2021 02/25/2021 Gross per 24 hour  Intake 685.68 ml  Output 700 ml  Net -14.32 ml   Last 3 Weights 02/23/2021 02/22/2021 02/21/2021  Weight (lbs) 102 lb 1.2 oz 98 lb 1.7 oz 96 lb 1.9 oz  Weight (kg) 46.3 kg 44.5 kg 43.6 kg     Body mass index is 19.29 kg/m.  General:  Well nourished, well developed, in no acute distress HEENT: normal Neck: no JVD Vascular: No carotid bruits Cardiac:  irreg-irreg; 2/6 SM Lungs:  diminished at the bases, soft crackles, no wheezing, rhonchi or rales  Abd: soft, nontender, no hepatomegaly  Ext: trace-1+ b/l, edema, L foot is wrapped Musculoskeletal:  No deformities, very petite w/advanced atrophy Skin: warm and dry  Neuro:  HOH at baseline, otherwise no gross focal abnormalities noted Psych:  Normal affect   EKG:  The EKG was personally reviewed and demonstrates:   AFib V paced  Telemetry:  Telemetry was personally reviewed and demonstrates:   AFib, often but not always pacing  Relevant CV Studies:  02/21/21: TTE IMPRESSIONS   1. Left ventricular ejection fraction, by estimation, is 25 to 30%. The  left ventricle has severely decreased function. The left ventricle has no  regional wall motion abnormalities. The left ventricular internal cavity  size was mildly dilated. There is  mild left ventricular hypertrophy. Left ventricular diastolic parameters  are indeterminate.   2. Right ventricular systolic function is mildly reduced. The right  ventricular size is moderately enlarged. There is mildly  elevated  pulmonary artery systolic pressure. The estimated right  ventricular  systolic pressure is [id number] mmHg.   3. Small mobil echodensity adherent to pacemaker lead in right atrium  (image 30), could represent thrombus or vegetation, would recommend  checking blood cultures   4. Left atrial size was severely dilated.   5. Right atrial size was severely dilated.   6. The mitral valve is abnormal. Severe mitral valve regurgitation.  Appears functional. No evidence of mitral stenosis.   7. Tricuspid valve regurgitation is severe. Hepatic vein systolic flow  reversal.   8. The aortic valve is tricuspid. Aortic valve regurgitation is not  visualized. Aortic valve sclerosis is present, with no evidence of aortic  valve stenosis.   9. The inferior vena cava is dilated in size with <50% respiratory  variability, suggesting right atrial pressure of 15 mmHg.   Laboratory Data:  High Sensitivity Troponin:   Recent Labs  Lab 02/20/21 1537  TROPONINIHS 33*     Chemistry Recent Labs  Lab 02/21/21 0324 02/22/21 0225 02/23/21 0346  NA 137 137 138  K 3.7 3.7 4.0  CL 107 109 110  CO2 18* 19* 22  GLUCOSE 66* 104* 92  BUN 33* 46* 45*  CREATININE 1.12* 0.89 0.68  CALCIUM 9.6 9.8 9.3  GFRNONAA 46* >60 >60  ANIONGAP 12 9 6     Recent Labs  Lab 02/21/21 0324 02/22/21 0225 02/23/21 0346  PROT 5.8* 5.3* 4.8*  ALBUMIN 3.9 3.4* 2.9*  AST 70* 55* 40  ALT 39 36 33  ALKPHOS 72 73 63  BILITOT 1.9* 1.2 1.3*   Lipids No results for input(s): CHOL, TRIG, HDL, LABVLDL, LDLCALC, CHOLHDL in the last 168 hours.  Hematology Recent Labs  Lab 02/21/21 0324 02/22/21 0225 02/23/21 0346  WBC 8.2 8.8 7.1  RBC 3.90 4.18 3.56*  HGB 13.1 14.1 11.9*  HCT 37.7 40.5 35.1*  MCV 96.7 96.9 98.6  MCH 33.6 33.7 33.4  MCHC 34.7 34.8 33.9  RDW 17.4* 17.7* 17.7*  PLT 89* 78* 71*   Thyroid  Recent Labs  Lab 02/20/21 1537  TSH 6.505*    BNPNo results for input(s): BNP, PROBNP in the last 168 hours.   DDimer No results for input(s): DDIMER in the last 168 hours.   Radiology/Studies:  CT HEAD WO CONTRAST Result Date: 02/20/2021 CLINICAL DATA:  Head trauma EXAM: CT HEAD WITHOUT CONTRAST TECHNIQUE: Contiguous axial images were obtained from the base of the skull through the vertex without intravenous contrast. COMPARISON:  None. FINDINGS: Brain: No acute intracranial hemorrhage, mass effect, or herniation. No extra-axial fluid collections. No evidence of acute territorial infarct. No hydrocephalus. Mild cortical volume loss. Patchy hypodensities in the periventricular and subcortical white matter, likely secondary to chronic microvascular ischemic changes. Vascular: No hyperdense vessel or unexpected calcification. Skull: Normal. Negative for fracture or focal lesion. Sinuses/Orbits: No acute process identified. Surgical changes in the globes. Other: None. IMPRESSION: Chronic changes with no acute intracranial process identified. Electronically Signed   By: Ofilia Neas M.D.   On: 02/20/2021 10:59   DG Pelvis Portable Result Date: 02/20/2021 CLINICAL DATA:  Trauma, fall EXAM: PORTABLE PELVIS 1-2 VIEWS COMPARISON:  None. FINDINGS: No displaced fracture or dislocation is seen. There is a faint linear lucency in the lateral aspect of greater trochanter in the left femur. Degenerative changes are noted in the lower lumbar spine. IMPRESSION: No displaced fracture or dislocation is seen. There is linear lucency seen in the greater trochanter of the proximal left femur which may be an artifact due to confluence of soft  tissues over the bony margin or suggest undisplaced fracture. Please correlate with clinical symptoms and physical examination findings. If there are focal symptoms in the area of greater trochanter of proximal left femur, repeat radiographic examination and CT if warranted should be considered. Electronically Signed   By: Elmer Picker M.D.   On: 02/20/2021 10:46   CT HIP LEFT WO  CONTRAST Result Date: 02/20/2021 CLINICAL DATA:  [place]. EXAM: CT OF THE LEFT HIP WITHOUT CONTRAST, CT OF THE RIGHT HIP WITHOUT CONTRAST TECHNIQUE: Multidetector CT imaging of BOTH HIPS was performed according to the standard protocol. Multiplanar CT image reconstructions were also generated. COMPARISON:  Radiographs 02/20/2021 FINDINGS: Both hips are normally located. Mild bilateral hip joint degenerative changes given the patient's age. No acute hip fracture or evidence of AVN. The bony pelvis is intact. No pelvic fractures are identified. Moderate degenerative changes at the pubic symphysis. The SI joints are intact. The hip and pelvic musculature are grossly normal by CT. No obvious muscle tear or intramuscular hematoma. Mom no inguinal mass or hernia. No significant intrapelvic abnormalities. IMPRESSION: 1. Mild bilateral hip joint degenerative changes. 2. No acute hip fracture or evidence of AVN. 3. No pelvic fractures are identified. Electronically Signed   By: Marijo Sanes M.D.   On: 02/20/2021 13:57   CT HIP RIGHT WO CONTRAST Result Date: 02/20/2021 CLINICAL DATA:  [place]. EXAM: CT OF THE LEFT HIP WITHOUT CONTRAST, CT OF THE RIGHT HIP WITHOUT CONTRAST TECHNIQUE: Multidetector CT imaging of BOTH HIPS was performed according to the standard protocol. Multiplanar CT image reconstructions were also generated. COMPARISON:  Radiographs 02/20/2021 FINDINGS: Both hips are normally located. Mild bilateral hip joint degenerative changes given the patient's age. No acute hip fracture or evidence of AVN. The bony pelvis is intact. No pelvic fractures are identified. Moderate degenerative changes at the pubic symphysis. The SI joints are intact. The hip and pelvic musculature are grossly normal by CT. No obvious muscle tear or intramuscular hematoma. Mom no inguinal mass or hernia. No significant intrapelvic abnormalities. IMPRESSION: 1. Mild bilateral hip joint degenerative changes. 2. No acute hip fracture or  evidence of AVN. 3. No pelvic fractures are identified. Electronically Signed   By: Marijo Sanes M.D.   On: 02/20/2021 13:57   DG Chest Port 1 View Result Date: 02/20/2021 CLINICAL DATA:  Trauma, sepsis EXAM: PORTABLE CHEST 1 VIEW COMPARISON:  None FINDINGS: Transverse diameter of heart is increased. Central pulmonary vessels are prominent. There are no signs of alveolar pulmonary edema. Small bilateral pleural effusions are seen, more so on the left side. Evaluation of lower lung fields for infiltrates is limited by pleural effusions. There is no pneumothorax. Pacemaker battery is seen in the left infraclavicular region with tips of leads in the right atrium and right ventricle. IMPRESSION: Cardiomegaly. Small bilateral pleural effusions, more so on the left side. There are no signs of alveolar pulmonary edema. Evaluation of lower lung fields for infiltrates is limited by the effusions. Electronically Signed   By: Elmer Picker M.D.   On: 02/20/2021 10:42   VAS [place] ABI WITH/WO TBI Result Date: 02/22/2021  LOWER EXTREMITY DOPPLER STUDY Patient Name:  Elyza Bibler Hashemi  Date of Exam:   02/21/2021 Medical Rec #: [id number]       Accession #:    [id number] Date of Birth: 10-10-1924       Patient Gender: F Patient Age:   72 years Exam Location:  [facility] Procedure:      VAS  [place] ABI WITH/WO TBI Referring Phys: Wynetta Fines --------------------------------------------------------------------------------  Indications: Non-healing wound on dorsum of LT foot. High Risk Factors: Hypertension, hyperlipidemia.  Limitations: Today's exam was limited due to an open wound and bandages. Comparison Study: No prior studies. Performing Technologist: Darlin Coco RDMS RVT  Examination Guidelines: A complete evaluation includes at minimum, Doppler waveform signals and systolic blood pressure reading at the level of bilateral brachial, anterior tibial, and posterior tibial arteries, when vessel segments are accessible.  Bilateral testing is considered an integral part of a complete examination. Photoelectric Plethysmograph (PPG) waveforms and toe systolic pressure readings are included as required and additional duplex testing as needed. Limited examinations for reoccurring indications may be performed as noted.   Summary: Right: Resting right ankle-brachial index is within normal range. No evidence of significant right lower extremity arterial disease. The right toe-brachial index is abnormal. Although ankle brachial indices are within normal limits (0.95-1.29), arterial Doppler waveforms at the ankle suggest some component of arterial occlusive disease.  Left: Resting left ankle-brachial index indicates noncompressible left lower extremity arteries. The left toe-brachial index is normal.  *See table(s) above for measurements and observations.  Electronically signed by Harold Barban MD on 02/22/2021 at 12:51:16 AM.    Final      Assessment and Plan:   Sepsis S/p IVF On antibiotics L foot abscess felt to be etiology Wound cx done out patient + for pseudomonas BC drawn here negative so far though she had bene on abx out patient ID consulted, pending their visit  I suspect she will need TEE  Consideration for device extraction Will get device checked, evaluate for dependency   Longstanding persistent  (likely permanent) AFib On Eliquis at home > heparin gtt here Plates down, heparin stopped and her home eliquis resumed this AM  Cardiomyopathy Presumed new Congestive heart failure (acute) No SOB She has some edema BS diminished at the bases She also did get IVF on admission for management of sepsis No old echo is available for review Strict I/O  Dr. Lovena Le will see later today    Risk Assessment/Risk Scores:   For questions or updates, please contact [facility] Please consult [email or web address] for contact info under    Signed, Baldwin Jamaica, PA-C  02/23/2021 11:53 AM;  EP  Attending  Patient seen and examined. Agree with above. The patient is referred for consideration for ICD extraction. She is a very frail 87 yo woman with CHB, s/p PPM insertion who has peripheral vascular disease, and developed an abscess and has been admitted for treatment of sepsis. She has had a TTE which demonstrates a probable vegetation and her wound demonstrates Pseudomonas. On exam she is a frail appearing 87 yo woman NAD. Lungs are clear and CV with a RRR and extremities are edematous with a bandage on the left lower leg. Tele demonstrates ventricular pacing. Labs and echo are reviewed. 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 I would recommend suppressive anti-biotics directed against Pseudomonas in the absence of culture data. She is nearing consideration for palliative care/hospice. Her prognosis long term is very poor.  Carleene Overlie Maliea Grandmaison,MD

## 2021-02-23 NOTE — Social Work (Signed)
CSW went to speak with pt daughter and son in room, pt son was in room about to leave and asked if CSW could meet with the tomorrow bc his sister wanted to be there but she already left. Pt son and daughter will both be at he hospital at 40 and would like to meet with CSW on Saturday about placement and options for pt upon DC.

## 2021-02-23 NOTE — Progress Notes (Signed)
Physical Therapy Treatment Patient Details Name: [name] [name] MRN: [id number] DOB: 1924/07/01 Today's Date: 02/23/2021   History of Present Illness Pt is a 87 year old woman admitted after falling on 02/20/21.  Pt + sepsis PMH: non healing wound on dorsum of L foot sustained when walking in her yard barefoot and encountering yellow jackets, has been being treated by PCP and orthopedist wth outpt debridement, HLD, PAF, hypothyroid, SSS with pacemaker, mild dementia.    PT Comments    Pt is making good progress with mobility as she was able to ambulate without UE support this date, but displayed increased unsteadiness when ambulating without an AD compared to when using her RW, especially when trying to change her head position to observe her surroundings. Pt and family report she is able to ambulate outdoors on uneven surfaces without UE support at baseline. Pt displays deficits in balance and activity tolerance compared to baseline, fatiguing after ambulating up to ~60-65 ft this date. Recommending pt use a rollator for improved safety with mobility at d/c along with intermittent supervision. Pt would benefit from [facility] services as she does live alone and her family has concerns about her safety as they believe she has been having more frequent falls. Family is interested in pt receiving short-term rehab at a SNF at d/c though, which pt could potentially benefit from due to likely needing wound care and functioning below her baseline if they decide to pursue that route. However, still recommending follow-up with HHPT at this time. Will continue to follow acutely.     Recommendations for follow up therapy are one component of a multi-disciplinary discharge planning process, led by the attending physician.  Recommendations may be updated based on patient status, additional functional criteria and insurance authorization.  Follow Up Recommendations  Home health PT (with [facility] services; Family interested in  SNF though)     Assistance Recommended at Discharge Intermittent Supervision/Assistance  Equipment Recommendations  Rollator (4 wheels)    Recommendations for Other Services       Precautions / Restrictions Precautions Precautions: Fall Restrictions Weight Bearing Restrictions: No     Mobility  Bed Mobility Overal bed mobility: Needs Assistance Bed Mobility: Supine to Sit     Supine to sit: Min assist     General bed mobility comments: Pt sitting up in recliner upon arrival.    Transfers Overall transfer level: Needs assistance Equipment used: Rolling walker (2 wheels);None Transfers: Sit to/from Stand Sit to Stand: Min guard           General transfer comment: Extra time for pt to push up and stand, min guard for safety, no LOB.    Ambulation/Gait Ambulation/Gait assistance: Min guard;Min assist Gait Distance (Feet): 65 Feet (x2 bouts of ~65 ft > ~60 ft) Assistive device: Rolling walker (2 wheels);None Gait Pattern/deviations: Step-through pattern;Decreased stride length;Trunk flexed;Staggering left;Staggering right;Antalgic Gait velocity: reduced Gait velocity interpretation: <1.31 ft/sec, indicative of household ambulator   General Gait Details: Initially ambulating with RW at slow, but steady pace with slightly antalgic gait pattern. Cues provided to improve upright posture, poor carryover. Progressed to no UE support, but pt with increased unsteadiness, especially when changing head positions, causing LOB and needing up to minA to prevent LOB. Pt reports ambulating outdoors without AD at baseline.   Stairs             Wheelchair Mobility    Modified Rankin (Stroke Patients Only)       Balance Overall balance assessment: Needs  assistance Sitting-balance support: No upper extremity supported;Feet supported Sitting balance-Leahy Scale: Good Sitting balance - Comments: able to sit on EOB unassisted   Standing balance support: No upper  extremity supported;Bilateral upper extremity supported Standing balance-Leahy Scale: Fair Standing balance comment: Able to ambulate without UE support but increased unsteadiness noted needing up to minA to prevent LOB. Improved stability when using RW.                            Cognition Arousal/Alertness: Awake/alert Behavior During Therapy: WFL for tasks assessed/performed Overall Cognitive Status: History of cognitive impairments - at baseline                                 General Comments: hx of dementia, likely baseline, pleasant. Pt reported date was "february 2022" and she was at "[facility] Her children report this occurs often in which she knows all the info one moment then will forget and change the answers another moment.        Exercises      General Comments General comments (skin integrity, edema, etc.): Discussed options for follow-up PT at d/c including SNF vs HHPT with pt and family along with discussing option to think about ILF/ALF long-term if needed.      Pertinent Vitals/Pain Pain Assessment: Faces Faces Pain Scale: Hurts a little bit (denied pain but displayed with mobility) Pain Location: L foot Pain Descriptors / Indicators: Guarding Pain Intervention(s): Limited activity within patient's tolerance;Monitored during session;Repositioned    Home Living                          Prior Function            PT Goals (current goals can now be found in the care plan section) Acute Rehab PT Goals Patient Stated Goal: to get better PT Goal Formulation: With patient/family Time For Goal Achievement: 03/07/21 Potential to Achieve Goals: Good Progress towards PT goals: Progressing toward goals    Frequency    Min 3X/week      PT Plan Discharge plan needs to be updated;Equipment recommendations need to be updated    Co-evaluation              AM-PAC PT "6 Clicks" Mobility   Outcome Measure   Help needed turning from your back to your side while in a flat bed without using bedrails?: A Little Help needed moving from lying on your back to sitting on the side of a flat bed without using bedrails?: A Little Help needed moving to and from a bed to a chair (including a wheelchair)?: A Little Help needed standing up from a chair using your arms (e.g., wheelchair or bedside chair)?: A Little Help needed to walk in hospital room?: A Little Help needed climbing 3-5 steps with a railing? : A Little 6 Click Score: 18    End of Session Equipment Utilized During Treatment: Gait belt Activity Tolerance: Patient tolerated treatment well Patient left: in chair;with call bell/phone within reach;with chair alarm set;with family/visitor present Nurse Communication: Mobility status PT Visit Diagnosis: Other abnormalities of gait and mobility (R26.89);Muscle weakness (generalized) (M62.81);Unsteadiness on feet (R26.81);Difficulty in walking, not elsewhere classified (R26.2)     Time: 7741-2878 PT Time Calculation (min) (ACUTE ONLY): 29 min  Charges:  $Gait Training: 23-37 mins  Moishe Spice, PT, DPT Acute Rehabilitation Services  Pager: [phone] Office: [place] 02/23/2021, 2:20 PM

## 2021-02-23 NOTE — Progress Notes (Signed)
Mobility Specialist Progress Note    02/23/21 1642  Mobility  Activity Refused mobility   Pt stated she just doesn't want to go for a walk.   [facility] Mobility Specialist  M.S. Primary Phone: [phone] M.S. Secondary Phone: [phone]

## 2021-02-23 NOTE — Progress Notes (Addendum)
PROGRESS NOTE  [name] [name] [id number] DOB: [birth year] DOA: 02/20/2021 PCP: Janora Norlander, DO  HPI/Recap of past 24 hours: [name] [name] is a 87 y.o. female with medical history significant of left foot nonhealing wound x2 weeks, PAF on Eliquis, SSS status post PPM, HTN, HLD, dementia (lives alone), presented with fall at home.  Daughter reported that patient sustained an open wound of left dorsal foot about 2 weeks ago, for which patient completed 7 days of Augmentin and had outpatient debridement 5 days prior to presentation with Dr. Sharol Given.  Was taking doxycycline for last 4 days prior to presentation.  Found on the floor and complained about left hip pain and unable to get up herself.  In the ED found to be fluid overload, chest x-ray showed bilateral pleural effusion, 2+ pitting edema in lower extremity bilaterally, daughter reported the patient's legs "are always this big and puffy".  Patient was started on IV vancomycin and Zosyn empirically.  SUBJECTIVE: Patient was seen and examined at bedside.  Her son and daughter-in-law were present in the room. We had goals of care discussion regarding aggressive vs palliation and introduced concept of hospice.  Family elected to change code status to DNR.  Assessment/Plan: Principal Problem:   Sepsis ([place]) Active Problems:   Cold extremities   Recurrent falls   Ulcer of left foot with fat layer exposed ([place])  SSTI Sepsis, resolved Evidenced by leukocytosis, elevated lactate, signs of endorgan damage of acute metabolic encephalopathy/confusion and AKI, source is left foot deep wound infection - Outpatient superficial culture shows Pseudomonas on 02/15/21 - Blood cx NGTD - Continue cefepime, vanc dcd per ID - ID consulting; appreciate recs - Per Ortho, no plans for surgery; f/u outpatient, wound care   New/acute systolic CHF Daughter reported patient at baseline active, no complaint of chest pain or shortness of breath with  moderate activity. Could consider stress cardiomyopathy given acute illness. - TTE on 02/21/2021 showed LVEF 25 to 30%.   - can trial jardiance 10mg  given less BP effect and more diuresis; if not affordable or moving toward hospice, reasonable to stop  Lower extremity edema Has been going on for some time, likely impairing wound healing. Potentially related to heart failure vs venous insufficiency; liikely multifactorial - consider hose on non injured foot - diuretic prn  Abnormal Echocardiogram  Right atrium thrombus vs vegetation TTE w/small mobile echodensity adherent to pacemaker lead. - resume apixaban - EP consulted; would favor treating empirically rather than pursuing TEE and lead extraction given age, advanced dementia and risks associated w/procedure   Prerenal AKI, resolved prerenal  Likely secondary to dehydration from poor oral intake. - strict I/O - Continue to avoid nephrotoxic agents, dehydration and hypotension. - liberalize diet and encourage   Abnormal LFTS - No RUQ tenderness, likely secondary to decompensation of, and or mild rhabdomyolysis - Hydration with albumin prn   Left hip pain, POA - Pelvic x-ray non-conclusive. No fracture seen on CT scan. - pain meds prn, cool compress   A. fib, paroxysmal Rate controlled. - c/w apixaban 2.5mg  BID; no need for bridging heparin    HTN BP is at goal - Continue to hold off home HCTZ; resume as able   Mild rhabdomyolysis Likely related to Poor PO intake and AKI - CK downdrended - S/p hydration; no further IV fluid   Severe protein calorie malnutrition - Dietitian following - Encourage oral protein calorie intake - liberalize diet  Goals of care Discussed with patient's family  regarding aggressive vs more palliative measures. Recommended DNR, patient's family agreeable and are looking into directives.    DVT prophylaxis: Heparin drip Code Status: Full code Family Communication: Son and daughter-in-law at  bedside. Disposition Plan: Expect more than 2 midnight hospital stay Consults called: Orthopedic surgery, cardiology/EP, infectious disease. Admission status: Tele admit     Objective: Vitals:   02/23/21 0224 02/23/21 0622 02/23/21 0624 02/23/21 1245  BP:  (!) 100/57 109/61 (!) 100/58  Pulse:  (!) 106 63 (!) 52  Resp:  17 17 18   Temp:  (!) 97.5 F (36.4 C) (!) 97.5 F (36.4 C) 97.8 F (36.6 C)  TempSrc:  Oral Oral Oral  SpO2:  99% 99% 90%  Weight: 46.3 kg     Height:        Intake/Output Summary (Last 24 hours) at 02/23/2021 1545 Last data filed at 02/23/2021 [id number] Gross per 24 hour  Intake 340.84 ml  Output 600 ml  Net -259.16 ml    Filed Weights   02/21/21 0413 02/22/21 0425 02/23/21 0224  Weight: 43.6 kg 44.5 kg 46.3 kg    Exam:  General: 87 y.o. year-old female frail-appearing in no acute distress.  She is alert and pleasantly confused.   Cardiovascular: Regular rate and rhythm no rubs or gallops.  No JVD or thyromegaly noted. +systolic murmur Respiratory: Clear to auscultation no wheezes or rales. Abdomen: Soft nontender normal bowel sounds present.   Musculoskeletal: 1+ pitting edema lower extremities bilaterally.   Skin:  Psychiatry: Mood is appropriate for condition 7. Neuro: She moves all 4 extremities equally. Grip equal bilaterally   Data Reviewed: CBC: Recent Labs  Lab 02/20/21 0947 02/20/21 1021 02/21/21 0324 02/22/21 0225 02/23/21 0346  WBC 15.9*  --  8.2 8.8 7.1  NEUTROABS 14.3*  --   --   --   --   HGB 17.1* 18.0* 13.1 14.1 11.9*  HCT 50.9* 53.0* 37.7 40.5 35.1*  MCV 100.8*  --  96.7 96.9 98.6  PLT 106*  --  89* 78* 71*    Basic Metabolic Panel: Recent Labs  Lab 02/20/21 0947 02/20/21 1021 02/21/21 0324 02/22/21 0225 02/23/21 0346  NA 136 134* 137 137 138  K 4.5 4.4 3.7 3.7 4.0  CL 104 108 107 109 110  CO2 13*  --  18* 19* 22  GLUCOSE 96 96 66* 104* 92  BUN 35* 38* 33* 46* 45*  CREATININE 1.20* 0.90 1.12* 0.89 0.68   CALCIUM 10.0  --  9.6 9.8 9.3    GFR: Estimated Creatinine Clearance: 31.4 mL/min (by C-G formula based on SCr of 0.68 mg/dL). Liver Function Tests: Recent Labs  Lab 02/20/21 0947 02/21/21 0324 02/22/21 0225 02/23/21 0346  AST 88* 70* 55* 40  ALT 58* 39 36 33  ALKPHOS 101 72 73 63  BILITOT 1.7* 1.9* 1.2 1.3*  PROT 6.3* 5.8* 5.3* 4.8*  ALBUMIN 3.3* 3.9 3.4* 2.9*    No results for input(s): LIPASE, AMYLASE in the last 168 hours. No results for input(s): AMMONIA in the last 168 hours. Coagulation Profile: Recent Labs  Lab 02/20/21 1147 02/20/21 1229  INR 1.4* 1.4*    Cardiac Enzymes: Recent Labs  Lab 02/20/21 0947 02/21/21 0324 02/23/21 0912  CKTOTAL 977* 481* 65    BNP (last 3 results) No results for input(s): PROBNP in the last 8760 hours. HbA1C: No results for input(s): HGBA1C in the last 72 hours. CBG: No results for input(s): GLUCAP in the last 168 hours. Lipid Profile: No  results for input(s): CHOL, HDL, LDLCALC, TRIG, CHOLHDL, LDLDIRECT in the last 72 hours. Thyroid Function Tests: No results for input(s): TSH, T4TOTAL, FREET4, T3FREE, THYROIDAB in the last 72 hours.  Anemia Panel: No results for input(s): VITAMINB12, FOLATE, FERRITIN, TIBC, IRON, RETICCTPCT in the last 72 hours. Urine analysis: No results found for: COLORURINE, APPEARANCEUR, LABSPEC, PHURINE, GLUCOSEU, HGBUR, BILIRUBINUR, KETONESUR, PROTEINUR, UROBILINOGEN, NITRITE, LEUKOCYTESUR Sepsis Labs: @LABRCNTIP (procalcitonin:4,lacticidven:4)  ) Recent Results (from the past 240 hour(s))  Anaerobic and Aerobic Culture     Status: Abnormal   Collection Time: 02/14/21  4:46 PM   Specimen: Foot; Other   FO  Result Value Ref Range Status   Anaerobic Culture Final report  Final   Result 1 Comment  Final    Comment: No anaerobic growth in 72 hours.   Aerobic Culture Final report (A)  Final   Result 1 Pseudomonas putida (A)  Final    Comment: Heavy growth   Result 2 Mixed skin flora  Final     Comment: Light growth   Antimicrobial Susceptibility Comment  Final    Comment:       ** S = Susceptible; I = Intermediate; R = Resistant **                    P = Positive; N = Negative             MICS are expressed in micrograms per mL    Antibiotic                 RSLT#1    RSLT#2    RSLT#3    RSLT#4 Amikacin                       S Aztreonam                      I Cefepime                       S Cefotaxime                     S Ceftazidime                    S Ceftriaxone                    S Ciprofloxacin                  S Gentamicin                     S Imipenem                       S Levofloxacin                   S Meropenem                      S Piperacillin/Tazobactam        S Ticarcillin/Clavulanate        I Tobramycin                     S Trimethoprim/Sulfa             R   Wound culture     Status: Abnormal   Collection Time: 02/15/21  3:45 PM   Specimen:  Wound  Result Value Ref Range Status   MICRO NUMBER: [id number]  Final   SPECIMEN QUALITY: Adequate  Final   SOURCE: FOOT, LEFT  Final   STATUS: FINAL  Final   GRAM STAIN:   Final    Few White blood cells seen No epithelial cells seen Moderate Gram negative bacilli   ISOLATE 1: Pseudomonas putida (A)  Final    Comment: Heavy growth of Pseudomonas putida      Susceptibility   Pseudomonas putida - AEROBIC CULT, GRAM STAIN NEGATIVE 1    CEFTAZIDIME 2 Sensitive     CEFEPIME <=1 Sensitive     CIPROFLOXACIN <=0.25 Sensitive     LEVOFLOXACIN 1 Sensitive     GENTAMICIN <=1 Sensitive     PIP/TAZO 8 Sensitive     TOBRAMYCIN* <=1 Sensitive      * Legend: S = Susceptible  I = Intermediate R = Resistant  NS = Not susceptible * = Not tested  NR = Not reported **NN = See antimicrobic comments   Culture, blood (Routine x 2)     Status: None (Preliminary result)   Collection Time: 02/20/21 10:25 AM   Specimen: BLOOD  Result Value Ref Range Status   Specimen Description BLOOD LEFT ANTECUBITAL  Final   Special  Requests   Final    BOTTLES DRAWN AEROBIC AND ANAEROBIC Blood Culture adequate volume   Culture   Final    NO GROWTH 3 DAYS Performed at [facility], [address], [place], [place] [address]    Report Status PENDING  Incomplete  Culture, blood (Routine x 2)     Status: None (Preliminary result)   Collection Time: 02/20/21 10:36 AM   Specimen: BLOOD  Result Value Ref Range Status   Specimen Description BLOOD RIGHT ANTECUBITAL  Final   Special Requests   Final    BOTTLES DRAWN AEROBIC AND ANAEROBIC Blood Culture results may not be optimal due to an inadequate volume of blood received in culture bottles   Culture   Final    NO GROWTH 3 DAYS Performed at [facility], [place] [address], [place], [place] [address]    Report Status PENDING  Incomplete  Resp Panel by RT-PCR (Flu A&B, Covid) Nasopharyngeal Swab     Status: None   Collection Time: 02/20/21  3:34 PM   Specimen: Nasopharyngeal Swab; Nasopharyngeal(NP) swabs in vial transport medium  Result Value Ref Range Status   SARS Coronavirus 2 by RT PCR NEGATIVE NEGATIVE Final    Comment: (NOTE) SARS-CoV-2 target nucleic acids are NOT DETECTED.  The SARS-CoV-2 RNA is generally detectable in upper respiratory specimens during the acute phase of infection. The lowest concentration of SARS-CoV-2 viral copies this assay can detect is 138 copies/mL. A negative result does not preclude SARS-Cov-2 infection and should not be used as the sole basis for treatment or other patient management decisions. A negative result may occur with  improper specimen collection/handling, submission of specimen other than nasopharyngeal swab, presence of viral mutation(s) within the areas targeted by this assay, and inadequate number of viral copies(<138 copies/mL). A negative result must be combined with clinical observations, patient history, and epidemiological information. The expected result is Negative.  Fact Sheet for Patients:   EntrepreneurPulse.com.au  Fact Sheet for Healthcare Providers:  IncredibleEmployment.be  This test is no t yet approved or cleared by the [place] FDA and  has been authorized for detection and/or diagnosis of SARS-CoV-2 by FDA under an Emergency Use Authorization (  EUA). This EUA will remain  in effect (meaning this test can be used) for the duration of the COVID-19 declaration under Section 564(b)(1) of the Act, 21 U.S.C.section 360bbb-3(b)(1), unless the authorization is terminated  or revoked sooner.       Influenza A by PCR NEGATIVE NEGATIVE Final   Influenza B by PCR NEGATIVE NEGATIVE Final    Comment: (NOTE) The Xpert Xpress SARS-CoV-2/FLU/RSV plus assay is intended as an aid in the diagnosis of influenza from Nasopharyngeal swab specimens and should not be used as a sole basis for treatment. Nasal washings and aspirates are unacceptable for Xpert Xpress SARS-CoV-2/FLU/RSV testing.  Fact Sheet for Patients: EntrepreneurPulse.com.au  Fact Sheet for Healthcare Providers: IncredibleEmployment.be  This test is not yet approved or cleared by the [place] FDA and has been authorized for detection and/or diagnosis of SARS-CoV-2 by FDA under an Emergency Use Authorization (EUA). This EUA will remain in effect (meaning this test can be used) for the duration of the COVID-19 declaration under Section 564(b)(1) of the Act, 21 U.S.C. section 360bbb-3(b)(1), unless the authorization is terminated or revoked.  Performed at [facility], [place] [address] [place], [place] [address]   MRSA Next Gen by PCR, Nasal     Status: None   Collection Time: 02/22/21  4:59 PM   Specimen: Nasal Mucosa; Nasal Swab  Result Value Ref Range Status   MRSA by PCR Next Gen NOT DETECTED NOT DETECTED Final    Comment: (NOTE) The GeneXpert MRSA Assay (FDA approved for NASAL specimens only), is one component of a  comprehensive MRSA colonization surveillance program. It is not intended to diagnose MRSA infection nor to guide or monitor treatment for MRSA infections. Test performance is not FDA approved in patients less than 43 years old. Performed at [facility], [place] [address], [place], [place] [address]        Studies: No results found.  Scheduled Meds:  apixaban  2.5 mg Oral BID   feeding supplement  237 mL Oral BID BM   levothyroxine  75 mcg Oral QAC breakfast   memantine  5 mg Oral QHS   multivitamin with minerals  1 tablet Oral Daily   nutrition supplement (JUVEN)  1 packet Oral BID BM   silver sulfADIAZINE   Topical Daily   sodium bicarbonate  1,300 mg Oral BID    Continuous Infusions:  sodium chloride Stopped (02/21/21 0947)   ceFEPime (MAXIPIME) IV 2 g (02/23/21 1303)   vancomycin 750 mg (02/22/21 1340)     LOS: 3 days     Cecille Rubin, MD [facility] Pager [phone]  02/23/2021, 3:45 PM

## 2021-02-23 NOTE — Consult Note (Addendum)
Date of Admission:  02/20/2021          Reason for Consult:  Severe sepsis with chronic wound and exposed tendon and now ?  Vegetation on Pacemaker lead  Referring Provider: Irene Pap, MD and Cecille Rubin, MD   Assessment:  Severe sepsis with shock now volume resuscitated Left foot wound (after stepping on hornets nest) now with exposed tendon Possible infection of her pacemaker with possible vegetation seen on lead on 2D echocardiogram Advanced dementia Paroxysmal atrial fibrillation History of complete heart block  Plan:  Agree with the cefepime that is she is on currently Electrophysiology are seeing the patient for consideration of TEE and device extraction I would STRONGLY STRONGLY consider palliative care consult here.. I am very concerned that all of the aggressive interventions being considered may cause more harm than good. I certainly think that trying to give her a long IV antibiotics will not make a difference in her long-term outcome nor her quality of life and may in fact make her suffer more  Principal Problem:   Sepsis ([place]) Active Problems:   Cold extremities   Recurrent falls   Ulcer of left foot with fat layer exposed ([place])   Scheduled Meds:  apixaban  2.5 mg Oral BID   feeding supplement  237 mL Oral BID BM   levothyroxine  75 mcg Oral QAC breakfast   memantine  5 mg Oral QHS   multivitamin with minerals  1 tablet Oral Daily   nutrition supplement (JUVEN)  1 packet Oral BID BM   silver sulfADIAZINE   Topical Daily   sodium bicarbonate  1,300 mg Oral BID   Continuous Infusions:  sodium chloride Stopped (02/21/21 0947)   ceFEPime (MAXIPIME) IV 2 g (02/23/21 1303)   vancomycin 750 mg (02/22/21 1340)   PRN Meds:.sodium chloride, acetaminophen **OR** acetaminophen, HYDROmorphone (DILAUDID) injection, ondansetron **OR** ondansetron (ZOFRAN) IV  HPI: [name] [name] is a 87 y.o. female who apparently had stepped on a hornets nest in late October.   She was treated with prednisone and Zyrtec but then had worsening of her wound and was seen in the emergency department on November 8.  She had large blister with bullous component.  Dr. Eulis Foster with emergency medicine was able to aspirate and decompress this area.  Sent out on oral Augmentin.  Patient was separately seen by primary care physician who was concerned by the worsening appearance of the wound.  Dr. Hardie Shackleton consulted with the limb preservation unit and Dr. Sharol Given.  Wound culture was taken and doxycycline was added.  She was seen by Dr. Sharol Given on November 17 upon to have a large ulcer that was 4 x 6 cm.  There is blistering area was debrided to healthy tissue.  Culture was obtained.  Culture subsequently grew Pseudomonas putida.   She was seen in followup by Dr. Sharol Given on 02/20/2021.  At that point in time she had been found down b on several occasions by family members.  She had extensive venous insufficiency in her lower extremities with weeping edema to both legs.  She had had worsening of her left dorsal foot ulcer which now encompassed the near entirety of her dorsum of her foot with a visible extensor tendon but no exposed bone and no abscess.  She was severely ill when she saw Dr. Sharol Given in the office and we will could not stay upright in the chair. She had cold extremities and was subsidy sent to the emergency department.  The ER the patient was hypothermic with a temperature of 93.5 low blood pressures.  Patient was initiated on [facility] she had a CT of the head that was negative for bleed x-rays were negative for rib fractures.  Her white count was at 15,900 creatinine 1.2.  Blood cultures were taken and she was initially started on vancomycin and Zosyn.  Was also found to be volume overloaded.  Her blood cultures have not returned with any organism.  She has improved with restoration of her blood pressures and normothermia.  In the interim she has had a 2D echocardiogram that suggests a  possible vegetation on a lead of her pacemaker.  Of note she has a history of complete heart block for which she has the pacemaker in place.  She also has paroxysmal atrial fibrillation and is on Eliquis.  She  She does have advanced dementia.  I think cefepime is reasonable for now.  She seems to have improved on empiric antibiotics and her blood cultures are without growth to date.  Electrophysiology was seeing the patient and considering TEE and potential device extraction.  I am quite concerned about this patient's overall trajectory.  She seems globally to be nearing the end of her life.  I am perplexed as to why at her age and given her condition she is a full code and why SO MANY aggressive interventions are being considered.  I would think that device extraction in a patient with complete heart block with her age and comorbidities would be highly dangerous and not worth the risk.  I even would not consider doing a TEE.  She is not a suitable candidate for long-term IV antibiotics.  Maintaining at long-term IV and her will be very difficult and likely require her to be restrained.  I do not see how we are going to make ANY meaningful improvements to her short term or long term quality of life and certainly not to her life expectancy with aggressive measures.   What she needs above and beyond anything else is for palliative care to see her with discussion of goals of care.    I spent 82 minutes with the patient including than 50% of the time in face to face counseling of the patient regarding nature of her severe foot infection personally reviewing her chest x-ray CT of the hip updated blood cultures wound culture along with review of medical records in preparation for the visit and during the visit and in coordination of her care.    Review of Systems: Review of Systems  Unable to perform ROS: Dementia   Past Medical History:  Diagnosis Date   Atrioventricular block,  complete ([place]) 2006   Hyperlipidemia    Hypertension    Paroxysmal atrial fibrillation ([place]) 2016   single episode detected on PPM interrogation    Social History   Tobacco Use   Smoking status: Never   Smokeless tobacco: Never  Substance Use Topics   Alcohol use: No    Alcohol/week: 0.0 standard drinks   Drug use: No    Family History  Problem Relation Age of Onset   Cancer Sister        breast   No Known Allergies  OBJECTIVE: Blood pressure (!) 100/58, pulse (!) 52, temperature 97.8 F (36.6 C), temperature source Oral, resp. rate 18, height 5\' 1"  (1.549 m), weight 46.3 kg, SpO2 90 %.  Physical Exam Constitutional:      General: She is not in acute distress.  Appearance: She is well-developed. She is not ill-appearing or diaphoretic.  HENT:     Head: Normocephalic and atraumatic.     Right Ear: Hearing and external ear normal.     Left Ear: Hearing and external ear normal.     Nose: No nasal deformity or rhinorrhea.  Eyes:     General: No scleral icterus.    Conjunctiva/sclera: Conjunctivae normal.     Right eye: Right conjunctiva is not injected.     Left eye: Left conjunctiva is not injected.     Pupils: Pupils are equal, round, and reactive to light.  Neck:     Vascular: No JVD.  Cardiovascular:     Rate and Rhythm: Normal rate. Rhythm irregular.     Heart sounds: Normal heart sounds, S1 normal and S2 normal. No murmur heard.   No friction rub. No gallop.  Pulmonary:     Effort: Pulmonary effort is normal. No respiratory distress.     Breath sounds: Normal breath sounds. No stridor. No wheezing or rhonchi.  Abdominal:     General: Bowel sounds are normal. There is no distension.     Palpations: Abdomen is soft.     Tenderness: There is no abdominal tenderness.  Musculoskeletal:        General: Normal range of motion.     Right shoulder: Normal.     Left shoulder: Normal.     Cervical back: Normal range of motion and neck supple.     Right hip:  Normal.     Left hip: Normal.     Right knee: Normal.     Left knee: Normal.  Lymphadenopathy:     Head:     Right side of head: No submandibular, preauricular or posterior auricular adenopathy.     Left side of head: No submandibular, preauricular or posterior auricular adenopathy.     Cervical: No cervical adenopathy.     Right cervical: No superficial or deep cervical adenopathy.    Left cervical: No superficial or deep cervical adenopathy.  Skin:    General: Skin is warm and dry.     Findings: No abrasion or ecchymosis.     Nails: There is no clubbing.  Neurological:     General: No focal deficit present.     Mental Status: She is alert.  Psychiatric:        Attention and Perception: Attention normal. She is attentive.        Mood and Affect: Mood normal.        Speech: Speech is delayed.        Behavior: Behavior is cooperative.        Cognition and Memory: Cognition is impaired. Memory is impaired. She exhibits impaired recent memory and impaired remote memory.        Judgment: Judgment normal.   Pacemaker pocket is not overtly infected  Left wound is bandaged Lab Results Lab Results  Component Value Date   WBC 7.1 02/23/2021   HGB 11.9 (L) 02/23/2021   HCT 35.1 (L) 02/23/2021   MCV 98.6 02/23/2021   PLT 71 (L) 02/23/2021    Lab Results  Component Value Date   CREATININE 0.68 02/23/2021   BUN 45 (H) 02/23/2021   NA 138 02/23/2021   K 4.0 02/23/2021   CL 110 02/23/2021   CO2 22 02/23/2021    Lab Results  Component Value Date   ALT 33 02/23/2021   AST 40 02/23/2021   ALKPHOS 63 02/23/2021   BILITOT  1.3 (H) 02/23/2021     Microbiology: Recent Results (from the past 240 hour(s))  Anaerobic and Aerobic Culture     Status: Abnormal   Collection Time: 02/14/21  4:46 PM   Specimen: Foot; Other   FO  Result Value Ref Range Status   Anaerobic Culture Final report  Final   Result 1 Comment  Final    Comment: No anaerobic growth in 72 hours.   Aerobic  Culture Final report (A)  Final   Result 1 Pseudomonas putida (A)  Final    Comment: Heavy growth   Result 2 Mixed skin flora  Final    Comment: Light growth   Antimicrobial Susceptibility Comment  Final    Comment:       ** S = Susceptible; I = Intermediate; R = Resistant **                    P = Positive; N = Negative             MICS are expressed in micrograms per mL    Antibiotic                 RSLT#1    RSLT#2    RSLT#3    RSLT#4 Amikacin                       S Aztreonam                      I Cefepime                       S Cefotaxime                     S Ceftazidime                    S Ceftriaxone                    S Ciprofloxacin                  S Gentamicin                     S Imipenem                       S Levofloxacin                   S Meropenem                      S Piperacillin/Tazobactam        S Ticarcillin/Clavulanate        I Tobramycin                     S Trimethoprim/Sulfa             R   Wound culture     Status: Abnormal   Collection Time: 02/15/21  3:45 PM   Specimen: Wound  Result Value Ref Range Status   MICRO NUMBER: [id number]  Final   SPECIMEN QUALITY: Adequate  Final   SOURCE: FOOT, LEFT  Final   STATUS: FINAL  Final   GRAM STAIN:   Final    Few White blood cells seen No epithelial cells seen Moderate Gram negative bacilli   ISOLATE 1: Pseudomonas putida (A)  Final    Comment: Heavy growth of Pseudomonas putida      Susceptibility   Pseudomonas putida - AEROBIC CULT, GRAM STAIN NEGATIVE 1    CEFTAZIDIME 2 Sensitive     CEFEPIME <=1 Sensitive     CIPROFLOXACIN <=0.25 Sensitive     LEVOFLOXACIN 1 Sensitive     GENTAMICIN <=1 Sensitive     PIP/TAZO 8 Sensitive     TOBRAMYCIN* <=1 Sensitive      * Legend: S = Susceptible  I = Intermediate R = Resistant  NS = Not susceptible * = Not tested  NR = Not reported **NN = See antimicrobic comments   Culture, blood (Routine x 2)     Status: None (Preliminary result)   Collection  Time: 02/20/21 10:25 AM   Specimen: BLOOD  Result Value Ref Range Status   Specimen Description BLOOD LEFT ANTECUBITAL  Final   Special Requests   Final    BOTTLES DRAWN AEROBIC AND ANAEROBIC Blood Culture adequate volume   Culture   Final    NO GROWTH 2 DAYS Performed at [facility], [place] [address], [place], [place] [address]    Report Status PENDING  Incomplete  Culture, blood (Routine x 2)     Status: None (Preliminary result)   Collection Time: 02/20/21 10:36 AM   Specimen: BLOOD  Result Value Ref Range Status   Specimen Description BLOOD RIGHT ANTECUBITAL  Final   Special Requests   Final    BOTTLES DRAWN AEROBIC AND ANAEROBIC Blood Culture results may not be optimal due to an inadequate volume of blood received in culture bottles   Culture   Final    NO GROWTH 2 DAYS Performed at [facility], [place] [address], [place], [place] [address]    Report Status PENDING  Incomplete  Resp Panel by RT-PCR (Flu A&B, Covid) Nasopharyngeal Swab     Status: None   Collection Time: 02/20/21  3:34 PM   Specimen: Nasopharyngeal Swab; Nasopharyngeal(NP) swabs in vial transport medium  Result Value Ref Range Status   SARS Coronavirus 2 by RT PCR NEGATIVE NEGATIVE Final    Comment: (NOTE) SARS-CoV-2 target nucleic acids are NOT DETECTED.  The SARS-CoV-2 RNA is generally detectable in upper respiratory specimens during the acute phase of infection. The lowest concentration of SARS-CoV-2 viral copies this assay can detect is 138 copies/mL. A negative result does not preclude SARS-Cov-2 infection and should not be used as the sole basis for treatment or other patient management decisions. A negative result may occur with  improper specimen collection/handling, submission of specimen other than nasopharyngeal swab, presence of viral mutation(s) within the areas targeted by this assay, and inadequate number of viral copies(<138 copies/mL). A negative result must be combined  with clinical observations, patient history, and epidemiological information. The expected result is Negative.  Fact Sheet for Patients:  EntrepreneurPulse.com.au  Fact Sheet for Healthcare Providers:  IncredibleEmployment.be  This test is no t yet approved or cleared by the [place] FDA and  has been authorized for detection and/or diagnosis of SARS-CoV-2 by FDA under an Emergency Use Authorization (EUA). This EUA will remain  in effect (meaning this test can be used) for the duration of the COVID-19 declaration under Section 564(b)(1) of the Act, 21 U.S.C.section 360bbb-3(b)(1), unless the authorization is terminated  or revoked sooner.       Influenza A by PCR NEGATIVE NEGATIVE Final   Influenza B by PCR NEGATIVE NEGATIVE Final    Comment: (  NOTE) The Xpert Xpress SARS-CoV-2/FLU/RSV plus assay is intended as an aid in the diagnosis of influenza from Nasopharyngeal swab specimens and should not be used as a sole basis for treatment. Nasal washings and aspirates are unacceptable for Xpert Xpress SARS-CoV-2/FLU/RSV testing.  Fact Sheet for Patients: EntrepreneurPulse.com.au  Fact Sheet for Healthcare Providers: IncredibleEmployment.be  This test is not yet approved or cleared by the [place] FDA and has been authorized for detection and/or diagnosis of SARS-CoV-2 by FDA under an Emergency Use Authorization (EUA). This EUA will remain in effect (meaning this test can be used) for the duration of the COVID-19 declaration under Section 564(b)(1) of the Act, 21 U.S.C. section 360bbb-3(b)(1), unless the authorization is terminated or revoked.  Performed at [facility], [place] [address] [place], [place] [address]   MRSA Next Gen by PCR, Nasal     Status: None   Collection Time: 02/22/21  4:59 PM   Specimen: Nasal Mucosa; Nasal Swab  Result Value Ref Range Status   MRSA by PCR Next Gen NOT  DETECTED NOT DETECTED Final    Comment: (NOTE) The GeneXpert MRSA Assay (FDA approved for NASAL specimens only), is one component of a comprehensive MRSA colonization surveillance program. It is not intended to diagnose MRSA infection nor to guide or monitor treatment for MRSA infections. Test performance is not FDA approved in patients less than 15 years old. Performed at [facility], [place] [address], [place], [place] [address]     Alcide Evener, [place] [facility] [place] [facility] [phone] pager  02/23/2021, 2:07 PM

## 2021-02-23 NOTE — Progress Notes (Signed)
Pharmacy Antibiotic Note  [name] [name] is a 87 y.o. female admitted on 02/20/2021 presenting s/p fall. Concern for sepsis.  Pharmacy was consulted  on 02/20/21 for vancomycin and cefepime dosing for cellultis/ osteomyelitis.  AKI resolved,.  Scr down to 0.68, CrCl =31.4 ml/min  11/22 Blood Cx: ngtd x 2 days  cefepime 11/24 MRSA PCR:  negative  Goal AUC: 400-550 for vancomycin.   Plan:  Increase Cefepime to  2g q12h  Continue Vancomycin 750 mg IV q 48h (eAUC ~445, SCr used 1 due to her age) Monitor renal function, Cx and clinical progression to narrow Vancomycin levels as needed  Height: 5\' 1"  (154.9 cm) Weight: 46.3 kg (102 lb 1.2 oz) IBW/kg (Calculated) : 47.8  Temp (24hrs), Avg:97.5 F (36.4 C), Min:97.3 F (36.3 C), Max:97.5 F (36.4 C)  Recent Labs  Lab 02/20/21 0947 02/20/21 1021 02/20/21 1025 02/20/21 1157 02/21/21 0324 02/22/21 0225 02/23/21 0346  WBC 15.9*  --   --   --  8.2 8.8 7.1  CREATININE 1.20* 0.90  --   --  1.12* 0.89 0.68  LATICACIDVEN  --   --  4.6* 3.4*  --   --   --      Estimated Creatinine Clearance: 31.4 mL/min (by C-G formula based on SCr of 0.68 mg/dL).    No Known Allergies  Thank you for allowing pharmacy to participate in this patient's care. 02/25/21, RPh Clinical Pharmacist [phone] 02/23/2021,11:20 AM Please check AMION for all [facility] Pharmacy phone numbers After 10:00 PM, call Main Pharmacy [phone]

## 2021-02-23 NOTE — Plan of Care (Signed)
  Problem: Education: Goal: Knowledge of General Education information will improve Description Including pain rating scale, medication(s)/side effects and non-pharmacologic comfort measures Outcome: Progressing   

## 2021-02-23 NOTE — Significant Event (Addendum)
RN informed this MD that patient had a watery black stool. Hemoccult now pending.  Given her drop in Hgb from admission, although some may be correction of hemoconcentration, it is reasonable to hold anticoagulation for now and reassess timing of re-initiation.  Marylene Land, MD MPH Hospitalist

## 2021-02-24 DIAGNOSIS — R748 Abnormal levels of other serum enzymes: Secondary | ICD-10-CM

## 2021-02-24 DIAGNOSIS — R7989 Other specified abnormal findings of blood chemistry: Secondary | ICD-10-CM | POA: Diagnosis not present

## 2021-02-24 DIAGNOSIS — A419 Sepsis, unspecified organism: Secondary | ICD-10-CM | POA: Diagnosis not present

## 2021-02-24 DIAGNOSIS — G9340 Encephalopathy, unspecified: Secondary | ICD-10-CM | POA: Diagnosis not present

## 2021-02-24 DIAGNOSIS — T827XXA Infection and inflammatory reaction due to other cardiac and vascular devices, implants and grafts, initial encounter: Secondary | ICD-10-CM | POA: Diagnosis present

## 2021-02-24 DIAGNOSIS — R6520 Severe sepsis without septic shock: Secondary | ICD-10-CM | POA: Diagnosis not present

## 2021-02-24 DIAGNOSIS — Z7189 Other specified counseling: Secondary | ICD-10-CM

## 2021-02-24 DIAGNOSIS — R209 Unspecified disturbances of skin sensation: Secondary | ICD-10-CM | POA: Diagnosis not present

## 2021-02-24 LAB — CBC
HCT: 29.6 % — ABNORMAL LOW (ref 36.0–46.0)
HCT: 32.7 % — ABNORMAL LOW (ref 36.0–46.0)
Hemoglobin: 10 g/dL — ABNORMAL LOW (ref 12.0–15.0)
Hemoglobin: 11.2 g/dL — ABNORMAL LOW (ref 12.0–15.0)
MCH: 32.9 pg (ref 26.0–34.0)
MCH: 34 pg (ref 26.0–34.0)
MCHC: 33.8 g/dL (ref 30.0–36.0)
MCHC: 34.3 g/dL (ref 30.0–36.0)
MCV: 97.4 fL (ref 80.0–100.0)
MCV: 99.4 fL (ref 80.0–100.0)
Platelets: 60 10*3/uL — ABNORMAL LOW (ref 150–400)
Platelets: 64 10*3/uL — ABNORMAL LOW (ref 150–400)
RBC: 3.04 MIL/uL — ABNORMAL LOW (ref 3.87–5.11)
RBC: 3.29 MIL/uL — ABNORMAL LOW (ref 3.87–5.11)
RDW: 17.8 % — ABNORMAL HIGH (ref 11.5–15.5)
RDW: 18.2 % — ABNORMAL HIGH (ref 11.5–15.5)
WBC: 5.7 10*3/uL (ref 4.0–10.5)
WBC: 7.7 10*3/uL (ref 4.0–10.5)
nRBC: 0 % (ref 0.0–0.2)
nRBC: 0 % (ref 0.0–0.2)

## 2021-02-24 LAB — HEPATIC FUNCTION PANEL
ALT: 32 U/L (ref 0–44)
AST: 38 U/L (ref 15–41)
Albumin: 2.7 g/dL — ABNORMAL LOW (ref 3.5–5.0)
Alkaline Phosphatase: 68 U/L (ref 38–126)
Bilirubin, Direct: 0.3 mg/dL — ABNORMAL HIGH (ref 0.0–0.2)
Indirect Bilirubin: 1 mg/dL — ABNORMAL HIGH (ref 0.3–0.9)
Total Bilirubin: 1.3 mg/dL — ABNORMAL HIGH (ref 0.3–1.2)
Total Protein: 4.5 g/dL — ABNORMAL LOW (ref 6.5–8.1)

## 2021-02-24 LAB — BASIC METABOLIC PANEL
Anion gap: 6 (ref 5–15)
BUN: 48 mg/dL — ABNORMAL HIGH (ref 8–23)
CO2: 23 mmol/L (ref 22–32)
Calcium: 9.1 mg/dL (ref 8.9–10.3)
Chloride: 109 mmol/L (ref 98–111)
Creatinine, Ser: 0.57 mg/dL (ref 0.44–1.00)
GFR, Estimated: >60 mL/min (ref >60)
Glucose, Bld: 95 mg/dL (ref 70–99)
Potassium: 3.8 mmol/L (ref 3.5–5.1)
Sodium: 138 mmol/L (ref 135–145)

## 2021-02-24 LAB — HEPARIN INDUCED PLATELET AB (HIT ANTIBODY): Heparin Induced Plt Ab: 0.085 OD (ref 0.000–0.400)

## 2021-02-24 NOTE — TOC Progression Note (Signed)
Transition of Care [facility]) - Progression Note    Patient Details  Name: Adileny Delon Sampsel MRN: [id number] Date of Birth: 02-15-1925  Transition of Care [facility]) CM/SW [place], LCSW Phone Number:[phone] 02/24/2021, 1:22 PM  Clinical Narrative:     CSW met with pt's son and daughter in regards to their desire to have pt go to a SNF. CSW educated them that the current recommendation was for [facility].CSW also discussed some of the limitations with [facility] and that they are contracted with fewer facilities. CSW informed them that it was recommended that CSW reach out to PT/OT and see if they thought a SNF was medically necessary and would the recommendation change.  Family also want to know what type of [place] services would be available to pt . CSW reached out to RN CM.  Family also asked about ALF  as well.  CSW encouraged family to let RN CM know if they still wanted to pursue a SNF that CSW would reach out to PT/OT for them to reevaluate pt.  TOC team will continue to assist with discharge planning needs.         Expected Discharge Plan and Services                                                 Social Determinants of Health (SDOH) Interventions    Readmission Risk Interventions No flowsheet data found.

## 2021-02-24 NOTE — Progress Notes (Signed)
Subjective:  Patient without any complaints but she does not participate much in the conversation   Antibiotics:  Anti-infectives (From admission, onward)    Start     Dose/Rate Route Frequency Ordered Stop   02/23/21 1215  ceFEPIme (MAXIPIME) 2 g in sodium chloride 0.9 % 100 mL IVPB        2 g 200 mL/hr over 30 Minutes Intravenous Every 12 hours 02/23/21 1128     02/22/21 2200  ceFEPIme (MAXIPIME) 2 g in sodium chloride 0.9 % 100 mL IVPB  Status:  Discontinued        2 g 200 mL/hr over 30 Minutes Intravenous Every 12 hours 02/22/21 1158 02/22/21 1159   02/22/21 1300  vancomycin (VANCOREADY) IVPB 750 mg/150 mL  Status:  Discontinued        750 mg 150 mL/hr over 60 Minutes Intravenous Every 48 hours 02/20/21 1233 02/23/21 1602   02/22/21 1245  ceFEPIme (MAXIPIME) 2 g in sodium chloride 0.9 % 100 mL IVPB  Status:  Discontinued        2 g 200 mL/hr over 30 Minutes Intravenous Every 24 hours 02/22/21 1159 02/23/21 1128   02/22/21 0900  ceFEPIme (MAXIPIME) 2 g in sodium chloride 0.9 % 100 mL IVPB  Status:  Discontinued        2 g 200 mL/hr over 30 Minutes Intravenous Every 24 hours 02/21/21 1757 02/22/21 1158   02/20/21 2000  ceFEPIme (MAXIPIME) 2 g in sodium chloride 0.9 % 100 mL IVPB  Status:  Discontinued        2 g 200 mL/hr over 30 Minutes Intravenous Every 12 hours 02/20/21 1336 02/21/21 1757   02/20/21 1200  piperacillin-tazobactam (ZOSYN) IVPB 3.375 g        3.375 g 100 mL/hr over 30 Minutes Intravenous  Once 02/20/21 1154 02/20/21 1342   02/20/21 1200  vancomycin (VANCOREADY) IVPB 1000 mg/200 mL        1,000 mg 200 mL/hr over 60 Minutes Intravenous  Once 02/20/21 1154 02/20/21 1358       Medications: Scheduled Meds:  empagliflozin  10 mg Oral Daily   feeding supplement  237 mL Oral BID BM   levothyroxine  75 mcg Oral QAC breakfast   memantine  5 mg Oral QHS   multivitamin with minerals  1 tablet Oral Daily   nutrition supplement (JUVEN)  1 packet Oral BID  BM   silver sulfADIAZINE   Topical Daily   Continuous Infusions:  sodium chloride Stopped (02/21/21 0947)   ceFEPime (MAXIPIME) IV 2 g (02/24/21 1246)   PRN Meds:.sodium chloride, acetaminophen **OR** acetaminophen, HYDROmorphone (DILAUDID) injection, ondansetron **OR** ondansetron (ZOFRAN) IV    Objective: Weight change: -2.1 kg  Intake/Output Summary (Last 24 hours) at 02/24/2021 1522 Last data filed at 02/24/2021 1413 Gross per 24 hour  Intake 860 ml  Output 1050 ml  Net -190 ml   Blood pressure (!) 91/54, pulse 62, temperature 97.6 F (36.4 C), temperature source Oral, resp. rate 18, height 5\' 1"  (1.549 m), weight 44.2 kg, SpO2 98 %. Temp:  [97.6 F (36.4 C)-97.8 F (36.6 C)] 97.6 F (36.4 C) (11/26 1123) Pulse Rate:  [59-62] 62 (11/26 1123) Resp:  [17-18] 18 (11/26 1123) BP: (84-111)/(53-69) 91/54 (11/26 1123) SpO2:  [97 %-98 %] 98 % (11/26 1123) Weight:  [44.2 kg] 44.2 kg (11/26 0454)  Physical Exam: Physical Exam HENT:     Head:     Comments: There is an area  where she has blood where she scratched herself on her nose Cardiovascular:     Rate and Rhythm: Rhythm irregular.  Pulmonary:     Effort: Pulmonary effort is normal. No respiratory distress.     Breath sounds: No wheezing.  Abdominal:     General: There is no distension.  Neurological:     General: No focal deficit present.     Mental Status: She is alert. She is disoriented.  Psychiatric:        Cognition and Memory: Cognition is impaired. Memory is impaired. She exhibits impaired recent memory and impaired remote memory.    Left lower extremity bandaged  CBC:    BMET Recent Labs    02/23/21 0346 02/24/21 0329  NA 138 138  K 4.0 3.8  CL 110 109  CO2 22 23  GLUCOSE 92 95  BUN 45* 48*  CREATININE 0.68 0.57  CALCIUM 9.3 9.1     Liver Panel  Recent Labs    02/23/21 0346 02/24/21 0329  PROT 4.8* 4.5*  ALBUMIN 2.9* 2.7*  AST 40 38  ALT 33 32  ALKPHOS 63 68  BILITOT 1.3* 1.3*   BILIDIR 0.3* 0.3*  IBILI 1.0* 1.0*       Sedimentation Rate No results for input(s): ESRSEDRATE in the last 72 hours. C-Reactive Protein No results for input(s): CRP in the last 72 hours.  Micro Results: Recent Results (from the past 720 hour(s))  Body fluid culture w Gram Stain     Status: None   Collection Time: 02/06/21  7:05 PM   Specimen: Foot; Body Fluid  Result Value Ref Range Status   Specimen Description   Final    FOOT Performed at [facility], [address] [place], [place] [address]    Special Requests   Final    NONE Performed at [facility], [address] [place], [place] [address]    Gram Stain   Final    ABUNDANT WBC PRESENT, PREDOMINANTLY PMN NO ORGANISMS SEEN Performed at [facility], [address] [place], [place] [address]    Culture   Final    RARE LECLERCIA ADECARBOXYLATA RARE PSEUDOMONAS PUTIDA    Report Status 02/10/2021 FINAL  Final   Organism ID, Bacteria LECLERCIA ADECARBOXYLATA  Final   Organism ID, Bacteria PSEUDOMONAS PUTIDA  Final      Susceptibility   Leclercia adecarboxylata - MIC*    AMPICILLIN <=2 SENSITIVE Sensitive     CEFAZOLIN <=4 SENSITIVE Sensitive     CEFEPIME <=0.12 SENSITIVE Sensitive     CEFTAZIDIME <=1 SENSITIVE Sensitive     CEFTRIAXONE <=0.25 SENSITIVE Sensitive     CIPROFLOXACIN <=0.25 SENSITIVE Sensitive     GENTAMICIN <=1 SENSITIVE Sensitive     IMIPENEM <=0.25 SENSITIVE Sensitive     TRIMETH/SULFA <=20 SENSITIVE Sensitive     AMPICILLIN/SULBACTAM <=2 SENSITIVE Sensitive     PIP/TAZO <=4 SENSITIVE Sensitive     * RARE LECLERCIA ADECARBOXYLATA   Pseudomonas putida - MIC*    CEFTAZIDIME 2 SENSITIVE Sensitive     CIPROFLOXACIN <=0.25 SENSITIVE Sensitive     GENTAMICIN <=1 SENSITIVE Sensitive     IMIPENEM 2 SENSITIVE Sensitive     PIP/TAZO 8 SENSITIVE Sensitive     * RARE PSEUDOMONAS PUTIDA  Gram stain     Status: None   Collection Time: 02/06/21  7:05 PM   Specimen: Foot; Abscess  Result Value  Ref Range Status   Specimen Description FOOT  Final   Special Requests  NONE  Final   Gram Stain   Final    WBC PRESENT,BOTH PMN AND MONONUCLEAR NO ORGANISMS SEEN CYTOSPIN SMEAR Performed at [facility], [address] [place], [place] [address]    Report Status 02/06/2021 FINAL  Final  Anaerobic and Aerobic Culture     Status: Abnormal   Collection Time: 02/14/21  4:46 PM   Specimen: Foot; Other   FO  Result Value Ref Range Status   Anaerobic Culture Final report  Final   Result 1 Comment  Final    Comment: No anaerobic growth in 72 hours.   Aerobic Culture Final report (A)  Final   Result 1 Pseudomonas putida (A)  Final    Comment: Heavy growth   Result 2 Mixed skin flora  Final    Comment: Light growth   Antimicrobial Susceptibility Comment  Final    Comment:       ** S = Susceptible; I = Intermediate; R = Resistant **                    P = Positive; N = Negative             MICS are expressed in micrograms per mL    Antibiotic                 RSLT#1    RSLT#2    RSLT#3    RSLT#4 Amikacin                       S Aztreonam                      I Cefepime                       S Cefotaxime                     S Ceftazidime                    S Ceftriaxone                    S Ciprofloxacin                  S Gentamicin                     S Imipenem                       S Levofloxacin                   S Meropenem                      S Piperacillin/Tazobactam        S Ticarcillin/Clavulanate        I Tobramycin                     S Trimethoprim/Sulfa             R   Wound culture     Status: Abnormal   Collection Time: 02/15/21  3:45 PM   Specimen: Wound  Result Value Ref Range Status   MICRO NUMBER: [id number]  Final   SPECIMEN QUALITY: Adequate  Final   SOURCE: FOOT, LEFT  Final   STATUS: FINAL  Final   Lonell Grandchild  STAIN:   Final    Few White blood cells seen No epithelial cells seen Moderate Gram negative bacilli   ISOLATE 1: Pseudomonas putida (A)  Final     Comment: Heavy growth of Pseudomonas putida      Susceptibility   Pseudomonas putida - AEROBIC CULT, GRAM STAIN NEGATIVE 1    CEFTAZIDIME 2 Sensitive     CEFEPIME <=1 Sensitive     CIPROFLOXACIN <=0.25 Sensitive     LEVOFLOXACIN 1 Sensitive     GENTAMICIN <=1 Sensitive     PIP/TAZO 8 Sensitive     TOBRAMYCIN* <=1 Sensitive      * Legend: S = Susceptible  I = Intermediate R = Resistant  NS = Not susceptible * = Not tested  NR = Not reported **NN = See antimicrobic comments   Culture, blood (Routine x 2)     Status: None (Preliminary result)   Collection Time: 02/20/21 10:25 AM   Specimen: BLOOD  Result Value Ref Range Status   Specimen Description BLOOD LEFT ANTECUBITAL  Final   Special Requests   Final    BOTTLES DRAWN AEROBIC AND ANAEROBIC Blood Culture adequate volume   Culture   Final    NO GROWTH 4 DAYS Performed at [facility], [place] [address], [place], [place] [address]    Report Status PENDING  Incomplete  Culture, blood (Routine x 2)     Status: None (Preliminary result)   Collection Time: 02/20/21 10:36 AM   Specimen: BLOOD  Result Value Ref Range Status   Specimen Description BLOOD RIGHT ANTECUBITAL  Final   Special Requests   Final    BOTTLES DRAWN AEROBIC AND ANAEROBIC Blood Culture results may not be optimal due to an inadequate volume of blood received in culture bottles   Culture   Final    NO GROWTH 4 DAYS Performed at [facility], [place] [address] [place], [place] [address]    Report Status PENDING  Incomplete  Resp Panel by RT-PCR (Flu A&B, Covid) Nasopharyngeal Swab     Status: None   Collection Time: 02/20/21  3:34 PM   Specimen: Nasopharyngeal Swab; Nasopharyngeal(NP) swabs in vial transport medium  Result Value Ref Range Status   SARS Coronavirus 2 by RT PCR NEGATIVE NEGATIVE Final    Comment: (NOTE) SARS-CoV-2 target nucleic acids are NOT DETECTED.  The SARS-CoV-2 RNA is generally detectable in upper respiratory specimens during the  acute phase of infection. The lowest concentration of SARS-CoV-2 viral copies this assay can detect is 138 copies/mL. A negative result does not preclude SARS-Cov-2 infection and should not be used as the sole basis for treatment or other patient management decisions. A negative result may occur with  improper specimen collection/handling, submission of specimen other than nasopharyngeal swab, presence of viral mutation(s) within the areas targeted by this assay, and inadequate number of viral copies(<138 copies/mL). A negative result must be combined with clinical observations, patient history, and epidemiological information. The expected result is Negative.  Fact Sheet for Patients:  EntrepreneurPulse.com.au  Fact Sheet for Healthcare Providers:  IncredibleEmployment.be  This test is no t yet approved or cleared by the [place] FDA and  has been authorized for detection and/or diagnosis of SARS-CoV-2 by FDA under an Emergency Use Authorization (EUA). This EUA will remain  in effect (meaning this test can be used) for the duration of the COVID-19 declaration under Section 564(b)(1) of the Act, 21 U.S.C.section 360bbb-3(b)(1), unless the authorization is terminated  or  revoked sooner.       Influenza A by PCR NEGATIVE NEGATIVE Final   Influenza B by PCR NEGATIVE NEGATIVE Final    Comment: (NOTE) The Xpert Xpress SARS-CoV-2/FLU/RSV plus assay is intended as an aid in the diagnosis of influenza from Nasopharyngeal swab specimens and should not be used as a sole basis for treatment. Nasal washings and aspirates are unacceptable for Xpert Xpress SARS-CoV-2/FLU/RSV testing.  Fact Sheet for Patients: BloggerCourse.com  Fact Sheet for Healthcare Providers: SeriousBroker.it  This test is not yet approved or cleared by the [place] FDA and has been authorized for detection and/or  diagnosis of SARS-CoV-2 by FDA under an Emergency Use Authorization (EUA). This EUA will remain in effect (meaning this test can be used) for the duration of the COVID-19 declaration under Section 564(b)(1) of the Act, 21 U.S.C. section 360bbb-3(b)(1), unless the authorization is terminated or revoked.  Performed at [facility], [address] [place], [place] [address]   MRSA Next Gen by PCR, Nasal     Status: None   Collection Time: 02/22/21  4:59 PM   Specimen: Nasal Mucosa; Nasal Swab  Result Value Ref Range Status   MRSA by PCR Next Gen NOT DETECTED NOT DETECTED Final    Comment: (NOTE) The GeneXpert MRSA Assay (FDA approved for NASAL specimens only), is one component of a comprehensive MRSA colonization surveillance program. It is not intended to diagnose MRSA infection nor to guide or monitor treatment for MRSA infections. Test performance is not FDA approved in patients less than 79 years old. Performed at [facility], [address], [place], [place] [address]     Studies/Results: No results found.    Assessment/Plan:  INTERVAL HISTORY: Patient seen by Dr. Ladona Ridgel with electrophysiology who does not think the patient should have a TEE or device extraction   Principal Problem:   Sepsis (HCC) Active Problems:   Cold extremities   Recurrent falls   Ulcer of left foot with fat layer exposed (HCC)   Protein-calorie malnutrition, severe   Endocarditis, valve unspecified    [name] [name] is a 87 y.o. female with advanced dementia with complete heart block and pacemaker who had stepped on a yellowjacket nest treated with prednisone Zyrtec and then with worsening of her wound which now encompasses the entire of her dorsal foot with exposed tendon.  She was admitted with severe sepsis responded to IV antibiotics and fluids.  Her blood cultures have not yielded any organism.  She was on broad-spectrum antibiotics with vancomycin and Zosyn and then  vancomycin and cefepime with last dose of vancomycin on the 24th.  She grew a pseudomonal species from her wound from specimen taken in clinic.  Her 2D echocardiogram suggest a vegetation on her pacemaker.  I agree with cardiology electrophysiology that performing a TEE is not appropriate given her frailty.  Device extraction also is not a possibility.  #1 Pacemaker infection:  Fortunately the patient is not bacteremic  Unfortunately given that she is not bacteremic it is not possible for me to know what organism has infected her pacemaker.  Certainly it COULD be the pseudomonas but I do not know  IF we target the pseudomonas I would not want to send her home with IV therapy.  Oral therapy is a possibility with either high-dose levofloxacin or oral ciprofloxacin.  Obviously flora quinolone therapy in an elderly person is not without its own risk, namely worsening encephalopathy and C. difficile colitis.  Willing to  give the patient 6 weeks of therapy with a flora quinolone if family can accept the risk of C. difficile colitis.  I personally would favor pivot to palliative care rather than continued aggressive treatment.  #2 severe foot wound with exposed tendon: Again clearly she had a severe infection that caused sepsis and we would presume that the foot was the source.  The question is how long to treat she would treat for limited course of time now that she is stabilized from her sepsis or she would give her more protracted therapy  I have emphasized that the son and daughter that even giving protracted antibiotics is not a guarantee of preventing her from being readmitted with sepsis given the extent of her wound.  #3 Goals of care: Son daughter would be interested it sounds to me in patient being placed in inpatient hospice.  They believe that she was not a candidate for this but I do not see documentation that this has been discussed.  Patient clearly needs to be seen by  palliative care.  I will also come back and see the patient again at 11 AM tomorrow  I spent more than 57minutes with the patient including  face to face counseling of the patient and her surrogate her son and daughter regarding her wound infection with Pseudomonas-isolated her pacemaker infection her sepsis her dementia her goals of care personally her imaging her transthoracic echocardiogram her updated blood cultures wound cultures CBC CMP along with review of medical records in preparation for the visit and during the visit and in coordination of her care.   LOS: 4 days   Alcide Evener 02/24/2021, 3:22 PM

## 2021-02-24 NOTE — TOC CM/SW Note (Addendum)
Made aware by LCSW that patient's family wanted to discuss home health options. Went to bedside. Family not present. Telephone call to daughter/DPR Lupita Leash. No answer. Did not leave voicemail. Telephone call made to son/DPR  Dorinda Hill who states they were trying to gather all the information regarding home health vs SNF. States he and his sister will be back to the hospital tomorrow around 11 am.   Addendum 1713pm: Telephone call received from patient's daughter. Writer explained what home health services would entail.    Raiford Noble, MSN, RN,BSN Inpatient [facility] Manager [phone]

## 2021-02-24 NOTE — Progress Notes (Signed)
PROGRESS NOTE  [name] [name] [id number] DOB: 1924-10-20 DOA: 02/20/2021 PCP: Janora Norlander, DO  HPI/Recap of past 24 hours: [name] [name] is a 87 y.o. female with medical history significant of left foot nonhealing wound x2 weeks, PAF on Eliquis, SSS status post PPM, HTN, HLD, dementia (lives alone), presented with fall at home.  Daughter reported that patient sustained an open wound of left dorsal foot about 2 weeks ago, for which patient completed 7 days of Augmentin and had outpatient debridement 5 days prior to presentation with Dr. Sharol Given.  Was taking doxycycline for last 4 days prior to presentation.  Found on the floor and complained about left hip pain and unable to get up herself.  In the ED found to be fluid overload, chest x-ray showed bilateral pleural effusion, 2+ pitting edema in lower extremity bilaterally, daughter reported the patient's legs "are always this big and puffy".  Patient was started on IV vancomycin and Zosyn empirically.  SUBJECTIVE: 02/24/2021: Patient was seen and examined at her bedside.  She is alert and pleasantly confused.  She has no new complaints.  Assessment/Plan: Principal Problem:   Sepsis ([place]) Active Problems:   Cold extremities   Recurrent falls   Ulcer of left foot with fat layer exposed ([place])   Protein-calorie malnutrition, severe   Endocarditis, valve unspecified  SSTI Sepsis, resolved Evidenced by leukocytosis, elevated lactate, signs of endorgan damage of acute metabolic encephalopathy/confusion and AKI, source is left foot deep wound infection - Outpatient superficial culture shows Pseudomonas on 02/15/21 - Blood cx NGTD - Continue cefepime, vanc dcd per ID - ID consulting; appreciate recs - Per Ortho, no plans for surgery; f/u outpatient, wound care Continue local wound care.  Acute blood loss anemia with positive FOBT with concern for upper GI bleed. Drop in hemoglobin this morning 10.0 from 12.7 Dark stools reported on  02/23/2021, heparin drip was stopped. Start p.o. Protonix 40 mg twice daily. Repeat CBC this afternoon. Maintain hemoglobin greater than 8.0 due to underlying cardiac condition.  Thrombocytopenia, worsening Platelet count downtrending 60 from 80 on 02/23/2021. Off anticoagulation   New/acute systolic CHF Daughter reported patient at baseline active, no complaint of chest pain or shortness of breath with moderate activity. Could consider stress cardiomyopathy given acute illness. - TTE on 02/21/2021 showed LVEF 25 to 30%.   - can trial jardiance 10mg  given less BP effect and more diuresis; if not affordable or moving toward hospice, reasonable to stop  Bilateral lower extremity edema, improving Has been going on for some time, likely impairing wound healing. Potentially related to heart failure vs venous insufficiency; liikely multifactorial - consider hose on non injured foot - diuretic prn  Abnormal Echocardiogram  Right atrium thrombus vs vegetation TTE w/small mobile echodensity adherent to pacemaker lead. - resume apixaban - EP consulted; would favor treating empirically rather than pursuing TEE and lead extraction given age, advanced dementia and risks associated w/procedure   Prerenal AKI, resolved prerenal  Likely secondary to dehydration from poor oral intake. - strict I/O - Continue to avoid nephrotoxic agents, dehydration and hypotension. - liberalize diet and encourage   Abnormal LFTS - No RUQ tenderness, likely secondary to decompensation of, and or mild rhabdomyolysis - Hydration with albumin prn  Abnormal right toe brachial index Per ultrasound report, although ankle-brachial indices are within normal limits, arterial Doppler waveforms at the ankle suggest some component of arterial occlusive disease  Prolonged QTC QTC 551 on 02/24/2021. Avoid QTC prolonging agents Optimize magnesium and  potassium levels.  Left hip pain, POA - Pelvic x-ray non-conclusive. No  fracture seen on CT scan. - pain meds prn, cool compress   A. fib, paroxysmal Rate controlled. - c/w apixaban 2.5mg  BID; no need for bridging heparin    HTN BP is at goal - Continue to hold off home HCTZ; resume as able   Mild rhabdomyolysis Likely related to Poor PO intake and AKI - CK downdrended - S/p hydration; no further IV fluid   Severe protein calorie malnutrition - Dietitian following - Encourage oral protein calorie intake - liberalize diet  Generalized weakness She was assessed by PT OT with recommendation for home health PT OT. Continue fall precautions   Goals of care Discussed with patient's family regarding aggressive vs more palliative measures. Recommended DNR, patient's family agreeable and are looking into directives.    DVT prophylaxis: Heparin drip discontinued on 02/23/2021 due to concern for upper GI bleed. Code Status: Full code Family Communication: Son and daughter-in-law at bedside. Disposition Plan: Expect more than 2 midnight hospital stay Consults called: Orthopedic surgery, cardiology/EP, infectious disease. Admission status: Tele admit     Objective: Vitals:   02/23/21 2001 02/23/21 2007 02/24/21 0454 02/24/21 1123  BP: (!) 84/69 (!) 111/55 (!) 102/53 (!) 91/54  Pulse: (!) 59  (!) 59 62  Resp:   17 18  Temp: 97.6 F (36.4 C)  97.8 F (36.6 C) 97.6 F (36.4 C)  TempSrc: Oral  Oral Oral  SpO2: 98%  97% 98%  Weight:   44.2 kg   Height:        Intake/Output Summary (Last 24 hours) at 02/24/2021 1225 Last data filed at 02/24/2021 0859 Gross per 24 hour  Intake 920 ml  Output 800 ml  Net 120 ml   Filed Weights   02/22/21 0425 02/23/21 0224 02/24/21 0454  Weight: 44.5 kg 46.3 kg 44.2 kg    Exam:  General: 87 y.o. year-old female frail-appearing no acute distress.  She is alert and pleasantly confused. Cardiovascular: Irregular rate and rhythm no rubs or gallops.  No JVD thyromegaly noted.   Respiratory: Clear to  auscultation with no wheezes or rales. Abdomen: Soft nontender normal bowel sounds present. Musculoskeletal: 1+ pitting edema lower extremities bilaterally. Skin:  Psychiatry: Mood is appropriate for condition and setting. Neuro: She moves all 4 extremities equally.    Data Reviewed: CBC: Recent Labs  Lab 02/20/21 0947 02/20/21 1021 02/21/21 0324 02/22/21 0225 02/23/21 0346 02/23/21 1859 02/24/21 0329  WBC 15.9*  --  8.2 8.8 7.1 10.1 5.7  NEUTROABS 14.3*  --   --   --   --   --   --   HGB 17.1*   < > 13.1 14.1 11.9* 12.7 10.0*  HCT 50.9*   < > 37.7 40.5 35.1* 37.7 29.6*  MCV 100.8*  --  96.7 96.9 98.6 101.1* 97.4  PLT 106*  --  89* 78* 71* 80* 60*   < > = values in this interval not displayed.   Basic Metabolic Panel: Recent Labs  Lab 02/20/21 0947 02/20/21 1021 02/21/21 0324 02/22/21 0225 02/23/21 0346 02/24/21 0329  NA 136 134* 137 137 138 138  K 4.5 4.4 3.7 3.7 4.0 3.8  CL 104 108 107 109 110 109  CO2 13*  --  18* 19* 22 23  GLUCOSE 96 96 66* 104* 92 95  BUN 35* 38* 33* 46* 45* 48*  CREATININE 1.20* 0.90 1.12* 0.89 0.68 0.57  CALCIUM 10.0  --  9.6 9.8 9.3 9.1   GFR: Estimated Creatinine Clearance: 30 mL/min (by C-G formula based on SCr of 0.57 mg/dL). Liver Function Tests: Recent Labs  Lab 02/20/21 0947 02/21/21 0324 02/22/21 0225 02/23/21 0346 02/24/21 0329  AST 88* 70* 55* 40 38  ALT 58* 39 36 33 32  ALKPHOS 101 72 73 63 68  BILITOT 1.7* 1.9* 1.2 1.3* 1.3*  PROT 6.3* 5.8* 5.3* 4.8* 4.5*  ALBUMIN 3.3* 3.9 3.4* 2.9* 2.7*   No results for input(s): LIPASE, AMYLASE in the last 168 hours. No results for input(s): AMMONIA in the last 168 hours. Coagulation Profile: Recent Labs  Lab 02/20/21 1147 02/20/21 1229  INR 1.4* 1.4*   Cardiac Enzymes: Recent Labs  Lab 02/20/21 0947 02/21/21 0324 02/23/21 0912  CKTOTAL 977* 481* 65   BNP (last 3 results) No results for input(s): PROBNP in the last 8760 hours. HbA1C: No results for input(s):  HGBA1C in the last 72 hours. CBG: No results for input(s): GLUCAP in the last 168 hours. Lipid Profile: No results for input(s): CHOL, HDL, LDLCALC, TRIG, CHOLHDL, LDLDIRECT in the last 72 hours. Thyroid Function Tests: No results for input(s): TSH, T4TOTAL, FREET4, T3FREE, THYROIDAB in the last 72 hours.  Anemia Panel: No results for input(s): VITAMINB12, FOLATE, FERRITIN, TIBC, IRON, RETICCTPCT in the last 72 hours. Urine analysis: No results found for: COLORURINE, APPEARANCEUR, LABSPEC, PHURINE, GLUCOSEU, HGBUR, BILIRUBINUR, KETONESUR, PROTEINUR, UROBILINOGEN, NITRITE, LEUKOCYTESUR Sepsis Labs: @LABRCNTIP (procalcitonin:4,lacticidven:4)  ) Recent Results (from the past 240 hour(s))  Anaerobic and Aerobic Culture     Status: Abnormal   Collection Time: 02/14/21  4:46 PM   Specimen: Foot; Other   FO  Result Value Ref Range Status   Anaerobic Culture Final report  Final   Result 1 Comment  Final    Comment: No anaerobic growth in 72 hours.   Aerobic Culture Final report (A)  Final   Result 1 Pseudomonas putida (A)  Final    Comment: Heavy growth   Result 2 Mixed skin flora  Final    Comment: Light growth   Antimicrobial Susceptibility Comment  Final    Comment:       ** S = Susceptible; I = Intermediate; R = Resistant **                    P = Positive; N = Negative             MICS are expressed in micrograms per mL    Antibiotic                 RSLT#1    RSLT#2    RSLT#3    RSLT#4 Amikacin                       S Aztreonam                      I Cefepime                       S Cefotaxime                     S Ceftazidime                    S Ceftriaxone                    S Ciprofloxacin  S Gentamicin                     S Imipenem                       S Levofloxacin                   S Meropenem                      S Piperacillin/Tazobactam        S Ticarcillin/Clavulanate        I Tobramycin                     S Trimethoprim/Sulfa              R   Wound culture     Status: Abnormal   Collection Time: 02/15/21  3:45 PM   Specimen: Wound  Result Value Ref Range Status   MICRO NUMBER: [id number]  Final   SPECIMEN QUALITY: Adequate  Final   SOURCE: FOOT, LEFT  Final   STATUS: FINAL  Final   GRAM STAIN:   Final    Few White blood cells seen No epithelial cells seen Moderate Gram negative bacilli   ISOLATE 1: Pseudomonas putida (A)  Final    Comment: Heavy growth of Pseudomonas putida      Susceptibility   Pseudomonas putida - AEROBIC CULT, GRAM STAIN NEGATIVE 1    CEFTAZIDIME 2 Sensitive     CEFEPIME <=1 Sensitive     CIPROFLOXACIN <=0.25 Sensitive     LEVOFLOXACIN 1 Sensitive     GENTAMICIN <=1 Sensitive     PIP/TAZO 8 Sensitive     TOBRAMYCIN* <=1 Sensitive      * Legend: S = Susceptible  I = Intermediate R = Resistant  NS = Not susceptible * = Not tested  NR = Not reported **NN = See antimicrobic comments   Culture, blood (Routine x 2)     Status: None (Preliminary result)   Collection Time: 02/20/21 10:25 AM   Specimen: BLOOD  Result Value Ref Range Status   Specimen Description BLOOD LEFT ANTECUBITAL  Final   Special Requests   Final    BOTTLES DRAWN AEROBIC AND ANAEROBIC Blood Culture adequate volume   Culture   Final    NO GROWTH 4 DAYS Performed at [facility], [address] [place], [place] [address]    Report Status PENDING  Incomplete  Culture, blood (Routine x 2)     Status: None (Preliminary result)   Collection Time: 02/20/21 10:36 AM   Specimen: BLOOD  Result Value Ref Range Status   Specimen Description BLOOD RIGHT ANTECUBITAL  Final   Special Requests   Final    BOTTLES DRAWN AEROBIC AND ANAEROBIC Blood Culture results may not be optimal due to an inadequate volume of blood received in culture bottles   Culture   Final    NO GROWTH 4 DAYS Performed at [facility], [address], [place], [place] [address]    Report Status PENDING  Incomplete  Resp Panel by RT-PCR (Flu A&B,  Covid) Nasopharyngeal Swab     Status: None   Collection Time: 02/20/21  3:34 PM   Specimen: Nasopharyngeal Swab; Nasopharyngeal(NP) swabs in vial transport medium  Result Value Ref Range Status   SARS Coronavirus 2 by RT PCR NEGATIVE NEGATIVE Final    Comment: (  NOTE) SARS-CoV-2 target nucleic acids are NOT DETECTED.  The SARS-CoV-2 RNA is generally detectable in upper respiratory specimens during the acute phase of infection. The lowest concentration of SARS-CoV-2 viral copies this assay can detect is 138 copies/mL. A negative result does not preclude SARS-Cov-2 infection and should not be used as the sole basis for treatment or other patient management decisions. A negative result may occur with  improper specimen collection/handling, submission of specimen other than nasopharyngeal swab, presence of viral mutation(s) within the areas targeted by this assay, and inadequate number of viral copies(<138 copies/mL). A negative result must be combined with clinical observations, patient history, and epidemiological information. The expected result is Negative.  Fact Sheet for Patients:  EntrepreneurPulse.com.au  Fact Sheet for Healthcare Providers:  IncredibleEmployment.be  This test is no t yet approved or cleared by the [place] FDA and  has been authorized for detection and/or diagnosis of SARS-CoV-2 by FDA under an Emergency Use Authorization (EUA). This EUA will remain  in effect (meaning this test can be used) for the duration of the COVID-19 declaration under Section 564(b)(1) of the Act, 21 U.S.C.section 360bbb-3(b)(1), unless the authorization is terminated  or revoked sooner.       Influenza A by PCR NEGATIVE NEGATIVE Final   Influenza B by PCR NEGATIVE NEGATIVE Final    Comment: (NOTE) The Xpert Xpress SARS-CoV-2/FLU/RSV plus assay is intended as an aid in the diagnosis of influenza from Nasopharyngeal swab specimens and should  not be used as a sole basis for treatment. Nasal washings and aspirates are unacceptable for Xpert Xpress SARS-CoV-2/FLU/RSV testing.  Fact Sheet for Patients: EntrepreneurPulse.com.au  Fact Sheet for Healthcare Providers: IncredibleEmployment.be  This test is not yet approved or cleared by the [place] FDA and has been authorized for detection and/or diagnosis of SARS-CoV-2 by FDA under an Emergency Use Authorization (EUA). This EUA will remain in effect (meaning this test can be used) for the duration of the COVID-19 declaration under Section 564(b)(1) of the Act, 21 U.S.C. section 360bbb-3(b)(1), unless the authorization is terminated or revoked.  Performed at [facility], [place] [address], [place], [place] [address]   MRSA Next Gen by PCR, Nasal     Status: None   Collection Time: 02/22/21  4:59 PM   Specimen: Nasal Mucosa; Nasal Swab  Result Value Ref Range Status   MRSA by PCR Next Gen NOT DETECTED NOT DETECTED Final    Comment: (NOTE) The GeneXpert MRSA Assay (FDA approved for NASAL specimens only), is one component of a comprehensive MRSA colonization surveillance program. It is not intended to diagnose MRSA infection nor to guide or monitor treatment for MRSA infections. Test performance is not FDA approved in patients less than 29 years old. Performed at [facility], [place] [address] [place], [place] [address]       Studies: No results found.  Scheduled Meds:  empagliflozin  10 mg Oral Daily   feeding supplement  237 mL Oral BID BM   levothyroxine  75 mcg Oral QAC breakfast   memantine  5 mg Oral QHS   multivitamin with minerals  1 tablet Oral Daily   nutrition supplement (JUVEN)  1 packet Oral BID BM   silver sulfADIAZINE   Topical Daily    Continuous Infusions:  sodium chloride Stopped (02/21/21 0947)   ceFEPime (MAXIPIME) IV 2 g (02/24/21 0055)     LOS: 4 days     Kayleen Memos, MD [facility] Pager [phone]  02/24/2021, 12:25 PM

## 2021-02-25 ENCOUNTER — Other Ambulatory Visit: Payer: Self-pay | Admitting: Family Medicine

## 2021-02-25 DIAGNOSIS — I330 Acute and subacute infective endocarditis: Secondary | ICD-10-CM | POA: Diagnosis not present

## 2021-02-25 DIAGNOSIS — Z711 Person with feared health complaint in whom no diagnosis is made: Secondary | ICD-10-CM

## 2021-02-25 DIAGNOSIS — A419 Sepsis, unspecified organism: Secondary | ICD-10-CM | POA: Diagnosis not present

## 2021-02-25 DIAGNOSIS — T827XXS Infection and inflammatory reaction due to other cardiac and vascular devices, implants and grafts, sequela: Secondary | ICD-10-CM

## 2021-02-25 DIAGNOSIS — E034 Atrophy of thyroid (acquired): Secondary | ICD-10-CM

## 2021-02-25 DIAGNOSIS — R748 Abnormal levels of other serum enzymes: Secondary | ICD-10-CM | POA: Diagnosis not present

## 2021-02-25 DIAGNOSIS — R638 Other symptoms and signs concerning food and fluid intake: Secondary | ICD-10-CM

## 2021-02-25 DIAGNOSIS — Z7189 Other specified counseling: Secondary | ICD-10-CM

## 2021-02-25 DIAGNOSIS — Z66 Do not resuscitate: Secondary | ICD-10-CM

## 2021-02-25 DIAGNOSIS — F02C0 Dementia in other diseases classified elsewhere, severe, without behavioral disturbance, psychotic disturbance, mood disturbance, and anxiety: Secondary | ICD-10-CM

## 2021-02-25 DIAGNOSIS — T827XXD Infection and inflammatory reaction due to other cardiac and vascular devices, implants and grafts, subsequent encounter: Secondary | ICD-10-CM

## 2021-02-25 DIAGNOSIS — E43 Unspecified severe protein-calorie malnutrition: Secondary | ICD-10-CM | POA: Diagnosis not present

## 2021-02-25 DIAGNOSIS — R7989 Other specified abnormal findings of blood chemistry: Secondary | ICD-10-CM | POA: Diagnosis not present

## 2021-02-25 DIAGNOSIS — Z789 Other specified health status: Secondary | ICD-10-CM

## 2021-02-25 DIAGNOSIS — T1490XA Injury, unspecified, initial encounter: Secondary | ICD-10-CM

## 2021-02-25 DIAGNOSIS — G301 Alzheimer's disease with late onset: Secondary | ICD-10-CM

## 2021-02-25 DIAGNOSIS — R209 Unspecified disturbances of skin sensation: Secondary | ICD-10-CM | POA: Diagnosis not present

## 2021-02-25 DIAGNOSIS — Z515 Encounter for palliative care: Secondary | ICD-10-CM

## 2021-02-25 LAB — BASIC METABOLIC PANEL
Anion gap: 6 (ref 5–15)
BUN: 42 mg/dL — ABNORMAL HIGH (ref 8–23)
CO2: 22 mmol/L (ref 22–32)
Calcium: 9.6 mg/dL (ref 8.9–10.3)
Chloride: 110 mmol/L (ref 98–111)
Creatinine, Ser: 0.62 mg/dL (ref 0.44–1.00)
GFR, Estimated: >60 mL/min (ref >60)
Glucose, Bld: 114 mg/dL — ABNORMAL HIGH (ref 70–99)
Potassium: 4.7 mmol/L (ref 3.5–5.1)
Sodium: 138 mmol/L (ref 135–145)

## 2021-02-25 LAB — HEPATIC FUNCTION PANEL
ALT: 35 U/L (ref 0–44)
AST: 39 U/L (ref 15–41)
Albumin: 2.8 g/dL — ABNORMAL LOW (ref 3.5–5.0)
Alkaline Phosphatase: 74 U/L (ref 38–126)
Bilirubin, Direct: 0.4 mg/dL — ABNORMAL HIGH (ref 0.0–0.2)
Indirect Bilirubin: 0.7 mg/dL (ref 0.3–0.9)
Total Bilirubin: 1.1 mg/dL (ref 0.3–1.2)
Total Protein: 4.9 g/dL — ABNORMAL LOW (ref 6.5–8.1)

## 2021-02-25 LAB — CBC
HCT: 32.6 % — ABNORMAL LOW (ref 36.0–46.0)
Hemoglobin: 10.9 g/dL — ABNORMAL LOW (ref 12.0–15.0)
MCH: 33.4 pg (ref 26.0–34.0)
MCHC: 33.4 g/dL (ref 30.0–36.0)
MCV: 100 fL (ref 80.0–100.0)
Platelets: 63 10*3/uL — ABNORMAL LOW (ref 150–400)
RBC: 3.26 MIL/uL — ABNORMAL LOW (ref 3.87–5.11)
RDW: 18.5 % — ABNORMAL HIGH (ref 11.5–15.5)
WBC: 7.6 10*3/uL (ref 4.0–10.5)
nRBC: 0.3 % — ABNORMAL HIGH (ref 0.0–0.2)

## 2021-02-25 LAB — CULTURE, BLOOD (ROUTINE X 2)
Culture: NO GROWTH
Culture: NO GROWTH
Special Requests: ADEQUATE

## 2021-02-25 MED ORDER — ENSURE ENLIVE PO LIQD
237.0000 mL | ORAL | Status: DC | PRN
  Administered 2021-02-27 – 2021-02-28 (×2): 237 mL via ORAL

## 2021-02-25 NOTE — Progress Notes (Signed)
Pharmacy Antibiotic Note  Shajuan Musso Loken is a 87 y.o. female admitted on 02/20/2021 after fall at home and concern for sepsis. Pharmacy consulted for cefepime for pacemaker infection.  Started on vancomycin and cefepime for empiric sepsis coverage, discontinued vanc 11/24 per ID. Now day 6 of cefepime for pacemaker infection with no growth on cultures.  SCr stable (<1), CrCl 39 ml/min Afebrile, WBC normal  Plan:  Continue Cefepime 2 gm every 12 hours Monitor renal function F/U plans for LOT, palliative recommendations and possible switch to PO  Temp (24hrs), Avg:97.7 F (36.5 C), Min:97.5 F (36.4 C), Max:98 F (36.7 C)  Recent Labs  Lab 02/20/21 1025 02/20/21 1157 02/21/21 0324 02/22/21 0225 02/23/21 0346 02/23/21 1859 02/24/21 0329 02/24/21 1320 02/25/21 0307  WBC  --   --  8.2 8.8 7.1 10.1 5.7 7.7 7.6  CREATININE  --   --  1.12* 0.89 0.68  --  0.57  --  0.62  LATICACIDVEN 4.6* 3.4*  --   --   --   --   --   --   --     Estimated Creatinine Clearance: 30.5 mL/min (by C-G formula based on SCr of 0.62 mg/dL).    No Known Allergies  Thank you for allowing pharmacy to be a part of this patient's care.  Filbert Schilder, PharmD PGY1 Pharmacy Resident 02/25/2021  1:27 PM  Please check [email or web address] for unit-specific pharmacy phone numbers.

## 2021-02-25 NOTE — Progress Notes (Addendum)
Daily Progress Note   Patient Name: [name] [name]       Date: 02/25/2021 DOB: 06-01-1924  Age: 87 y.o. MRN#: [id number] Attending Physician: Kayleen Memos, DO Primary Care Physician: Janora Norlander, DO Admit Date: 02/20/2021  Reason for Consultation/Follow-up: Establishing goals of care  Subjective: Received page from Dr. [place] Crane - family requesting to meet with PMT as they have additional questions.  Chart review performed. Received report from primary RN - no acute concerns. Patient remains with minimal oral intake - sips only.  Went to visit patient at bedside - daughter/Donna and son/Donnie present. Patient was lying in bed asleep - she does wake to voice/gentle touch but promptly falls back asleep. She is intermittently awake during visit today. No signs or non-verbal gestures of pain or discomfort noted. No respiratory distress, increased work of breathing, or secretions noted.   Met with family to discuss disposition options per their request. Family's top concern is that patient will need 24/7 supervision on discharge. We reviewed dispo options to include: rehab with outpatient palliative care vs residential hospice vs home hospice (in private residence with private duty caregiver support vs LTC). Prognostication was reviewed in context of options.  Discussed that the goal of rehab is improvement/stabalization of functional status, which can be a difficult goal to meet for patients with advanced illness and multiple medical conditions. Reviewed what is needed for someone to have a positive rehabilitation experience to include adequate nutritional intake as well as willingness/ability to participate. Family feel with patient's minimal PO intake, lethargy, and increased confusion she  would not find benefit in rehab, which I agree. They are not interested in pursuing this option.  Reviewed that patient is likely teetering on prognosis of <2 weeks, which would make her residential hospice appropriate. Family are agreeable to send hospice referral, requesting [facility]. Family will make stepwise decisions pending their evaluation.  We talked about transition to comfort measures in house and what that would entail inclusive of medications to control pain, dyspnea, agitation, nausea, and itching. We discussed stopping all unnecessary measures such as blood draws, needle sticks, oxygen, antibiotics, CBGs/insulin, cardiac monitoring, IVF, and frequent vital signs. Family will consider transition to full comfort - they would like to process information given to them today.   Discussed discharge process per their  request. Reviewed that discharge timing would be pending family's goals - keep patient in house until medically optimized vs discharge with hospice as soon as possible knowing medical interventions will likely not change her overall outcome. Family will discuss goals.  All questions and concerns addressed. Encouraged to call with questions and/or concerns. PMT card provided.   Length of Stay: 5  Current Medications: Scheduled Meds:  . empagliflozin  10 mg Oral Daily  . feeding supplement  237 mL Oral BID BM  . levothyroxine  75 mcg Oral QAC breakfast  . memantine  5 mg Oral QHS  . multivitamin with minerals  1 tablet Oral Daily  . nutrition supplement (JUVEN)  1 packet Oral BID BM  . silver sulfADIAZINE   Topical Daily    Continuous Infusions: . sodium chloride Stopped (02/21/21 0947)  . ceFEPime (MAXIPIME) IV 2 g (02/25/21 1154)    PRN Meds: sodium chloride, acetaminophen **OR** acetaminophen, HYDROmorphone (DILAUDID) injection, ondansetron **OR** ondansetron (ZOFRAN) IV  Physical Exam Vitals and nursing note reviewed.  Constitutional:       General: She is not in acute distress.    Appearance: She is cachectic. She is ill-appearing.  Pulmonary:     Effort: No respiratory distress.  Skin:    General: Skin is warm and dry.  Neurological:     Mental Status: She is lethargic.     Motor: Weakness present.  Psychiatric:        Speech: She is noncommunicative.            Vital Signs: BP (!) 107/58 (BP Location: Right Arm)   Pulse 74   Temp (!) 97.5 F (36.4 C) (Oral)   Resp 18   Ht $R'5\' 1"'ou$  (1.549 m)   Wt 44.9 kg   SpO2 92%   BMI 18.70 kg/m  SpO2: SpO2: 92 % O2 Device: O2 Device: Room Air O2 Flow Rate:    Intake/output summary:  Intake/Output Summary (Last 24 hours) at 02/25/2021 1435 Last data filed at 02/25/2021 2423 Gross per 24 hour  Intake 200 ml  Output 500 ml  Net -300 ml   LBM: Last BM Date: 02/23/21 Baseline Weight: Weight: 46 kg Most recent weight: Weight: 44.9 kg       Palliative Assessment/Data: PPS 10-20%      Patient Active Problem List   Diagnosis Date Noted  . Elevated creatine kinase   . Elevated lactic acid level   . Pacemaker infection ([place])   . Goals of care, counseling/discussion   . Protein-calorie malnutrition, severe 02/23/2021  . Endocarditis, valve unspecified   . Cold extremities   . Recurrent falls   . Ulcer of left foot with fat layer exposed ([place])   . Sepsis ([place]) 02/20/2021  . Mild dementia 12/20/2020  . Epidermal cyst 11/03/2017  . Hypothyroidism 06/09/2017  . Vitamin D deficiency 05/16/2016  . Atrial fibrillation ([place]) 01/06/2015  . Paroxysmal atrial fibrillation ([place]) 04/01/2014  . AV BLOCK, COMPLETE 04/25/2010  . HLD (hyperlipidemia) 10/30/2008  . HYPERTENSION, BENIGN 10/30/2008  . PACEMAKER, PERMANENT 10/30/2008    Palliative Care Assessment & Plan   Patient Profile: 87 y.o. female  with past medical history of  left foot nonhealing wound x2 weeks, PAF on Eliquis, SSS status post PPM, HTN, HLD, dementia presented to ED on 02/20/21 from home after  unwitnessed fall with unknown downtime. In ED patient was found to be fluid overload, chest x-ray showed bilateral pleural effusion, his exam showed 2+ bilateral pitting edema. Patient was admitted  on 02/20/2021 with sepsis, acute/new CHF, AKI, left hip pain, transaminitis, moderate protein calorie malnutrition.   ED Course: Patient was found hypothermia temperature 93.5, blood pressure borderline, no tachycardia, patient was started on Bair hugger, CT head negative for bleeding rib fracture, rib x-ray inconclusive, blood work WBC 15.9, creatinine 1.2, bicarb 13, lactic acid 4.6. Patient was started on vancomycin and Zosyn.  Assessment: Pacemaker infection Severe foot infection with exposed tendon Sepsis Recurrent falls Severe protein calorie malnutrition AKI Concern about end of life  Recommendations/Plan: Continue current medical treatment without escalation of care - family considering transition to full comfort measures in house Continue DNR/DNI as previously documented - durable DNR form completed and placed in shadow chart. Copy was made and will be scanned into Vynca/ACP tab Family are not interested in having patient discharge to rehab/HHPT Family would like patient transferred to residential hospice facility, requesting [facility] notified and consult placed Ongoing GOC pending hospice evaluation PMT will continue to follow and support holistically  Goals of Care and Additional Recommendations: Limitations on Scope of Treatment: Avoid Hospitalization, Full Scope Treatment, No Artificial Feeding, No Surgical Procedures, and No Tracheostomy  Code Status:    Code Status Orders  (From admission, onward)           Start     Ordered   02/23/21 1544  Do not attempt resuscitation (DNR)  Continuous       Question Answer Comment  In the event of cardiac or respiratory ARREST Do not call a "code blue"   In the event of cardiac or respiratory ARREST Do not perform  Intubation, CPR, defibrillation or ACLS   In the event of cardiac or respiratory ARREST Use medication by any route, position, wound care, and other measures to relive pain and suffering. May use oxygen, suction and manual treatment of airway obstruction as needed for comfort.      02/23/21 1543           Code Status History     Date Active Date Inactive Code Status Order ID Comments User Context   02/20/2021 1303 02/23/2021 1543 Full Code [id number]  Lequita Halt, MD ED   01/15/2017 1414 01/15/2017 1810 Full Code [id number]  Thompson Grayer, MD Inpatient      Advance Directive Documentation    [place] Most Recent Value  Type of Advance Directive --  [.]  Pre-existing out of facility DNR order (yellow form or pink MOST form) --  "MOST" Form in Place? --       Prognosis:  Poor in the setting of advanced age, dementia, new CHF, sepsis, non-healing wound, risk for recurrent infections, minimal oral intake  Discharge Planning: Hospice facility   Care plan was discussed with primary RN, patient's family, Dr. Nevada Crane, [facility]  Thank you for allowing the Palliative Medicine Team to assist in the care of this patient.   Total Time 70 minutes Prolonged Time Billed  yes       Greater than 50%  of this time was spent counseling and coordinating care related to the above assessment and plan.  Lin Landsman, NP  Please contact Palliative Medicine Team phone at [phone] for questions and concerns.

## 2021-02-25 NOTE — TOC Progression Note (Addendum)
Transition of Care [facility]) - Progression Note    Patient Details  Name: [name] [name] MRN: [id number] Date of Birth: 1925-01-21  [facility] CM/SW Contact  Lockie Pares, RN Phone Number: 02/25/2021, 2:03 PM  Clinical Narrative:    Called  patients daughter  Lupita Leash left confidential message regarding discharge planning. And home health.  1450 Nessaged by Palliative to team regarding family decision for Hospicce care. They would like [facility]. [facility] and they are calling me back regarding bed situation [facility] hospice called sent the H&P, Palliative care notes, Progress notes and SSN to [phone]   Expected Discharge Plan: Home w Home Health Services Barriers to Discharge: Continued Medical Work up  Expected Discharge Plan and Services Expected Discharge Plan: Home w Home Health Services In-house Referral: Clinical Social Work Discharge Planning Services: CM Consult Post Acute Care Choice: Home Health Living arrangements for the past 2 months: Single Family Home                                       Social Determinants of Health (SDOH) Interventions    Readmission Risk Interventions No flowsheet data found.

## 2021-02-25 NOTE — Progress Notes (Signed)
Subjective: Patient without complaints   Antibiotics:  Anti-infectives (From admission, onward)    Start     Dose/Rate Route Frequency Ordered Stop   02/23/21 1215  ceFEPIme (MAXIPIME) 2 g in sodium chloride 0.9 % 100 mL IVPB        2 g 200 mL/hr over 30 Minutes Intravenous Every 12 hours 02/23/21 1128     02/22/21 2200  ceFEPIme (MAXIPIME) 2 g in sodium chloride 0.9 % 100 mL IVPB  Status:  Discontinued        2 g 200 mL/hr over 30 Minutes Intravenous Every 12 hours 02/22/21 1158 02/22/21 1159   02/22/21 1300  vancomycin (VANCOREADY) IVPB 750 mg/150 mL  Status:  Discontinued        750 mg 150 mL/hr over 60 Minutes Intravenous Every 48 hours 02/20/21 1233 02/23/21 1602   02/22/21 1245  ceFEPIme (MAXIPIME) 2 g in sodium chloride 0.9 % 100 mL IVPB  Status:  Discontinued        2 g 200 mL/hr over 30 Minutes Intravenous Every 24 hours 02/22/21 1159 02/23/21 1128   02/22/21 0900  ceFEPIme (MAXIPIME) 2 g in sodium chloride 0.9 % 100 mL IVPB  Status:  Discontinued        2 g 200 mL/hr over 30 Minutes Intravenous Every 24 hours 02/21/21 1757 02/22/21 1158   02/20/21 2000  ceFEPIme (MAXIPIME) 2 g in sodium chloride 0.9 % 100 mL IVPB  Status:  Discontinued        2 g 200 mL/hr over 30 Minutes Intravenous Every 12 hours 02/20/21 1336 02/21/21 1757   02/20/21 1200  piperacillin-tazobactam (ZOSYN) IVPB 3.375 g        3.375 g 100 mL/hr over 30 Minutes Intravenous  Once 02/20/21 1154 02/20/21 1342   02/20/21 1200  vancomycin (VANCOREADY) IVPB 1000 mg/200 mL        1,000 mg 200 mL/hr over 60 Minutes Intravenous  Once 02/20/21 1154 02/20/21 1358       Medications: Scheduled Meds:  empagliflozin  10 mg Oral Daily   levothyroxine  75 mcg Oral QAC breakfast   memantine  5 mg Oral QHS   multivitamin with minerals  1 tablet Oral Daily   nutrition supplement (JUVEN)  1 packet Oral BID BM   silver sulfADIAZINE   Topical Daily   Continuous Infusions:  sodium chloride Stopped  (02/21/21 0947)   ceFEPime (MAXIPIME) IV 2 g (02/25/21 1154)   PRN Meds:.sodium chloride, acetaminophen **OR** acetaminophen, feeding supplement, HYDROmorphone (DILAUDID) injection, ondansetron **OR** ondansetron (ZOFRAN) IV    Objective: Weight change: 0.7 kg  Intake/Output Summary (Last 24 hours) at 02/25/2021 1448 Last data filed at 02/25/2021 0806 Gross per 24 hour  Intake 200 ml  Output 500 ml  Net -300 ml    Blood pressure (!) 107/58, pulse 74, temperature (!) 97.5 F (36.4 C), temperature source Oral, resp. rate 18, height 5\' 1"  (1.549 m), weight 44.9 kg, SpO2 92 %. Temp:  [97.5 F (36.4 C)-98 F (36.7 C)] 97.5 F (36.4 C) (11/27 0340) Pulse Rate:  [51-74] 74 (11/27 1203) Resp:  [15-18] 18 (11/27 1203) BP: (101-111)/(57-58) 107/58 (11/27 1203) SpO2:  [92 %-100 %] 92 % (11/27 1203) Weight:  [44.9 kg] 44.9 kg (11/27 0340)  Physical Exam: Physical Exam Neurological:     Mental Status: She is alert.  Psychiatric:        Attention and Perception: She is inattentive.  Speech: Speech is delayed.        Behavior: Behavior is slowed. Behavior is cooperative.        Cognition and Memory: Cognition is impaired. Memory is impaired. She exhibits impaired recent memory and impaired remote memory.    Left lower extremity bandaged  CBC:    BMET Recent Labs    02/24/21 0329 02/25/21 0307  NA 138 138  K 3.8 4.7  CL 109 110  CO2 23 22  GLUCOSE 95 114*  BUN 48* 42*  CREATININE 0.57 0.62  CALCIUM 9.1 9.6      Liver Panel  Recent Labs    02/24/21 0329 02/25/21 0307  PROT 4.5* 4.9*  ALBUMIN 2.7* 2.8*  AST 38 39  ALT 32 35  ALKPHOS 68 74  BILITOT 1.3* 1.1  BILIDIR 0.3* 0.4*  IBILI 1.0* 0.7        Sedimentation Rate No results for input(s): ESRSEDRATE in the last 72 hours. C-Reactive Protein No results for input(s): CRP in the last 72 hours.  Micro Results: Recent Results (from the past 720 hour(s))  Body fluid culture w Gram Stain      Status: None   Collection Time: 02/06/21  7:05 PM   Specimen: Foot; Body Fluid  Result Value Ref Range Status   Specimen Description   Final    FOOT Performed at [facility], [address], [place], [place] [address]    Special Requests   Final    NONE Performed at [facility], [address], [place], [place] [address]    Gram Stain   Final    ABUNDANT WBC PRESENT, PREDOMINANTLY PMN NO ORGANISMS SEEN Performed at [facility], [place] [address] [place], [place] [address]    Culture   Final    RARE LECLERCIA ADECARBOXYLATA RARE PSEUDOMONAS PUTIDA    Report Status 02/10/2021 FINAL  Final   Organism ID, Bacteria LECLERCIA ADECARBOXYLATA  Final   Organism ID, Bacteria PSEUDOMONAS PUTIDA  Final      Susceptibility   Leclercia adecarboxylata - MIC*    AMPICILLIN <=2 SENSITIVE Sensitive     CEFAZOLIN <=4 SENSITIVE Sensitive     CEFEPIME <=0.12 SENSITIVE Sensitive     CEFTAZIDIME <=1 SENSITIVE Sensitive     CEFTRIAXONE <=0.25 SENSITIVE Sensitive     CIPROFLOXACIN <=0.25 SENSITIVE Sensitive     GENTAMICIN <=1 SENSITIVE Sensitive     IMIPENEM <=0.25 SENSITIVE Sensitive     TRIMETH/SULFA <=20 SENSITIVE Sensitive     AMPICILLIN/SULBACTAM <=2 SENSITIVE Sensitive     PIP/TAZO <=4 SENSITIVE Sensitive     * RARE LECLERCIA ADECARBOXYLATA   Pseudomonas putida - MIC*    CEFTAZIDIME 2 SENSITIVE Sensitive     CIPROFLOXACIN <=0.25 SENSITIVE Sensitive     GENTAMICIN <=1 SENSITIVE Sensitive     IMIPENEM 2 SENSITIVE Sensitive     PIP/TAZO 8 SENSITIVE Sensitive     * RARE PSEUDOMONAS PUTIDA  Gram stain     Status: None   Collection Time: 02/06/21  7:05 PM   Specimen: Foot; Abscess  Result Value Ref Range Status   Specimen Description FOOT  Final   Special Requests NONE  Final   Gram Stain   Final    WBC PRESENT,BOTH PMN AND MONONUCLEAR NO ORGANISMS SEEN CYTOSPIN SMEAR Performed at [facility], [address], [place],  [address]    Report Status 02/06/2021 FINAL  Final   Anaerobic and Aerobic Culture     Status: Abnormal   Collection Time: 02/14/21  4:46 PM   Specimen: Foot; Other   FO  Result Value Ref Range Status   Anaerobic Culture Final report  Final   Result 1 Comment  Final    Comment: No anaerobic growth in 72 hours.   Aerobic Culture Final report (A)  Final   Result 1 Pseudomonas putida (A)  Final    Comment: Heavy growth   Result 2 Mixed skin flora  Final    Comment: Light growth   Antimicrobial Susceptibility Comment  Final    Comment:       ** S = Susceptible; I = Intermediate; R = Resistant **                    P = Positive; N = Negative             MICS are expressed in micrograms per mL    Antibiotic                 RSLT#1    RSLT#2    RSLT#3    RSLT#4 Amikacin                       S Aztreonam                      I Cefepime                       S Cefotaxime                     S Ceftazidime                    S Ceftriaxone                    S Ciprofloxacin                  S Gentamicin                     S Imipenem                       S Levofloxacin                   S Meropenem                      S Piperacillin/Tazobactam        S Ticarcillin/Clavulanate        I Tobramycin                     S Trimethoprim/Sulfa             R   Wound culture     Status: Abnormal   Collection Time: 02/15/21  3:45 PM   Specimen: Wound  Result Value Ref Range Status   MICRO NUMBER: [id number]  Final   SPECIMEN QUALITY: Adequate  Final   SOURCE: FOOT, LEFT  Final   STATUS: FINAL  Final   GRAM STAIN:   Final    Few White blood cells seen No epithelial cells seen Moderate Gram negative bacilli   ISOLATE 1: Pseudomonas putida (A)  Final    Comment: Heavy growth of Pseudomonas putida      Susceptibility   Pseudomonas putida - AEROBIC CULT, GRAM STAIN NEGATIVE 1    CEFTAZIDIME 2  Sensitive     CEFEPIME <=1 Sensitive     CIPROFLOXACIN <=0.25 Sensitive     LEVOFLOXACIN 1 Sensitive     GENTAMICIN <=1 Sensitive     PIP/TAZO 8  Sensitive     TOBRAMYCIN* <=1 Sensitive      * Legend: S = Susceptible  I = Intermediate R = Resistant  NS = Not susceptible * = Not tested  NR = Not reported **NN = See antimicrobic comments   Culture, blood (Routine x 2)     Status: None   Collection Time: 02/20/21 10:25 AM   Specimen: BLOOD  Result Value Ref Range Status   Specimen Description BLOOD LEFT ANTECUBITAL  Final   Special Requests   Final    BOTTLES DRAWN AEROBIC AND ANAEROBIC Blood Culture adequate volume   Culture   Final    NO GROWTH 5 DAYS Performed at [facility], [address], [place], [place] [address]    Report Status 02/25/2021 FINAL  Final  Culture, blood (Routine x 2)     Status: None   Collection Time: 02/20/21 10:36 AM   Specimen: BLOOD  Result Value Ref Range Status   Specimen Description BLOOD RIGHT ANTECUBITAL  Final   Special Requests   Final    BOTTLES DRAWN AEROBIC AND ANAEROBIC Blood Culture results may not be optimal due to an inadequate volume of blood received in culture bottles   Culture   Final    NO GROWTH 5 DAYS Performed at [facility], [place] [address] [place], [place] [address]    Report Status 02/25/2021 FINAL  Final  Resp Panel by RT-PCR (Flu A&B, Covid) Nasopharyngeal Swab     Status: None   Collection Time: 02/20/21  3:34 PM   Specimen: Nasopharyngeal Swab; Nasopharyngeal(NP) swabs in vial transport medium  Result Value Ref Range Status   SARS Coronavirus 2 by RT PCR NEGATIVE NEGATIVE Final    Comment: (NOTE) SARS-CoV-2 target nucleic acids are NOT DETECTED.  The SARS-CoV-2 RNA is generally detectable in upper respiratory specimens during the acute phase of infection. The lowest concentration of SARS-CoV-2 viral copies this assay can detect is 138 copies/mL. A negative result does not preclude SARS-Cov-2 infection and should not be used as the sole basis for treatment or other patient management decisions. A negative result may occur with  improper specimen  collection/handling, submission of specimen other than nasopharyngeal swab, presence of viral mutation(s) within the areas targeted by this assay, and inadequate number of viral copies(<138 copies/mL). A negative result must be combined with clinical observations, patient history, and epidemiological information. The expected result is Negative.  Fact Sheet for Patients:  EntrepreneurPulse.com.au  Fact Sheet for Healthcare Providers:  IncredibleEmployment.be  This test is no t yet approved or cleared by the [place] FDA and  has been authorized for detection and/or diagnosis of SARS-CoV-2 by FDA under an Emergency Use Authorization (EUA). This EUA will remain  in effect (meaning this test can be used) for the duration of the COVID-19 declaration under Section 564(b)(1) of the Act, 21 U.S.C.section 360bbb-3(b)(1), unless the authorization is terminated  or revoked sooner.       Influenza A by PCR NEGATIVE NEGATIVE Final   Influenza B by PCR NEGATIVE NEGATIVE Final    Comment: (NOTE) The Xpert Xpress SARS-CoV-2/FLU/RSV plus assay is intended as an aid in the diagnosis of influenza from Nasopharyngeal swab specimens and should not be used as a sole basis for treatment. Nasal washings  and aspirates are unacceptable for Xpert Xpress SARS-CoV-2/FLU/RSV testing.  Fact Sheet for Patients: BloggerCourse.com  Fact Sheet for Healthcare Providers: SeriousBroker.it  This test is not yet approved or cleared by the [place] FDA and has been authorized for detection and/or diagnosis of SARS-CoV-2 by FDA under an Emergency Use Authorization (EUA). This EUA will remain in effect (meaning this test can be used) for the duration of the COVID-19 declaration under Section 564(b)(1) of the Act, 21 U.S.C. section 360bbb-3(b)(1), unless the authorization is terminated or revoked.  Performed at [facility], [address], [place], [place] [address]   MRSA Next Gen by PCR, Nasal     Status: None   Collection Time: 02/22/21  4:59 PM   Specimen: Nasal Mucosa; Nasal Swab  Result Value Ref Range Status   MRSA by PCR Next Gen NOT DETECTED NOT DETECTED Final    Comment: (NOTE) The GeneXpert MRSA Assay (FDA approved for NASAL specimens only), is one component of a comprehensive MRSA colonization surveillance program. It is not intended to diagnose MRSA infection nor to guide or monitor treatment for MRSA infections. Test performance is not FDA approved in patients less than 22 years old. Performed at [facility], [address], [place], [place] [address]     Studies/Results: No results found.    Assessment/Plan:  INTERVAL HISTORY: Patient seen by Dr. 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 female with advanced dementia with complete heart block and pacemaker who had stepped on a yellowjacket nest treated with prednisone Zyrtec and then with worsening of her wound which now encompasses the entire of her dorsal foot with exposed tendon.  She was admitted with severe sepsis responded to IV antibiotics and fluids.  Her blood cultures have not yielded any organism.  She was on broad-spectrum antibiotics with vancomycin and Zosyn and then vancomycin and cefepime with last dose of vancomycin on the 24th.  She grew a pseudomonal species from her wound from specimen taken in clinic.  Her 2D echocardiogram suggest a vegetation on her  pacemaker.  I agree with cardiology electrophysiology that performing a TEE is not appropriate given her frailty.  Device extraction also is not a possibility.  #1 pacemaker infection:  Fortunately as mentioned the patient is not bacteremic.  Unfortunately from a diagnostic standpoint this makes it not possible for [place] to know the organism (s) involved at this site   Certainly it COULD be the pseudomonas but I do not know  IF we target the pseudomonas I would not want to send her home with IV therapy.  Oral therapy is a possibility with either high-dose levofloxacin or oral ciprofloxacin.    I am willing to give the patient 6 weeks of therapy with a floroquinolone if family can accept the risk of C. difficile colitis.  I am also willing to give her a different oral antibiotic to cover possible organisms involving her pacemaker lead and possibly involving her foot.  We had again lengthy discussion again regarding options for  treatment as mentioned above and also including not giving her antibiotics beyond what we have done here to stabilize her.  #2  Severe foot infection with exposed tendon: This is one of the main reasons to consider giving antibiotics in the hopes that will prevent her becoming acutely ill though I I am not at all confident that we can prevent that even with antibiotics.      #3  Goals of care: Son and daughter would prefer for their mom to be taken care of in the inpatient hospice unit. If not they would desire home hospice.   They showed me the card from palliative care team nurse that I have not seen a note from them.  Most of the notes from [facility] management have mentioned going home with home health or going to a skilled nursing facility.  To me this patient without doubt would meet criteria for hospice and I do not really understand why it that would not be the [place].  She has advanced dementia she has a severe foot ulcer that puts her at risk for recurrent  admissions to the hospital with sepsis she also has apparently an endovascular infection with a vegetation on her pacemaker which also puts her at risk of becoming suddenly acutely ill and septic.  I spent 44 minutes with the patient and her surrogates namely the daughter and the son discussing her wound the relevance of the culture data the pacemaker infection seen on 2D echocardiogram, her overall condition and mainly the goals of care globally but also with regards to antibiotics and in coordination of her care.  Alcide Evener 02/25/2021, 2:48 PM

## 2021-02-25 NOTE — Progress Notes (Signed)
PROGRESS NOTE  [name] Boardauline L Nesbit ZOX:[id number] DOB: [birth year] DOA: 02/20/2021 PCP: Raliegh IpGottschalk, Ashly M, DO  HPI/Recap of past 24 hours: [name] BoardPauline L [name] is a 87 y.o. female with medical history significant of left foot nonhealing wound x2 weeks, PAF on Eliquis, SSS status post PPM, HTN, HLD, dementia (lives alone), presented with fall at home.  Daughter reported that patient sustained an open wound of left dorsal foot about 2 weeks ago, for which patient completed 7 days of Augmentin and had outpatient debridement 5 days prior to presentation with Dr. Lajoyce Cornersuda.  Was taking doxycycline for last 4 days prior to presentation.  Found on the floor and complained about left hip pain and unable to get up herself.  In the ED found to be fluid overload, chest x-ray showed bilateral pleural effusion, 2+ pitting edema in lower extremity bilaterally, daughter reported the patient's legs "are always this big and puffy".  Patient was started on IV vancomycin and Zosyn empirically.  SUBJECTIVE: 02/25/2021: Patient was seen and examined at bedside.  There were no acute events overnight.  She has no new complaints.  She denies having any pain in her left foot.  She is alert and pleasantly confused.  Discussed plan with her son and also daughter via phone.  They do not want any aggressive treatment.  Assessment/Plan: Principal Problem:   Pacemaker infection (HCC) Active Problems:   Sepsis (HCC)   Cold extremities   Recurrent falls   Ulcer of left foot with fat layer exposed (HCC)   Protein-calorie malnutrition, severe   Endocarditis, valve unspecified   Elevated creatine kinase   Elevated lactic acid level   Goals of care, counseling/discussion  Sepsis secondary to left foot wound infection, sepsis criteria has resolved. Evidenced by leukocytosis, elevated lactate, signs of endorgan damage of acute metabolic encephalopathy/confusion and AKI, source is left foot deep wound infection - Outpatient superficial  culture shows Pseudomonas on 02/15/21 - Blood cx NGTD - Continue cefepime, vanc dcd per ID - ID consulting; appreciate recs - Per Ortho, no plans for surgery; f/u outpatient with Dr. Lajoyce Cornersuda Continue local wound care.  Pacemaker infection, seen on 2D echo Blood cultures peripherally negative to date. Wound culture grew Pseudomonas, continue cefepime as recommended by infectious disease. Family has declined aggressive management. No plan for invasive procedures Rest of management per infectious disease  Acute blood loss anemia with positive FOBT with concern for upper GI bleed. Drop in hemoglobin 10.0 from 12.7 on 02/24/2021. Dark stools reported on 02/23/2021, heparin drip was stopped. Repeat hemoglobin up-trending Continue to monitor H&H  Thrombocytopenia, worsening Platelet count downtrending, off anticoagulation.   New/acute systolic CHF Daughter reported patient at baseline active, no complaint of chest pain or shortness of breath with moderate activity. Could consider stress cardiomyopathy given acute illness. - TTE on 02/21/2021 showed LVEF 25 to 30%.   Strict I's and O's and daily weight  Bilateral lower extremity edema, improving Has been going on for some time, likely impairing wound healing. Potentially related to heart failure vs venous insufficiency; liikely multifactorial - diuretic prn  Abnormal Echocardiogram  Right atrium thrombus vs vegetation TTE w/small mobile echodensity adherent to pacemaker lead. - EP consulted; would favor treating empirically rather than pursuing TEE and lead extraction given age, advanced dementia and risks associated w/procedure   Resolved prerenal AKI Likely secondary to dehydration from poor oral intake. Renal function is back to baseline   Resolving abnormal LFTS - No RUQ tenderness, likely secondary to decompensation of, and or  mild rhabdomyolysis Direct bilirubin 0.41.  Rest of LFTs normalized.  Abnormal right toe brachial  index Per ultrasound report, although ankle-brachial indices are within normal limits, arterial Doppler waveforms at the ankle suggest some component of arterial occlusive disease  Prolonged QTC QTC 551 on 02/24/2021. Avoid QTC prolonging agents Optimize magnesium and potassium levels.  Left hip pain, POA - Pelvic x-ray non-conclusive. No fracture seen on CT scan. - pain meds prn, cool compress   A. fib, paroxysmal Rate controlled. Currently off anticoagulation due to concern for bleeding and due to worsening thrombocytopenia  HTN BP is at goal, not on oral antihypertensives. - Continue to hold off home HCTZ; resume as able   Mild rhabdomyolysis Likely related to Poor PO intake and AKI - CK downdrended - S/p hydration; no further IV fluid due to systolic CHF.   Severe protein calorie malnutrition - Dietitian following - Encourage oral protein calorie intake - liberalize diet  Generalized weakness She was assessed by PT OT with recommendation for home health PT OT. TOC assisting with DC planning. Continue fall precautions  Goals of care Discussed with patient's family regarding aggressive vs more palliative measures. Recommended DNR, patient's family agreeable and are looking into directives. DNR Palliative care team consulted to assist with establishing goals of care.  Critical care time: 65 minutes.    DVT prophylaxis: SCDs Code Status: DNR Family Communication: Son and daughter via phone. Disposition Plan: Expect more than 2 midnight hospital stay Consults called: Orthopedic surgery, cardiology/EP, infectious disease, palliative care team. Admission status: Tele admit     Objective: Vitals:   02/24/21 2041 02/25/21 0337 02/25/21 0340 02/25/21 1203  BP: (!) 111/57 (!) 101/58 (!) 101/58 (!) 107/58  Pulse: (!) 51 (!) 58 (!) 57 74  Resp: 15 16 16 18   Temp: 98 F (36.7 C) (!) 97.5 F (36.4 C) (!) 97.5 F (36.4 C)   TempSrc: Oral Oral Oral   SpO2: 100% 100%  99% 92%  Weight:   44.9 kg   Height:        Intake/Output Summary (Last 24 hours) at 02/25/2021 1239 Last data filed at 02/25/2021 [id number] Gross per 24 hour  Intake 380 ml  Output 750 ml  Net -370 ml   Filed Weights   02/23/21 0224 02/24/21 0454 02/25/21 0340  Weight: 46.3 kg 44.2 kg 44.9 kg    Exam:  General: 87 y.o. year-old female frail-appearing no acute distress.  She is alert and pleasantly confused.   Cardiovascular: Irregular rate and rhythm no rubs or gallops.  No JVD or thyromegaly noted.   Respiratory: Clear to auscultation with no wheezes or rales. Abdomen: Soft nontender normal bowel sounds present. Musculoskeletal: 1+ pitting edema in lower extremities bilaterally. Skin: Left foot is wrapped with wound dressing Psychiatry: Mood is appropriate for condition and setting. Neuro: She moves all 4 extremities equally.   Data Reviewed: CBC: Recent Labs  Lab 02/20/21 0947 02/20/21 1021 02/23/21 0346 02/23/21 1859 02/24/21 0329 02/24/21 1320 02/25/21 0307  WBC 15.9*   < > 7.1 10.1 5.7 7.7 7.6  NEUTROABS 14.3*  --   --   --   --   --   --   HGB 17.1*   < > 11.9* 12.7 10.0* 11.2* 10.9*  HCT 50.9*   < > 35.1* 37.7 29.6* 32.7* 32.6*  MCV 100.8*   < > 98.6 101.1* 97.4 99.4 100.0  PLT 106*   < > 71* 80* 60* 64* 63*   < > = values  in this interval not displayed.   Basic Metabolic Panel: Recent Labs  Lab 02/21/21 0324 02/22/21 0225 02/23/21 0346 02/24/21 0329 02/25/21 0307  NA 137 137 138 138 138  K 3.7 3.7 4.0 3.8 4.7  CL 107 109 110 109 110  CO2 18* 19* 22 23 22   GLUCOSE 66* 104* 92 95 114*  BUN 33* 46* 45* 48* 42*  CREATININE 1.12* 0.89 0.68 0.57 0.62  CALCIUM 9.6 9.8 9.3 9.1 9.6   GFR: Estimated Creatinine Clearance: 30.5 mL/min (by C-G formula based on SCr of 0.62 mg/dL). Liver Function Tests: Recent Labs  Lab 02/21/21 0324 02/22/21 0225 02/23/21 0346 02/24/21 0329 02/25/21 0307  AST 70* 55* 40 38 39  ALT 39 36 33 32 35  ALKPHOS 72 73 63 68  74  BILITOT 1.9* 1.2 1.3* 1.3* 1.1  PROT 5.8* 5.3* 4.8* 4.5* 4.9*  ALBUMIN 3.9 3.4* 2.9* 2.7* 2.8*   No results for input(s): LIPASE, AMYLASE in the last 168 hours. No results for input(s): AMMONIA in the last 168 hours. Coagulation Profile: Recent Labs  Lab 02/20/21 1147 02/20/21 1229  INR 1.4* 1.4*   Cardiac Enzymes: Recent Labs  Lab 02/20/21 0947 02/21/21 0324 02/23/21 0912  CKTOTAL 977* 481* 65   BNP (last 3 results) No results for input(s): PROBNP in the last 8760 hours. HbA1C: No results for input(s): HGBA1C in the last 72 hours. CBG: No results for input(s): GLUCAP in the last 168 hours. Lipid Profile: No results for input(s): CHOL, HDL, LDLCALC, TRIG, CHOLHDL, LDLDIRECT in the last 72 hours. Thyroid Function Tests: No results for input(s): TSH, T4TOTAL, FREET4, T3FREE, THYROIDAB in the last 72 hours.  Anemia Panel: No results for input(s): VITAMINB12, FOLATE, FERRITIN, TIBC, IRON, RETICCTPCT in the last 72 hours. Urine analysis: No results found for: COLORURINE, APPEARANCEUR, LABSPEC, PHURINE, GLUCOSEU, HGBUR, BILIRUBINUR, KETONESUR, PROTEINUR, UROBILINOGEN, NITRITE, LEUKOCYTESUR Sepsis Labs: @LABRCNTIP (procalcitonin:4,lacticidven:4)  ) Recent Results (from the past 240 hour(s))  Wound culture     Status: Abnormal   Collection Time: 02/15/21  3:45 PM   Specimen: Wound  Result Value Ref Range Status   MICRO NUMBER: [id number]  Final   SPECIMEN QUALITY: Adequate  Final   SOURCE: FOOT, LEFT  Final   STATUS: FINAL  Final   GRAM STAIN:   Final    Few White blood cells seen No epithelial cells seen Moderate Gram negative bacilli   ISOLATE 1: Pseudomonas putida (A)  Final    Comment: Heavy growth of Pseudomonas putida      Susceptibility   Pseudomonas putida - AEROBIC CULT, GRAM STAIN NEGATIVE 1    CEFTAZIDIME 2 Sensitive     CEFEPIME <=1 Sensitive     CIPROFLOXACIN <=0.25 Sensitive     LEVOFLOXACIN 1 Sensitive     GENTAMICIN <=1 Sensitive     PIP/TAZO 8  Sensitive     TOBRAMYCIN* <=1 Sensitive      * Legend: S = Susceptible  I = Intermediate R = Resistant  NS = Not susceptible * = Not tested  NR = Not reported **NN = See antimicrobic comments   Culture, blood (Routine x 2)     Status: None   Collection Time: 02/20/21 10:25 AM   Specimen: BLOOD  Result Value Ref Range Status   Specimen Description BLOOD LEFT ANTECUBITAL  Final   Special Requests   Final    BOTTLES DRAWN AEROBIC AND ANAEROBIC Blood Culture adequate volume   Culture   Final    NO GROWTH 5  DAYS Performed at [facility], [address] [place], [place] [address]    Report Status 02/25/2021 FINAL  Final  Culture, blood (Routine x 2)     Status: None   Collection Time: 02/20/21 10:36 AM   Specimen: BLOOD  Result Value Ref Range Status   Specimen Description BLOOD RIGHT ANTECUBITAL  Final   Special Requests   Final    BOTTLES DRAWN AEROBIC AND ANAEROBIC Blood Culture results may not be optimal due to an inadequate volume of blood received in culture bottles   Culture   Final    NO GROWTH 5 DAYS Performed at [facility], [address], [place], [place] [address]    Report Status 02/25/2021 FINAL  Final  Resp Panel by RT-PCR (Flu A&B, Covid) Nasopharyngeal Swab     Status: None   Collection Time: 02/20/21  3:34 PM   Specimen: Nasopharyngeal Swab; Nasopharyngeal(NP) swabs in vial transport medium  Result Value Ref Range Status   SARS Coronavirus 2 by RT PCR NEGATIVE NEGATIVE Final    Comment: (NOTE) SARS-CoV-2 target nucleic acids are NOT DETECTED.  The SARS-CoV-2 RNA is generally detectable in upper respiratory specimens during the acute phase of infection. The lowest concentration of SARS-CoV-2 viral copies this assay can detect is 138 copies/mL. A negative result does not preclude SARS-Cov-2 infection and should not be used as the sole basis for treatment or other patient management decisions. A negative result may occur with  improper specimen  collection/handling, submission of specimen other than nasopharyngeal swab, presence of viral mutation(s) within the areas targeted by this assay, and inadequate number of viral copies(<138 copies/mL). A negative result must be combined with clinical observations, patient history, and epidemiological information. The expected result is Negative.  Fact Sheet for Patients:  BloggerCourse.com  Fact Sheet for Healthcare Providers:  SeriousBroker.it  This test is no t yet approved or cleared by the [place] FDA and  has been authorized for detection and/or diagnosis of SARS-CoV-2 by FDA under an Emergency Use Authorization (EUA). This EUA will remain  in effect (meaning this test can be used) for the duration of the COVID-19 declaration under Section 564(b)(1) of the Act, 21 U.S.C.section 360bbb-3(b)(1), unless the authorization is terminated  or revoked sooner.       Influenza A by PCR NEGATIVE NEGATIVE Final   Influenza B by PCR NEGATIVE NEGATIVE Final    Comment: (NOTE) The Xpert Xpress SARS-CoV-2/FLU/RSV plus assay is intended as an aid in the diagnosis of influenza from Nasopharyngeal swab specimens and should not be used as a sole basis for treatment. Nasal washings and aspirates are unacceptable for Xpert Xpress SARS-CoV-2/FLU/RSV testing.  Fact Sheet for Patients: BloggerCourse.com  Fact Sheet for Healthcare Providers: SeriousBroker.it  This test is not yet approved or cleared by the [place] FDA and has been authorized for detection and/or diagnosis of SARS-CoV-2 by FDA under an Emergency Use Authorization (EUA). This EUA will remain in effect (meaning this test can be used) for the duration of the COVID-19 declaration under Section 564(b)(1) of the Act, 21 U.S.C. section 360bbb-3(b)(1), unless the authorization is terminated or revoked.  Performed at [facility], [address], [place], [place] [address]   MRSA Next Gen by PCR, Nasal     Status: None   Collection Time: 02/22/21  4:59 PM   Specimen: Nasal Mucosa; Nasal Swab  Result Value Ref Range Status   MRSA by PCR Next Gen NOT DETECTED NOT  DETECTED Final    Comment: (NOTE) The GeneXpert MRSA Assay (FDA approved for NASAL specimens only), is one component of a comprehensive MRSA colonization surveillance program. It is not intended to diagnose MRSA infection nor to guide or monitor treatment for MRSA infections. Test performance is not FDA approved in patients less than 59 years old. Performed at [facility], [place] [address] [place], [place] [address]       Studies: No results found.  Scheduled Meds:  empagliflozin  10 mg Oral Daily   feeding supplement  237 mL Oral BID BM   levothyroxine  75 mcg Oral QAC breakfast   memantine  5 mg Oral QHS   multivitamin with minerals  1 tablet Oral Daily   nutrition supplement (JUVEN)  1 packet Oral BID BM   silver sulfADIAZINE   Topical Daily    Continuous Infusions:  sodium chloride Stopped (02/21/21 0947)   ceFEPime (MAXIPIME) IV 2 g (02/25/21 1154)     LOS: 5 days     Kayleen Memos, MD [facility] Pager [phone]  02/25/2021, 12:39 PM

## 2021-02-26 DIAGNOSIS — R748 Abnormal levels of other serum enzymes: Secondary | ICD-10-CM | POA: Diagnosis not present

## 2021-02-26 DIAGNOSIS — T827XXD Infection and inflammatory reaction due to other cardiac and vascular devices, implants and grafts, subsequent encounter: Secondary | ICD-10-CM

## 2021-02-26 DIAGNOSIS — R7989 Other specified abnormal findings of blood chemistry: Secondary | ICD-10-CM | POA: Diagnosis not present

## 2021-02-26 DIAGNOSIS — A419 Sepsis, unspecified organism: Secondary | ICD-10-CM | POA: Diagnosis not present

## 2021-02-26 DIAGNOSIS — R209 Unspecified disturbances of skin sensation: Secondary | ICD-10-CM | POA: Diagnosis not present

## 2021-02-26 LAB — HEPATIC FUNCTION PANEL
ALT: 36 U/L (ref 0–44)
AST: 36 U/L (ref 15–41)
Albumin: 2.8 g/dL — ABNORMAL LOW (ref 3.5–5.0)
Alkaline Phosphatase: 80 U/L (ref 38–126)
Bilirubin, Direct: 0.3 mg/dL — ABNORMAL HIGH (ref 0.0–0.2)
Indirect Bilirubin: 0.9 mg/dL (ref 0.3–0.9)
Total Bilirubin: 1.2 mg/dL (ref 0.3–1.2)
Total Protein: 5.2 g/dL — ABNORMAL LOW (ref 6.5–8.1)

## 2021-02-26 LAB — RESP PANEL BY RT-PCR (FLU A&B, COVID) ARPGX2
Influenza A by PCR: NEGATIVE
Influenza B by PCR: NEGATIVE
SARS Coronavirus 2 by RT PCR: POSITIVE — AB

## 2021-02-26 MED ORDER — JUVEN PO PACK: 1.0000 | PACK | Freq: Two times a day (BID) | ORAL | 0 refills | Status: AC

## 2021-02-26 NOTE — TOC Transition Note (Addendum)
Transition of Care [facility]) - CM/SW Discharge Note   Patient Details  Name: Nela Bascom Blakley MRN: [id number] Date of Birth: 1924/06/22  [facility] CM/SW Contact:  Lynett Grimes Phone Number: 02/26/2021, 12:45 PM   Clinical Narrative:    Patient will DC to: [facility] Anticipated DC date: 02/28/2021 Family notified: Pt daughter Transport by: Sharin Mons   Per MD patient ready for DC to [facility]. RN to call report prior to discharge [phone]). RN, patient, patient's family, and facility notified of DC. Discharge Summary and FL2 sent to facility. DC packet on chart. Ambulance transport requested for patient.   CSW will sign off for now as social work intervention is no longer needed. Please consult [place] again if new needs arise.       Barriers to Discharge: Continued Medical Work up   Patient Goals and CMS Choice  SNF vs. Hospice       Discharge Placement  [facility]                     Discharge Plan and Services In-house Referral: Clinical Social Work Discharge Planning Services: CM Consult Post Acute Care Choice: Home Health                               Social Determinants of Health (SDOH) Interventions     Readmission Risk Interventions No flowsheet data found.

## 2021-02-26 NOTE — Progress Notes (Signed)
Not candidate for device extraction.  She has a pacemaker, and no changes will need to be made in the event hospice/comfort care is pursued.  EP will sign off. Please call back with any questions.   Casimiro Needle [address] [place], PA-C  02/26/2021 7:04 AM

## 2021-02-26 NOTE — Care Management Important Message (Signed)
Important Message  Patient Details  Name: [name] [name] MRN: [id number] Date of Birth: [birth year]   Medicare Important Message Given:  Yes     [name] 02/26/2021, 10:26 AM

## 2021-02-26 NOTE — Progress Notes (Signed)
Physical Therapy Treatment Patient Details Name: [name] [name] MRN: [id number] DOB: May 19, 1924 Today's Date: 02/26/2021   History of Present Illness Pt is a 87 year old woman admitted after falling on 02/20/21.  Pt + sepsis PMH: non healing wound on dorsum of L foot sustained when walking in her yard barefoot and encountering yellow jackets, has been being treated by PCP and orthopedist wth outpt debridement, HLD, PAF, hypothyroid, SSS with pacemaker, mild dementia.    PT Comments    Pt was seen for mobility on the chair as she is not feeling up to walking during PT session.  Son in the room to discuss her follow up care, and reported that pt would be getting Hospice services.  Did not have a specific venue listed in recent notes, will await further information about her care and PT services that are needed.  For now follow up with acute PT goals as written, focusing on tolerance for gait and exercises.     Recommendations for follow up therapy are one component of a multi-disciplinary discharge planning process, led by the attending physician.  Recommendations may be updated based on patient status, additional functional criteria and insurance authorization.  Follow Up Recommendations  Home health PT until further information is received     Assistance Recommended at Discharge Frequent or constant Supervision/Assistance  Equipment Recommendations  Rollator (4 wheels)    Recommendations for Other Services       Precautions / Restrictions Precautions Precautions: Fall Precaution Comments: L foot wound Required Braces or Orthoses: Other Brace Other Brace: Prevalon boots in room for BLE's Restrictions Weight Bearing Restrictions: No     Mobility  Bed Mobility Overal bed mobility: Needs Assistance             General bed mobility comments: up in chair when PT arrived    Transfers Overall transfer level: Needs assistance                 General transfer comment:  declines to stand    Ambulation/Gait                   Stairs             Wheelchair Mobility    Modified Rankin (Stroke Patients Only)       Balance Overall balance assessment: Needs assistance Sitting-balance support: Bilateral upper extremity supported Sitting balance-Leahy Scale: Good                                      Cognition Arousal/Alertness: Awake/alert Behavior During Therapy: WFL for tasks assessed/performed Overall Cognitive Status: History of cognitive impairments - at baseline                                 General Comments: dementia, HOH        Exercises General Exercises - Lower Extremity Ankle Circles/Pumps: AAROM;10 reps Quad Sets: AAROM;10 reps Heel Slides: AAROM;10 reps Hip ABduction/ADduction: AAROM;10 reps Straight Leg Raises: AAROM;10 reps    General Comments General comments (skin integrity, edema, etc.): pt was assisted to move LE's but her son is in the room and reports she is moving to Hospice care now, had just been decided      Pertinent Vitals/Pain Pain Assessment: Faces Faces Pain Scale: Hurts a little bit Pain Location: L foot Pain Descriptors /  Indicators: Guarding Pain Intervention(s): Monitored during session;Repositioned    Home Living                          Prior Function            PT Goals (current goals can now be found in the care plan section) Acute Rehab PT Goals Patient Stated Goal: none stated today    Frequency    Min 3X/week      PT Plan Current plan remains appropriate    Co-evaluation              AM-PAC PT "6 Clicks" Mobility   Outcome Measure  Help needed turning from your back to your side while in a flat bed without using bedrails?: A Little Help needed moving from lying on your back to sitting on the side of a flat bed without using bedrails?: A Little Help needed moving to and from a bed to a chair (including a  wheelchair)?: A Little Help needed standing up from a chair using your arms (e.g., wheelchair or bedside chair)?: A Little Help needed to walk in hospital room?: A Little Help needed climbing 3-5 steps with a railing? : Total 6 Click Score: 16    End of Session   Activity Tolerance: Patient tolerated treatment well Patient left: in chair;with call bell/phone within reach;with chair alarm set;with family/visitor present Nurse Communication: Mobility status PT Visit Diagnosis: Other abnormalities of gait and mobility (R26.89);Muscle weakness (generalized) (M62.81);Difficulty in walking, not elsewhere classified (R26.2)     Time: [id number] PT Time Calculation (min) (ACUTE ONLY): 24 min  Charges:  $Therapeutic Exercise: 23-37 mins                Ramond Dial 02/26/2021, 12:10 PM  Mee Hives, PT PhD Acute Rehab Dept. Number: [place] [facility] [place]

## 2021-02-26 NOTE — Discharge Summary (Addendum)
Discharge Summary  [name] [name] [id number] DOB: 23-Jul-1924  PCP: Janora Norlander, DO  Admit date: 02/20/2021 Discharge date: 02/26/2021  Time spent: 35 minutes  Recommendations for Outpatient Follow-up:  Continue with hospice care.  Discharge Diagnoses:  Active Hospital Problems   Diagnosis Date Noted   Pacemaker infection ([place])    Severe late onset Alzheimer's dementia without behavioral disturbance, psychotic disturbance, mood disturbance, or anxiety (HCC)    Elevated creatine kinase    Elevated lactic acid level    Goals of care, counseling/discussion    Protein-calorie malnutrition, severe 02/23/2021   Endocarditis, valve unspecified    Cold extremities    Recurrent falls    Ulcer of left foot with fat layer exposed ([place])    Sepsis ([place]) 02/20/2021    Resolved Hospital Problems  No resolved problems to display.    Discharge Condition: Stable  Diet recommendation: Resume previous diet.  Vitals:   02/25/21 2046 02/26/21 0419  BP: 103/64 (!) 103/58  Pulse: (!) 58 61  Resp: 16 17  Temp: (!) 97.5 F (36.4 C) (!) 97.4 F (36.3 C)  SpO2: 97%     History of present illness:  [name] [name] is a 87 y.o. female with medical history significant of left foot nonhealing wound x2 weeks, PAF on Eliquis, SSS status post PPM, HTN, HLD, dementia (lives alone), presented with fall at home.  Daughter reported that patient sustained an open wound of left dorsal foot about 2 weeks ago, for which patient completed 7 days of oral antibiotics and had outpatient debridement 5 days prior to presentation to the ED with orthopedic surgery, Dr. Sharol Given.  Was taking doxycycline for the last 4 days prior to admission.  She was found on the floor at her home and was unable to get up on her own.  Was brought in to the ED via EMS.  In the ED, she was found to be volume overload, chest x-ray showed bilateral pleural effusions, 2+ pitting edema in lower extremity bilaterally.  Patient was  started on IV vancomycin and Zosyn empirically due to concern for sepsis secondary to left foot wound infection.  IV diuretics were held due to sepsis.  Hospital course complicated by pacemaker infection for which cardiology electrophysiology and ID were consulted.  Per cardiology electrophysiology to perform a TEE is not appropriate given her frailty, device extraction is also not a possibility.  During the course of her hospitalization, IV antibiotics were switched, Zosyn was changed to IV cefepime.  Superficial wound culture obtained outpatient, returned positive for Pseudomonas Put[place], pansensitive.  IV vancomycin was discontinued on 02/22/2021 and IV cefepime was continued.  She was seen by infectious disease.  She was also seen by orthopedic surgery with plan to continue local wound care outpatient with possible skin grafting once the wound bed is healthy and viable.   Palliative care team was consulted to assist with establishing goals of care.  The patient's son and daughter have decided not to continue antibiotics at discharge.  Family has made decision for hospice care.  02/26/2021: Seen at her bedside.  Minimally interactive.  Not in distress.  She has no new complaints.  Hospital Course:  Principal Problem:   Pacemaker infection [facility]) Active Problems:   Sepsis ([place])   Cold extremities   Recurrent falls   Ulcer of left foot with fat layer exposed ([place])   Protein-calorie malnutrition, severe   Endocarditis, valve unspecified   Elevated creatine kinase   Elevated lactic acid level  Goals of care, counseling/discussion   Severe late onset Alzheimer's dementia without behavioral disturbance, psychotic disturbance, mood disturbance, or anxiety (HCC)  Sepsis secondary to left foot wound infection, sepsis criteria has resolved. Evidenced by leukocytosis, elevated lactate, signs of endorgan damage of acute metabolic encephalopathy/confusion and AKI, source is left foot deep wound  infection - Outpatient superficial culture shows Pseudomonas Putida, pansensitive (obtained on 02/15/21) - Blood cx NGTD Received cefepime IV vancomycin dc'd per ID on 02/22/2021. Seen by orthopedic surgery and ID. Orthopedic surgery's plan was as followed: to continue local wound care outpatient with possible skin grafting once the wound bed is healthy and viable.    Pacemaker infection, seen on 2D echo Blood cultures peripherally negative final. Wound culture grew Pseudomonas, received cefepime as recommended by infectious disease. Family has declined aggressive management. No plan for invasive procedures Rest of management per infectious disease Family has decided not to continue antibiotics at discharge.   Acute blood loss anemia with positive FOBT with concern for upper GI bleed. Drop in hemoglobin 10.0 from 12.7 on 02/24/2021. Dark stools reported on 02/23/2021 with positive FOBT, heparin drip was stopped. Last hemoglobin 10.9 K, hematocrit 32.6.   Thrombocytopenia Platelet count downtrending, off anticoagulation. Last platelet count 63,000   Newly diagnosed/acute systolic CHF - TTE on [id number] showed LVEF 25 to 30%.   Strict I's and O's and daily weight   Bilateral lower extremity edema, improving Has been going on for some time, likely impairing wound healing. Potentially related to heart failure vs venous insufficiency; liikely multifactorial  Abnormal Echocardiogram  Right atrium thrombus vs vegetation TTE w/small mobile echodensity adherent to pacemaker lead. - EP consulted; would favor treating empirically rather than pursuing TEE and lead extraction given age, advanced dementia and risks associated w/procedure   Resolved prerenal AKI Likely secondary to dehydration from poor oral intake. Renal function is back to baseline   Resolving abnormal LFTS - No RUQ tenderness, likely secondary to decompensation of, and or mild rhabdomyolysis Direct bilirubin 0.41.   Rest of LFTs normalized.   Abnormal right toe brachial index Per ultrasound report, although ankle-brachial indices are within normal limits, arterial Doppler waveforms at the ankle suggest some component of arterial occlusive disease   Prolonged QTC QTC 551 on 02/24/2021. Avoid QTC prolonging agents Last serum potassium 4.7.   Left hip pain, POA - Pelvic x-ray non-conclusive. No fracture seen on CT scan. - pain meds prn, cool compress   A. fib, paroxysmal Rate controlled. Currently off anticoagulation due to concern for bleeding and due to worsening thrombocytopenia   HTN BP is stable, not on oral antihypertensives. - Continue to hold off home HCTZ   Mild rhabdomyolysis Likely related to Poor PO intake and AKI - CK downdrended - S/p hydration; received judicious IV fluid due to acute systolic CHF.   Severe protein calorie malnutrition -Seen by dietitian - Encourage oral protein calorie intake - liberalize diet -Continue oral supplement   Generalized weakness Continue fall precautions   Goals of care Palliative care team consulted to assist with establishing goals of care. DNR, plan to DC to hospice residence.    Consults called: Orthopedic surgery, cardiology/EP, infectious disease, palliative care team.  Procedure: 2D echo.  Discharge Exam: BP (!) 103/58 (BP Location: Left Arm)   Pulse 61   Temp (!) 97.4 F (36.3 C) (Oral)   Resp 17   Ht 5\' 1"  (1.549 m)   Wt 43.7 kg   SpO2 97%   BMI 18.20 kg/m  General:  87 y.o. year-old female frail-appearing in no acute distress.  Alert and pleasantly confused. Cardiovascular: Regular rate and rhythm with no rubs or gallops.  No thyromegaly or JVD noted.   Respiratory: Clear to auscultation with no wheezes or rales. Good inspiratory effort. Abdomen: Soft nontender nondistended with normal bowel sounds x4 quadrants. Musculoskeletal: Trace lower extremity edema Skin:  Psychiatry: Mood is appropriate for condition and  setting  Discharge Instructions You were cared for by a hospitalist during your hospital stay. If you have any questions about your discharge medications or the care you received while you were in the hospital after you are discharged, you can call the unit and asked to speak with the hospitalist on call if the hospitalist that took care of you is not available. Once you are discharged, your primary care physician will handle any further medical issues. Please note that NO REFILLS for any discharge medications will be authorized once you are discharged, as it is imperative that you return to your primary care physician (or establish a relationship with a primary care physician if you do not have one) for your aftercare needs so that they can reassess your need for medications and monitor your lab values.   Allergies as of 02/26/2021   No Known Allergies      Medication List     STOP taking these medications    apixaban 5 MG Tabs tablet Commonly known as: Eliquis   atorvastatin 10 MG tablet Commonly known as: LIPITOR   ciprofloxacin 500 MG tablet Commonly known as: CIPRO   diltiazem 120 MG 24 hr capsule Commonly known as: CARDIZEM CD   doxycycline 100 MG tablet Commonly known as: VIBRA-TABS   hydrochlorothiazide 12.5 MG tablet Commonly known as: HYDRODIURIL   levothyroxine 75 MCG tablet Commonly known as: SYNTHROID   memantine 5 MG tablet Commonly known as: NAMENDA   Vitamin D3 1.25 MG (50000 UT) Caps       TAKE these medications    dorzolamide-timolol 22.3-6.8 MG/ML ophthalmic solution Commonly known as: COSOPT Place 1 drop into both eyes 2 (two) times daily.   nutrition supplement (JUVEN) Pack Take 1 packet by mouth 2 (two) times daily between meals.   silver sulfADIAZINE 1 % cream Commonly known as: SILVADENE Apply 1 application topically daily. Apply to affected area daily plus dry dressing What changed: additional instructions       No Known  Allergies  Follow-up Information     Newt Minion, MD Follow up in 1 week(s).   Specialty: Orthopedic Surgery Contact information: [address] [place] [address] [phone]                  The results of significant diagnostics from this hospitalization (including imaging, microbiology, ancillary and laboratory) are listed below for reference.    Significant Diagnostic Studies: CT HEAD WO CONTRAST  Result Date: 02/20/2021 CLINICAL DATA:  Head trauma EXAM: CT HEAD WITHOUT CONTRAST TECHNIQUE: Contiguous axial images were obtained from the base of the skull through the vertex without intravenous contrast. COMPARISON:  None. FINDINGS: Brain: No acute intracranial hemorrhage, mass effect, or herniation. No extra-axial fluid collections. No evidence of acute territorial infarct. No hydrocephalus. Mild cortical volume loss. Patchy hypodensities in the periventricular and subcortical white matter, likely secondary to chronic microvascular ischemic changes. Vascular: No hyperdense vessel or unexpected calcification. Skull: Normal. Negative for fracture or focal lesion. Sinuses/Orbits: No acute process identified. Surgical changes in the globes. Other: None. IMPRESSION: Chronic changes with no acute  intracranial process identified. Electronically Signed   By: Jannifer Hick M.D.   On: 02/20/2021 10:59   DG Pelvis Portable  Result Date: 02/20/2021 CLINICAL DATA:  Trauma, fall EXAM: PORTABLE PELVIS 1-2 VIEWS COMPARISON:  None. FINDINGS: No displaced fracture or dislocation is seen. There is a faint linear lucency in the lateral aspect of greater trochanter in the left femur. Degenerative changes are noted in the lower lumbar spine. IMPRESSION: No displaced fracture or dislocation is seen. There is linear lucency seen in the greater trochanter of the proximal left femur which may be an artifact due to confluence of soft tissues over the bony margin or suggest undisplaced fracture.  Please correlate with clinical symptoms and physical examination findings. If there are focal symptoms in the area of greater trochanter of proximal left femur, repeat radiographic examination and CT if warranted should be considered. Electronically Signed   By: Ernie Avena M.D.   On: 02/20/2021 10:46   CT HIP LEFT WO CONTRAST  Result Date: 02/20/2021 CLINICAL DATA:  Larey Seat. EXAM: CT OF THE LEFT HIP WITHOUT CONTRAST, CT OF THE RIGHT HIP WITHOUT CONTRAST TECHNIQUE: Multidetector CT imaging of BOTH HIPS was performed according to the standard protocol. Multiplanar CT image reconstructions were also generated. COMPARISON:  Radiographs 02/20/2021 FINDINGS: Both hips are normally located. Mild bilateral hip joint degenerative changes given the patient's age. No acute hip fracture or evidence of AVN. The bony pelvis is intact. No pelvic fractures are identified. Moderate degenerative changes at the pubic symphysis. The SI joints are intact. The hip and pelvic musculature are grossly normal by CT. No obvious muscle tear or intramuscular hematoma. Mom no inguinal mass or hernia. No significant intrapelvic abnormalities. IMPRESSION: 1. Mild bilateral hip joint degenerative changes. 2. No acute hip fracture or evidence of AVN. 3. No pelvic fractures are identified. Electronically Signed   By: Rudie Meyer M.D.   On: 02/20/2021 13:57   CT HIP RIGHT WO CONTRAST  Result Date: 02/20/2021 CLINICAL DATA:  Larey Seat. EXAM: CT OF THE LEFT HIP WITHOUT CONTRAST, CT OF THE RIGHT HIP WITHOUT CONTRAST TECHNIQUE: Multidetector CT imaging of BOTH HIPS was performed according to the standard protocol. Multiplanar CT image reconstructions were also generated. COMPARISON:  Radiographs 02/20/2021 FINDINGS: Both hips are normally located. Mild bilateral hip joint degenerative changes given the patient's age. No acute hip fracture or evidence of AVN. The bony pelvis is intact. No pelvic fractures are identified. Moderate degenerative  changes at the pubic symphysis. The SI joints are intact. The hip and pelvic musculature are grossly normal by CT. No obvious muscle tear or intramuscular hematoma. Mom no inguinal mass or hernia. No significant intrapelvic abnormalities. IMPRESSION: 1. Mild bilateral hip joint degenerative changes. 2. No acute hip fracture or evidence of AVN. 3. No pelvic fractures are identified. Electronically Signed   By: Rudie Meyer M.D.   On: 02/20/2021 13:57   DG Chest Port 1 View  Result Date: 02/20/2021 CLINICAL DATA:  Trauma, sepsis EXAM: PORTABLE CHEST 1 VIEW COMPARISON:  None FINDINGS: Transverse diameter of heart is increased. Central pulmonary vessels are prominent. There are no signs of alveolar pulmonary edema. Small bilateral pleural effusions are seen, more so on the left side. Evaluation of lower lung fields for infiltrates is limited by pleural effusions. There is no pneumothorax. Pacemaker battery is seen in the left infraclavicular region with tips of leads in the right atrium and right ventricle. IMPRESSION: Cardiomegaly. Small bilateral pleural effusions, more so on the left side. There are  no signs of alveolar pulmonary edema. Evaluation of lower lung fields for infiltrates is limited by the effusions. Electronically Signed   By: Elmer Picker M.D.   On: 02/20/2021 10:42   DG Foot Complete Left  Result Date: 02/06/2021 CLINICAL DATA:  Left foot swelling with redness and drainage after insect sting 2 weeks ago EXAM: LEFT FOOT - COMPLETE 3+ VIEW COMPARISON:  None. FINDINGS: There is no evidence of fracture or dislocation. No erosion or periosteal elevation. Degenerative changes are most pronounced within the interphalangeal joints of the second and third toes. Prominent soft tissue swelling about the forefoot. No soft tissue gas. IMPRESSION: 1. No radiographic evidence to suggest osteomyelitis. 2. Prominent soft tissue swelling about the forefoot. No soft tissue gas. Electronically Signed    By: Davina Poke D.O.   On: 02/06/2021 16:47   VAS [place] ABI WITH/WO TBI  Result Date: 02/22/2021  LOWER EXTREMITY DOPPLER STUDY Patient Name:  [name] [name]  Date of Exam:   02/21/2021 Medical Rec #: [id number]       Accession #:    [id number] Date of Birth: [birth year]       Patient Gender: F Patient Age:   27 years Exam [facility]  [facility] Procedure:      VAS [place] ABI WITH/WO TBI Referring Phys: Wynetta Fines --------------------------------------------------------------------------------  Indications: Non-healing wound on dorsum of LT foot. High Risk Factors: Hypertension, hyperlipidemia.  Limitations: Today's exam was limited due to an open wound and bandages. Comparison Study: No prior studies. Performing Technologist: Darlin Coco RDMS RVT  Examination Guidelines: A complete evaluation includes at minimum, Doppler waveform signals and systolic blood pressure reading at the level of bilateral brachial, anterior tibial, and posterior tibial arteries, when vessel segments are accessible. Bilateral testing is considered an integral part of a complete examination. Photoelectric Plethysmograph (PPG) waveforms and toe systolic pressure readings are included as required and additional duplex testing as needed. Limited examinations for reoccurring indications may be performed as noted.  ABI Findings: +---------+------------------+-----+----------+--------+ Right    Rt Pressure (mmHg)IndexWaveform  Comment  +---------+------------------+-----+----------+--------+ Brachial 105                                       +---------+------------------+-----+----------+--------+ PTA      123               1.16 monophasic         +---------+------------------+-----+----------+--------+ DP       121               1.14 biphasic           +---------+------------------+-----+----------+--------+ Great Toe66                0.62 Abnormal            +---------+------------------+-----+----------+--------+ +---------+------------------+-----+--------+-------+ Left     Lt Pressure (mmHg)IndexWaveformComment +---------+------------------+-----+--------+-------+ Brachial 106                                    +---------+------------------+-----+--------+-------+ PTA      154               1.45 biphasic        +---------+------------------+-----+--------+-------+ DP       106               1.00 biphasic        +---------+------------------+-----+--------+-------+  Great Toe78                0.74 Normal          +---------+------------------+-----+--------+-------+ +-------+-----------+-----------+------------+------------+ ABI/TBIToday's ABIToday's TBIPrevious ABIPrevious TBI +-------+-----------+-----------+------------+------------+ Right  1.16       0.64                                +-------+-----------+-----------+------------+------------+ Left   1.45       0.74                                +-------+-----------+-----------+------------+------------+ Arterial wall calcification precludes accurate ankle pressures and ABIs.  Summary: Right: Resting right ankle-brachial index is within normal range. No evidence of significant right lower extremity arterial disease. The right toe-brachial index is abnormal. Although ankle brachial indices are within normal limits (0.95-1.29), arterial Doppler waveforms at the ankle suggest some component of arterial occlusive disease.  Left: Resting left ankle-brachial index indicates noncompressible left lower extremity arteries. The left toe-brachial index is normal.  *See table(s) above for measurements and observations.  Electronically signed by Harold Barban MD on 02/22/2021 at 12:51:16 AM.    Final    ECHOCARDIOGRAM COMPLETE  Result Date: 02/21/2021    ECHOCARDIOGRAM REPORT   Patient Name:   [name] [name] Date of Exam: 02/21/2021 Medical Rec #:  [id number]      Height:        61.0 in Accession #:    [id number]     Weight:       96.1 lb Date of Birth:  Jun 13, 1924      BSA:          1.383 m Patient Age:    89 years       BP:           95/61 mmHg Patient Gender: F              HR:           61 bpm. Exam Location:  Inpatient Procedure: 2D Echo, 3D Echo, Cardiac Doppler, Color Doppler and Intracardiac            Opacification Agent  Results communicated to Dr Nevada Crane at 13:13 on 02/21/21. Indications:    I50.40* Unspecified combined systolic (congestive) and diastolic                 (congestive) heart failure  History:        Patient has no prior history of Echocardiogram examinations.                 Pacemaker and Abnormal ECG, Arrythmias:Atrial Fibrillation,                 Signs/Symptoms:Altered Mental Status and Alzheimer's; Risk                 Factors:Hypertension and Dyslipidemia.  Sonographer:    Roseanna Rainbow RDCS Referring Phys: [id number] [place]  1. Left ventricular ejection fraction, by estimation, is 25 to 30%. The left ventricle has severely decreased function. The left ventricle has no regional wall motion abnormalities. The left ventricular internal cavity size was mildly dilated. There is mild left ventricular hypertrophy. Left ventricular diastolic parameters are indeterminate.  2. Right ventricular systolic function is mildly reduced. The right ventricular size is moderately enlarged. There is mildly elevated pulmonary artery systolic pressure. The  estimated right ventricular systolic pressure is Q000111Q mmHg.  3. Small mobil echodensity adherent to pacemaker lead in right atrium (image 30), could represent thrombus or vegetation, would recommend checking blood cultures  4. Left atrial size was severely dilated.  5. Right atrial size was severely dilated.  6. The mitral valve is abnormal. Severe mitral valve regurgitation. Appears functional. No evidence of mitral stenosis.  7. Tricuspid valve regurgitation is severe. Hepatic vein systolic flow reversal.  8. The  aortic valve is tricuspid. Aortic valve regurgitation is not visualized. Aortic valve sclerosis is present, with no evidence of aortic valve stenosis.  9. The inferior vena cava is dilated in size with <50% respiratory variability, suggesting right atrial pressure of 15 mmHg. FINDINGS  Left Ventricle: Left ventricular ejection fraction, by estimation, is 25 to 30%. The left ventricle has severely decreased function. The left ventricle has no regional wall motion abnormalities. The left ventricular internal cavity size was mildly dilated. There is mild left ventricular hypertrophy. Left ventricular diastolic parameters are indeterminate. Right Ventricle: The right ventricular size is moderately enlarged. Right vetricular wall thickness was not well visualized. Right ventricular systolic function is mildly reduced. There is mildly elevated pulmonary artery systolic pressure. The tricuspid  regurgitant velocity is 2.50 m/s, and with an assumed right atrial pressure of 15 mmHg, the estimated right ventricular systolic pressure is Q000111Q mmHg. Left Atrium: Left atrial size was severely dilated. Right Atrium: Right atrial size was severely dilated. Pericardium: There is no evidence of pericardial effusion. Mitral Valve: The mitral valve is abnormal. Severe mitral valve regurgitation. No evidence of mitral valve stenosis. Tricuspid Valve: The tricuspid valve is normal in structure. Tricuspid valve regurgitation is severe. Aortic Valve: The aortic valve is tricuspid. Aortic valve regurgitation is not visualized. Aortic valve sclerosis is present, with no evidence of aortic valve stenosis. Pulmonic Valve: The pulmonic valve was not well visualized. Pulmonic valve regurgitation is mild. Aorta: The aortic root and ascending aorta are structurally normal, with no evidence of dilitation. Venous: The inferior vena cava is dilated in size with less than 50% respiratory variability, suggesting right atrial pressure of 15 mmHg.  IAS/Shunts: The interatrial septum was not well visualized. Additional Comments: There is a small pleural effusion in the left lateral region.  LEFT VENTRICLE PLAX 2D LVIDd:         5.10 cm      Diastology LVIDs:         4.60 cm      LV e' medial:    3.75 cm/s LV PW:         0.90 cm      LV E/e' medial:  29.9 LV IVS:        1.00 cm      LV e' lateral:   4.12 cm/s LVOT diam:     1.80 cm      LV E/e' lateral: 27.2 LV SV:         37 LV SV Index:   27 LVOT Area:     2.54 cm                              3D Volume EF: LV Volumes (MOD)            3D EF:        28 % LV vol d, MOD A2C: 134.0 ml LV EDV:       143 ml LV vol d, MOD A4C:  110.0 ml LV ESV:       103 ml LV vol s, MOD A2C: 100.0 ml LV SV:        40 ml LV vol s, MOD A4C: 90.0 ml LV SV MOD A2C:     34.0 ml LV SV MOD A4C:     110.0 ml LV SV MOD BP:      28.0 ml RIGHT VENTRICLE            IVC RV S prime:     8.74 cm/s  IVC diam: 2.30 cm TAPSE (M-mode): 1.6 cm LEFT ATRIUM             Index        RIGHT ATRIUM           Index LA diam:        4.40 cm 3.18 cm/m   RA Area:     26.80 cm LA Vol (A2C):   99.1 ml 71.64 ml/m  RA Volume:   83.90 ml  60.65 ml/m LA Vol (A4C):   77.7 ml 56.17 ml/m LA Biplane Vol: 87.4 ml 63.18 ml/m  AORTIC VALVE             PULMONIC VALVE LVOT Vmax:   87.80 cm/s  PR End Diast Vel: 2.55 msec LVOT Vmean:  51.100 cm/s LVOT VTI:    0.145 m  AORTA Ao Root diam: 3.00 cm Ao Asc diam:  3.10 cm MITRAL VALVE                  TRICUSPID VALVE MV Area (PHT): 4.04 cm       TR Peak grad:   25.0 mmHg MV Decel Time: 188 msec       TR Vmax:        250.00 cm/s MR Peak grad:    63.0 mmHg MR Mean grad:    38.0 mmHg    SHUNTS MR Vmax:         397.00 cm/s  Systemic VTI:  0.14 m MR Vmean:        283.0 cm/s   Systemic Diam: 1.80 cm MR PISA:         1.90 cm MR PISA Eff ROA: 15 mm MR PISA Radius:  0.55 cm MV E velocity: 112.00 cm/s MV A velocity: 36.20 cm/s MV E/A ratio:  3.09 Oswaldo Milian MD Electronically signed by Oswaldo Milian MD Signature  Date/Time: 02/21/2021/1:14:28 PM    Final    CUP PACEART REMOTE DEVICE CHECK  Result Date: 02/07/2021 Scheduled remote reviewed. Normal device function.  1 NSVT, slightly irregular, RVR vs NSVT.  Onset not available, abrupt termination. Known PAF, Eliquis Next remote 91 days. LR   Microbiology: Recent Results (from the past 240 hour(s))  Culture, blood (Routine x 2)     Status: None   Collection Time: 02/20/21 10:25 AM   Specimen: BLOOD  Result Value Ref Range Status   Specimen Description BLOOD LEFT ANTECUBITAL  Final   Special Requests   Final    BOTTLES DRAWN AEROBIC AND ANAEROBIC Blood Culture adequate volume   Culture   Final    NO GROWTH 5 DAYS Performed at [facility], [address], [place], [place] [address]    Report Status 02/25/2021 FINAL  Final  Culture, blood (Routine x 2)     Status: None   Collection Time: 02/20/21 10:36 AM   Specimen: BLOOD  Result Value Ref Range Status   Specimen Description BLOOD RIGHT ANTECUBITAL  Final   Special Requests   Final    BOTTLES DRAWN AEROBIC AND ANAEROBIC Blood Culture results may not be optimal due to an inadequate volume of blood received in culture bottles   Culture   Final    NO GROWTH 5 DAYS Performed at [facility], [place] [address], [place], [place] [address]    Report Status 02/25/2021 FINAL  Final  Resp Panel by RT-PCR (Flu A&B, Covid) Nasopharyngeal Swab     Status: None   Collection Time: 02/20/21  3:34 PM   Specimen: Nasopharyngeal Swab; Nasopharyngeal(NP) swabs in vial transport medium  Result Value Ref Range Status   SARS Coronavirus 2 by RT PCR NEGATIVE NEGATIVE Final    Comment: (NOTE) SARS-CoV-2 target nucleic acids are NOT DETECTED.  The SARS-CoV-2 RNA is generally detectable in upper respiratory specimens during the acute phase of infection. The lowest concentration of SARS-CoV-2 viral copies this assay can detect is 138 copies/mL. A negative result does not preclude SARS-Cov-2 infection and  should not be used as the sole basis for treatment or other patient management decisions. A negative result may occur with  improper specimen collection/handling, submission of specimen other than nasopharyngeal swab, presence of viral mutation(s) within the areas targeted by this assay, and inadequate number of viral copies(<138 copies/mL). A negative result must be combined with clinical observations, patient history, and epidemiological information. The expected result is Negative.  Fact Sheet for Patients:  EntrepreneurPulse.com.au  Fact Sheet for Healthcare Providers:  IncredibleEmployment.be  This test is no t yet approved or cleared by the [place] FDA and  has been authorized for detection and/or diagnosis of SARS-CoV-2 by FDA under an Emergency Use Authorization (EUA). This EUA will remain  in effect (meaning this test can be used) for the duration of the COVID-19 declaration under Section 564(b)(1) of the Act, 21 U.S.C.section 360bbb-3(b)(1), unless the authorization is terminated  or revoked sooner.       Influenza A by PCR NEGATIVE NEGATIVE Final   Influenza B by PCR NEGATIVE NEGATIVE Final    Comment: (NOTE) The Xpert Xpress SARS-CoV-2/FLU/RSV plus assay is intended as an aid in the diagnosis of influenza from Nasopharyngeal swab specimens and should not be used as a sole basis for treatment. Nasal washings and aspirates are unacceptable for Xpert Xpress SARS-CoV-2/FLU/RSV testing.  Fact Sheet for Patients: EntrepreneurPulse.com.au  Fact Sheet for Healthcare Providers: IncredibleEmployment.be  This test is not yet approved or cleared by the [place] FDA and has been authorized for detection and/or diagnosis of SARS-CoV-2 by FDA under an Emergency Use Authorization (EUA). This EUA will remain in effect (meaning this test can be used) for the duration of the COVID-19 declaration  under Section 564(b)(1) of the Act, 21 U.S.C. section 360bbb-3(b)(1), unless the authorization is terminated or revoked.  Performed at [facility], [place] [address], [place], [place] [address]   MRSA Next Gen by PCR, Nasal     Status: None   Collection Time: 02/22/21  4:59 PM   Specimen: Nasal Mucosa; Nasal Swab  Result Value Ref Range Status   MRSA by PCR Next Gen NOT DETECTED NOT DETECTED Final    Comment: (NOTE) The GeneXpert MRSA Assay (FDA approved for NASAL specimens only), is one component of a comprehensive MRSA colonization surveillance program. It is not intended to diagnose MRSA infection nor to guide or monitor treatment for MRSA infections. Test performance is not FDA approved in patients less than 63 years old. Performed at [facility], [place] [address] [place],  [address]      Labs: Basic Metabolic Panel: Recent Labs  Lab 02/21/21 0324 02/22/21 0225 02/23/21 0346 02/24/21 0329 02/25/21 0307  NA 137 137 138 138 138  K 3.7 3.7 4.0 3.8 4.7  CL 107 109 110 109 110  CO2 18* 19* 22 23 22   GLUCOSE 66* 104* 92 95 114*  BUN 33* 46* 45* 48* 42*  CREATININE 1.12* 0.89 0.68 0.57 0.62  CALCIUM 9.6 9.8 9.3 9.1 9.6   Liver Function Tests: Recent Labs  Lab 02/22/21 0225 02/23/21 0346 02/24/21 0329 02/25/21 0307 02/26/21 0322  AST 55* 40 38 39 36  ALT 36 33 32 35 36  ALKPHOS 73 63 68 74 80  BILITOT 1.2 1.3* 1.3* 1.1 1.2  PROT 5.3* 4.8* 4.5* 4.9* 5.2*  ALBUMIN 3.4* 2.9* 2.7* 2.8* 2.8*   No results for input(s): LIPASE, AMYLASE in the last 168 hours. No results for input(s): AMMONIA in the last 168 hours. CBC: Recent Labs  Lab 02/20/21 0947 02/20/21 1021 02/23/21 0346 02/23/21 1859 02/24/21 0329 02/24/21 1320 02/25/21 0307  WBC 15.9*   < > 7.1 10.1 5.7 7.7 7.6  NEUTROABS 14.3*  --   --   --   --   --   --   HGB 17.1*   < > 11.9* 12.7 10.0* 11.2* 10.9*  HCT 50.9*   < > 35.1* 37.7 29.6* 32.7* 32.6*  MCV 100.8*   < > 98.6 101.1* 97.4  99.4 100.0  PLT 106*   < > 71* 80* 60* 64* 63*   < > = values in this interval not displayed.   Cardiac Enzymes: Recent Labs  Lab 02/20/21 0947 02/21/21 0324 02/23/21 0912  CKTOTAL 977* 481* 65   BNP: BNP (last 3 results) No results for input(s): BNP in the last 8760 hours.  ProBNP (last 3 results) No results for input(s): PROBNP in the last 8760 hours.  CBG: No results for input(s): GLUCAP in the last 168 hours.     Signed:  Kayleen Memos, MD [facility] 02/26/2021, 1:05 PM

## 2021-02-26 NOTE — TOC Transition Note (Incomplete Revision)
Transition of Care [facility]) - CM/SW Discharge Note   Patient Details  Name: [name] [name] MRN: [id number] Date of Birth: 05/08/1924  [facility] CM/SW Contact:  Lynett Grimes Phone Number: 02/26/2021, 12:45 PM   Clinical Narrative:    Patient will DC to: [facility] Anticipated DC date: 02/26/2021 Family notified: Pt daughter Transport by: Sharin Mons   Per MD patient ready for DC to [facility]. RN to call report prior to discharge [phone]). RN, patient, patient's family, and facility notified of DC. Discharge Summary and FL2 sent to facility. DC packet on chart. Ambulance transport requested for patient.   CSW will sign off for now as social work intervention is no longer needed. Please consult [place] again if new needs arise.       Barriers to Discharge: Continued Medical Work up   Patient Goals and CMS Choice  SNF vs. Hospice       Discharge Placement  [facility]                     Discharge Plan and Services In-house Referral: Clinical Social Work Discharge Planning Services: CM Consult Post Acute Care Choice: Home Health                               Social Determinants of Health (SDOH) Interventions     Readmission Risk Interventions No flowsheet data found.

## 2021-02-26 NOTE — Progress Notes (Signed)
MD and family notified of pt's covid results as positive, pt unable to discharge at this time, hospice home notified of the results.

## 2021-02-26 NOTE — TOC Progression Note (Signed)
Transition of Care [facility]) - Progression Note    Patient Details  Name: [name] [name] MRN: [id number] Date of Birth: 02-Aug-1924  Transition of Care [facility]) CM/SW Contact  Ivette Loyal, [place] Phone Number: 02/26/2021, 3:59 PM  Clinical Narrative:    Pt tested positive for covid, Hospice in [facility] will not accept pt with a positive covid for 10 days after positive test. CSW contacted pt daughter to inform her of the changes. Pt daughter was understanding and pleasant, CSW will follow up on pt DC plan.    Expected Discharge Plan: Home w Home Health Services Barriers to Discharge: Continued Medical Work up  Expected Discharge Plan and Services Expected Discharge Plan: Home w Home Health Services In-house Referral: Clinical Social Work Discharge Planning Services: CM Consult Post Acute Care Choice: Home Health Living arrangements for the past 2 months: Single Family Home Expected Discharge Date: 02/26/21                                     Social Determinants of Health (SDOH) Interventions    Readmission Risk Interventions No flowsheet data found.

## 2021-02-26 NOTE — Progress Notes (Signed)
Report called to RN in Hospice home, family at bedside aware. Waiting on transport.

## 2021-02-26 NOTE — Consult Note (Signed)
   [facility] CM Inpatient Consult   02/26/2021  Tanaja Ganger Shadduck Dec 21, 1924 [id number]  [facility] [[facility]]  Accountable Care Organization [ACO] Patient: [name] [name] [facility]  Primary Care Provider:  Raliegh Ip, DO with [facility],  is an embedded provider with a Chronic Care Management team and program, and is listed for the transition of care follow up and appointments.  Patient was screened for Embedded practice service needs for chronic care management.  Reviews from progress notes shows patient being recommended for a hospice facility for post hospital needs. Awaiting test results.  Plan: To continue to follow with the [facility] team regarding current planned transition.  Please contact for further questions,  Charlesetta Shanks, RN BSN CCM [facility]  [phone] business mobile phone Toll free office [phone]  Fax number: [phone] [email or web address]

## 2021-02-26 NOTE — Progress Notes (Signed)
Subjective:  Patient without complaints   Antibiotics:  Anti-infectives (From admission, onward)    Start     Dose/Rate Route Frequency Ordered Stop   02/23/21 1215  ceFEPIme (MAXIPIME) 2 g in sodium chloride 0.9 % 100 mL IVPB        2 g 200 mL/hr over 30 Minutes Intravenous Every 12 hours 02/23/21 1128     02/22/21 2200  ceFEPIme (MAXIPIME) 2 g in sodium chloride 0.9 % 100 mL IVPB  Status:  Discontinued        2 g 200 mL/hr over 30 Minutes Intravenous Every 12 hours 02/22/21 1158 02/22/21 1159   02/22/21 1300  vancomycin (VANCOREADY) IVPB 750 mg/150 mL  Status:  Discontinued        750 mg 150 mL/hr over 60 Minutes Intravenous Every 48 hours 02/20/21 1233 02/23/21 1602   02/22/21 1245  ceFEPIme (MAXIPIME) 2 g in sodium chloride 0.9 % 100 mL IVPB  Status:  Discontinued        2 g 200 mL/hr over 30 Minutes Intravenous Every 24 hours 02/22/21 1159 02/23/21 1128   02/22/21 0900  ceFEPIme (MAXIPIME) 2 g in sodium chloride 0.9 % 100 mL IVPB  Status:  Discontinued        2 g 200 mL/hr over 30 Minutes Intravenous Every 24 hours 02/21/21 1757 02/22/21 1158   02/20/21 2000  ceFEPIme (MAXIPIME) 2 g in sodium chloride 0.9 % 100 mL IVPB  Status:  Discontinued        2 g 200 mL/hr over 30 Minutes Intravenous Every 12 hours 02/20/21 1336 02/21/21 1757   02/20/21 1200  piperacillin-tazobactam (ZOSYN) IVPB 3.375 g        3.375 g 100 mL/hr over 30 Minutes Intravenous  Once 02/20/21 1154 02/20/21 1342   02/20/21 1200  vancomycin (VANCOREADY) IVPB 1000 mg/200 mL        1,000 mg 200 mL/hr over 60 Minutes Intravenous  Once 02/20/21 1154 02/20/21 1358       Medications: Scheduled Meds:  empagliflozin  10 mg Oral Daily   levothyroxine  75 mcg Oral QAC breakfast   memantine  5 mg Oral QHS   multivitamin with minerals  1 tablet Oral Daily   nutrition supplement (JUVEN)  1 packet Oral BID BM   silver sulfADIAZINE   Topical Daily   Continuous Infusions:  sodium chloride Stopped  (02/21/21 0947)   ceFEPime (MAXIPIME) IV 2 g (02/26/21 1145)   PRN Meds:.sodium chloride, acetaminophen **OR** acetaminophen, feeding supplement, HYDROmorphone (DILAUDID) injection, ondansetron **OR** ondansetron (ZOFRAN) IV    Objective: Weight change: -1.2 kg  Intake/Output Summary (Last 24 hours) at 02/26/2021 1238 Last data filed at 02/26/2021 [id number] Gross per 24 hour  Intake 440 ml  Output 250 ml  Net 190 ml    Blood pressure (!) 103/58, pulse 61, temperature (!) 97.4 F (36.3 C), temperature source Oral, resp. rate 17, height 5\' 1"  (1.549 m), weight 43.7 kg, SpO2 97 %. Temp:  [97.4 F (36.3 C)-97.5 F (36.4 C)] 97.4 F (36.3 C) (11/28 0419) Pulse Rate:  [58-61] 61 (11/28 0419) Resp:  [16-17] 17 (11/28 0419) BP: (103)/(58-64) 103/58 (11/28 0419) SpO2:  [97 %] 97 % (11/27 2046) Weight:  [43.7 kg] 43.7 kg (11/28 0419)  Physical Exam: Physical Exam HENT:     Head: Normocephalic and atraumatic.  Cardiovascular:     Rate and Rhythm: Tachycardia present.  Pulmonary:     Effort: Pulmonary effort is normal. No  respiratory distress.     Breath sounds: No wheezing.  Abdominal:     General: Bowel sounds are normal. There is no distension.  Neurological:     General: No focal deficit present.     Mental Status: She is alert. She is disoriented.  Psychiatric:        Attention and Perception: She is inattentive.        Speech: Speech is delayed.        Behavior: Behavior is slowed. Behavior is cooperative.        Cognition and Memory: Cognition is impaired. Memory is impaired. She exhibits impaired recent memory and impaired remote memory.    Left lower extremity bandaged  CBC:    BMET Recent Labs    02/24/21 0329 02/25/21 0307  NA 138 138  K 3.8 4.7  CL 109 110  CO2 23 22  GLUCOSE 95 114*  BUN 48* 42*  CREATININE 0.57 0.62  CALCIUM 9.1 9.6      Liver Panel  Recent Labs    02/25/21 0307 02/26/21 0322  PROT 4.9* 5.2*  ALBUMIN 2.8* 2.8*  AST 39 36   ALT 35 36  ALKPHOS 74 80  BILITOT 1.1 1.2  BILIDIR 0.4* 0.3*  IBILI 0.7 0.9        Sedimentation Rate No results for input(s): ESRSEDRATE in the last 72 hours. C-Reactive Protein No results for input(s): CRP in the last 72 hours.  Micro Results: Recent Results (from the past 720 hour(s))  Body fluid culture w Gram Stain     Status: None   Collection Time: 02/06/21  7:05 PM   Specimen: Foot; Body Fluid  Result Value Ref Range Status   Specimen Description   Final    FOOT Performed at [facility], [address] [place], [place] [address]    Special Requests   Final    NONE Performed at [facility], [address], [place], [place] [address]    Gram Stain   Final    ABUNDANT WBC PRESENT, PREDOMINANTLY PMN NO ORGANISMS SEEN Performed at [facility], [address], [place], [place] [address]    Culture   Final    RARE LECLERCIA ADECARBOXYLATA RARE PSEUDOMONAS PUTIDA    Report Status 02/10/2021 FINAL  Final   Organism ID, Bacteria LECLERCIA ADECARBOXYLATA  Final   Organism ID, Bacteria PSEUDOMONAS PUTIDA  Final      Susceptibility   Leclercia adecarboxylata - MIC*    AMPICILLIN <=2 SENSITIVE Sensitive     CEFAZOLIN <=4 SENSITIVE Sensitive     CEFEPIME <=0.12 SENSITIVE Sensitive     CEFTAZIDIME <=1 SENSITIVE Sensitive     CEFTRIAXONE <=0.25 SENSITIVE Sensitive     CIPROFLOXACIN <=0.25 SENSITIVE Sensitive     GENTAMICIN <=1 SENSITIVE Sensitive     IMIPENEM <=0.25 SENSITIVE Sensitive     TRIMETH/SULFA <=20 SENSITIVE Sensitive     AMPICILLIN/SULBACTAM <=2 SENSITIVE Sensitive     PIP/TAZO <=4 SENSITIVE Sensitive     * RARE LECLERCIA ADECARBOXYLATA   Pseudomonas putida - MIC*    CEFTAZIDIME 2 SENSITIVE Sensitive     CIPROFLOXACIN <=0.25 SENSITIVE Sensitive     GENTAMICIN <=1 SENSITIVE Sensitive     IMIPENEM 2 SENSITIVE Sensitive     PIP/TAZO 8 SENSITIVE Sensitive     * RARE PSEUDOMONAS PUTIDA  Gram stain     Status: None   Collection Time: 02/06/21   7:05 PM   Specimen: Foot; Abscess  Result Value Ref Range Status  Specimen Description FOOT  Final   Special Requests NONE  Final   Gram Stain   Final    WBC PRESENT,BOTH PMN AND MONONUCLEAR NO ORGANISMS SEEN CYTOSPIN SMEAR Performed at [facility], [address] [place],  [address]    Report Status 02/06/2021 FINAL  Final  Anaerobic and Aerobic Culture     Status: Abnormal   Collection Time: 02/14/21  4:46 PM   Specimen: Foot; Other   FO  Result Value Ref Range Status   Anaerobic Culture Final report  Final   Result 1 Comment  Final    Comment: No anaerobic growth in 72 hours.   Aerobic Culture Final report (A)  Final   Result 1 Pseudomonas putida (A)  Final    Comment: Heavy growth   Result 2 Mixed skin flora  Final    Comment: Light growth   Antimicrobial Susceptibility Comment  Final    Comment:       ** S = Susceptible; I = Intermediate; R = Resistant **                    P = Positive; N = Negative             MICS are expressed in micrograms per mL    Antibiotic                 RSLT#1    RSLT#2    RSLT#3    RSLT#4 Amikacin                       S Aztreonam                      I Cefepime                       S Cefotaxime                     S Ceftazidime                    S Ceftriaxone                    S Ciprofloxacin                  S Gentamicin                     S Imipenem                       S Levofloxacin                   S Meropenem                      S Piperacillin/Tazobactam        S Ticarcillin/Clavulanate        I Tobramycin                     S Trimethoprim/Sulfa             R   Wound culture     Status: Abnormal   Collection Time: 02/15/21  3:45 PM   Specimen: Wound  Result Value Ref Range Status   MICRO NUMBER: [id number]  Final   SPECIMEN QUALITY: Adequate  Final   SOURCE: FOOT, LEFT  Final  STATUS: FINAL  Final   GRAM STAIN:   Final    Few White blood cells seen No epithelial cells seen Moderate Gram negative bacilli    ISOLATE 1: Pseudomonas putida (A)  Final    Comment: Heavy growth of Pseudomonas putida      Susceptibility   Pseudomonas putida - AEROBIC CULT, GRAM STAIN NEGATIVE 1    CEFTAZIDIME 2 Sensitive     CEFEPIME <=1 Sensitive     CIPROFLOXACIN <=0.25 Sensitive     LEVOFLOXACIN 1 Sensitive     GENTAMICIN <=1 Sensitive     PIP/TAZO 8 Sensitive     TOBRAMYCIN* <=1 Sensitive      * Legend: S = Susceptible  I = Intermediate R = Resistant  NS = Not susceptible * = Not tested  NR = Not reported **NN = See antimicrobic comments   Culture, blood (Routine x 2)     Status: None   Collection Time: 02/20/21 10:25 AM   Specimen: BLOOD  Result Value Ref Range Status   Specimen Description BLOOD LEFT ANTECUBITAL  Final   Special Requests   Final    BOTTLES DRAWN AEROBIC AND ANAEROBIC Blood Culture adequate volume   Culture   Final    NO GROWTH 5 DAYS Performed at [facility], [address], [place], [place] [address]    Report Status 02/25/2021 FINAL  Final  Culture, blood (Routine x 2)     Status: None   Collection Time: 02/20/21 10:36 AM   Specimen: BLOOD  Result Value Ref Range Status   Specimen Description BLOOD RIGHT ANTECUBITAL  Final   Special Requests   Final    BOTTLES DRAWN AEROBIC AND ANAEROBIC Blood Culture results may not be optimal due to an inadequate volume of blood received in culture bottles   Culture   Final    NO GROWTH 5 DAYS Performed at [facility], [place] [address] [place], [place] [address]    Report Status 02/25/2021 FINAL  Final  Resp Panel by RT-PCR (Flu A&B, Covid) Nasopharyngeal Swab     Status: None   Collection Time: 02/20/21  3:34 PM   Specimen: Nasopharyngeal Swab; Nasopharyngeal(NP) swabs in vial transport medium  Result Value Ref Range Status   SARS Coronavirus 2 by RT PCR NEGATIVE NEGATIVE Final    Comment: (NOTE) SARS-CoV-2 target nucleic acids are NOT DETECTED.  The SARS-CoV-2 RNA is generally detectable in upper respiratory specimens  during the acute phase of infection. The lowest concentration of SARS-CoV-2 viral copies this assay can detect is 138 copies/mL. A negative result does not preclude SARS-Cov-2 infection and should not be used as the sole basis for treatment or other patient management decisions. A negative result may occur with  improper specimen collection/handling, submission of specimen other than nasopharyngeal swab, presence of viral mutation(s) within the areas targeted by this assay, and inadequate number of viral copies(<138 copies/mL). A negative result must be combined with clinical observations, patient history, and epidemiological information. The expected result is Negative.  Fact Sheet for Patients:  EntrepreneurPulse.com.au  Fact Sheet for Healthcare Providers:  IncredibleEmployment.be  This test is no t yet approved or cleared by the [place] FDA and  has been authorized for detection and/or diagnosis of SARS-CoV-2 by FDA under an Emergency Use Authorization (EUA). This EUA will remain  in effect (meaning this test can be used) for the duration of the COVID-19 declaration under Section 564(b)(1) of the Act, 21 U.S.C.section 360bbb-3(b)(1), unless the  authorization is terminated  or revoked sooner.       Influenza A by PCR NEGATIVE NEGATIVE Final   Influenza B by PCR NEGATIVE NEGATIVE Final    Comment: (NOTE) The Xpert Xpress SARS-CoV-2/FLU/RSV plus assay is intended as an aid in the diagnosis of influenza from Nasopharyngeal swab specimens and should not be used as a sole basis for treatment. Nasal washings and aspirates are unacceptable for Xpert Xpress SARS-CoV-2/FLU/RSV testing.  Fact Sheet for Patients: EntrepreneurPulse.com.au  Fact Sheet for Healthcare Providers: IncredibleEmployment.be  This test is not yet approved or cleared by the [place] FDA and has been authorized for detection  and/or diagnosis of SARS-CoV-2 by FDA under an Emergency Use Authorization (EUA). This EUA will remain in effect (meaning this test can be used) for the duration of the COVID-19 declaration under Section 564(b)(1) of the Act, 21 U.S.C. section 360bbb-3(b)(1), unless the authorization is terminated or revoked.  Performed at [facility], [place] [address] [place], [place] [address]   MRSA Next Gen by PCR, Nasal     Status: None   Collection Time: 02/22/21  4:59 PM   Specimen: Nasal Mucosa; Nasal Swab  Result Value Ref Range Status   MRSA by PCR Next Gen NOT DETECTED NOT DETECTED Final    Comment: (NOTE) The GeneXpert MRSA Assay (FDA approved for NASAL specimens only), is one component of a comprehensive MRSA colonization surveillance program. It is not intended to diagnose MRSA infection nor to guide or monitor treatment for MRSA infections. Test performance is not FDA approved in patients less than 9 years old. Performed at [facility], [place] [address], [place], [place] [address]     Studies/Results: No results found.    Assessment/Plan:  INTERVAL HISTORY: Patient seen by palliative care.  Patient's family desire for her to go to inpatient hospice and this being arranged for facility in [facility].    Principal Problem:   Pacemaker infection ([place]) Active Problems:   Sepsis ([place])   Cold extremities   Recurrent falls   Ulcer of left foot with fat layer exposed ([place])   Protein-calorie malnutrition, severe   Endocarditis, valve unspecified   Elevated creatine kinase   Elevated lactic acid level   Goals of care, counseling/discussion   Severe late onset Alzheimer's dementia without behavioral disturbance, psychotic disturbance, mood disturbance, or anxiety ([place])    [name] [name] is a 87 y.o. female with advanced dementia with complete heart block and pacemaker who had stepped on a yellowjacket nest treated with prednisone Zyrtec and then with  worsening of her wound which now encompasses the entire of her dorsal foot with exposed tendon.  She was admitted with severe sepsis responded to IV antibiotics and fluids.  Her blood cultures have not yielded any organism.  She was on broad-spectrum antibiotics with vancomycin and Zosyn and then vancomycin and cefepime with last dose of vancomycin on the 24th.  She grew a pseudomonal species from her wound from specimen taken in clinic.  Her 2D echocardiogram suggest a vegetation on her pacemaker.  I agree with cardiology electrophysiology that performing a TEE is not appropriate given her frailty.  Device extraction also is not a possibility.  #1  Pacemaker for infection:  Family have decided to not continue antibiotics at discharge and I think this is a reasonable and compassionate option I think antibiotic therapy carries a lot of risk and is not going to be of a great deal of benefit to her quality  of life.  #2 foot infection with exposed tendon:  Patient certainly at risk of deteriorating from this but I think again comfort measures all the correct route to go with her care    #3 goals of care: Son and daughter in agreement that best option for their mom is for her to be an inpatient hospice and they found a facility in [place] which is ideally suited based on their  location  I spent 36 minutes with the patient including  face to face counseling of the patient s surrogate her son regards to goals of care and specifics around treatment of device infection and foot infection, personally reviewing CBC BMP updated culture data 2D echocardiogram along with review of medical records in preparation for the visit and during the visit and in coordination of her care with Dr. Nevada Crane.   Alcide Evener 02/26/2021, 12:38 PM

## 2021-02-27 DIAGNOSIS — T827XXD Infection and inflammatory reaction due to other cardiac and vascular devices, implants and grafts, subsequent encounter: Secondary | ICD-10-CM | POA: Diagnosis not present

## 2021-02-27 DIAGNOSIS — A419 Sepsis, unspecified organism: Secondary | ICD-10-CM | POA: Diagnosis not present

## 2021-02-27 LAB — RESP PANEL BY RT-PCR (FLU A&B, COVID) ARPGX2
Influenza A by PCR: NEGATIVE
Influenza B by PCR: NEGATIVE
SARS Coronavirus 2 by RT PCR: NEGATIVE

## 2021-02-27 NOTE — Discharge Summary (Signed)
Discharge Summary  [name] [name] [id number] DOB: 1924-04-25  PCP: Janora Norlander, DO  Admit date: 02/20/2021 Discharge date: 02/27/2021  Time spent: 35 minutes  Recommendations for Outpatient Follow-up:  Continue with hospice care.  Discharge Diagnoses:  Active Hospital Problems   Diagnosis Date Noted   Pacemaker infection ([place])    Severe late onset Alzheimer's dementia without behavioral disturbance, psychotic disturbance, mood disturbance, or anxiety (HCC)    Elevated creatine kinase    Elevated lactic acid level    Goals of care, counseling/discussion    Protein-calorie malnutrition, severe 02/23/2021   Endocarditis, valve unspecified    Cold extremities    Recurrent falls    Ulcer of left foot with fat layer exposed ([place])    Sepsis ([place]) 02/20/2021    Resolved Hospital Problems  No resolved problems to display.    Discharge Condition: Stable  Diet recommendation: Resume previous diet.  Vitals:   02/26/21 2048 02/27/21 0236  BP: 102/79 112/66  Pulse: (!) 52 64  Resp: 20 20  Temp: 98.2 F (36.8 C) 98 F (36.7 C)  SpO2: 94% 97%    History of present illness:  [name] [name] is a 87 y.o. female with medical history significant of left foot nonhealing wound x2 weeks, PAF on Eliquis, SSS status post PPM, HTN, HLD, dementia (lives alone), presented with fall at home.  Daughter reported that patient sustained an open wound of left dorsal foot about 2 weeks ago, for which patient completed 7 days of oral antibiotics and had outpatient debridement 5 days prior to presentation to the ED with orthopedic surgery, Dr. Sharol Given.  Was taking doxycycline for the last 4 days prior to admission.  She was found on the floor at her home and was unable to get up on her own.  Was brought in to the ED via EMS.  In the ED, she was found to be volume overload, chest x-ray showed bilateral pleural effusions, 2+ pitting edema in lower extremity bilaterally.  Patient was started on IV  vancomycin and Zosyn empirically due to concern for sepsis secondary to left foot wound infection.  IV diuretics were held due to sepsis.  Hospital course complicated by pacemaker infection for which cardiology electrophysiology and ID were consulted.  Per cardiology electrophysiology to perform a TEE is not appropriate given her frailty, device extraction is also not a possibility.  During the course of her hospitalization, IV antibiotics were switched, Zosyn was changed to IV cefepime.  Superficial wound culture obtained outpatient, returned positive for Pseudomonas Putida, pansensitive.  IV vancomycin was discontinued on 02/22/2021 and IV cefepime was continued.  She was seen by infectious disease.  She was also seen by orthopedic surgery with plan to continue local wound care outpatient with possible skin grafting once the wound bed is healthy and viable.   Palliative care team was consulted to assist with establishing goals of care.  The patient's son and daughter have decided not to continue antibiotics at discharge.  Family has made decision for hospice care.  Discharge delayed on 02/26/2021 due to positive COVID-19 screening test.  Repeated COVID-19 screening test on 02/27/2021 negative.  02/27/2021: Seen at bedside.  She has no new complaints.  Hospital Course:  Principal Problem:   Pacemaker infection [facility]) Active Problems:   Sepsis ([place])   Cold extremities   Recurrent falls   Ulcer of left foot with fat layer exposed ([place])   Protein-calorie malnutrition, severe   Endocarditis, valve unspecified   Elevated creatine  kinase   Elevated lactic acid level   Goals of care, counseling/discussion   Severe late onset Alzheimer's dementia without behavioral disturbance, psychotic disturbance, mood disturbance, or anxiety (HCC)  Sepsis secondary to left foot wound infection, sepsis criteria has resolved. Evidenced by leukocytosis, elevated lactate, signs of endorgan damage of acute  metabolic encephalopathy/confusion and AKI, source is left foot deep wound infection - Outpatient superficial culture shows Pseudomonas Putida, pansensitive (obtained on 02/15/21) - Blood cx NGTD Received cefepime IV vancomycin dc'd per ID on 02/22/2021. Seen by orthopedic surgery and ID. Orthopedic surgery's plan was as followed: to continue local wound care outpatient with possible skin grafting once the wound bed is healthy and viable.    Pacemaker infection, seen on 2D echo Blood cultures peripherally negative final. Wound culture grew Pseudomonas, received cefepime as recommended by infectious disease. Family has declined aggressive management. No plan for invasive procedures Rest of management per infectious disease Family has decided not to continue antibiotics at discharge.   Acute blood loss anemia with positive FOBT with concern for upper GI bleed. Drop in hemoglobin 10.0 from 12.7 on 02/24/2021. Dark stools reported on 02/23/2021 with positive FOBT, heparin drip was stopped. Last hemoglobin 10.9 K, hematocrit 32.6.   Thrombocytopenia Platelet count downtrending, off anticoagulation. Last platelet count 63,000   Newly diagnosed/acute systolic CHF - TTE on 02/21/2021 showed LVEF 25 to 30%.   Strict I's and O's and daily weight   Bilateral lower extremity edema, improving Has been going on for some time, likely impairing wound healing. Potentially related to heart failure vs venous insufficiency; liikely multifactorial  Abnormal Echocardiogram  Right atrium thrombus vs vegetation TTE w/small mobile echodensity adherent to pacemaker lead. - EP consulted; would favor treating empirically rather than pursuing TEE and lead extraction given age, advanced dementia and risks associated w/procedure   Resolved prerenal AKI Likely secondary to dehydration from poor oral intake. Renal function is back to baseline   Resolving abnormal LFTS - No RUQ tenderness, likely secondary  to decompensation of, and or mild rhabdomyolysis Direct bilirubin 0.41.  Rest of LFTs normalized.   Abnormal right toe brachial index Per ultrasound report, although ankle-brachial indices are within normal limits, arterial Doppler waveforms at the ankle suggest some component of arterial occlusive disease   Prolonged QTC QTC 551 on 02/24/2021. Avoid QTC prolonging agents Last serum potassium 4.7.   Left hip pain, POA - Pelvic x-ray non-conclusive. No fracture seen on CT scan. - pain meds prn, cool compress   A. fib, paroxysmal Rate controlled. Currently off anticoagulation due to concern for bleeding and due to worsening thrombocytopenia   HTN BP is stable, not on oral antihypertensives. - Continue to hold off home HCTZ   Mild rhabdomyolysis Likely related to Poor PO intake and AKI - CK downdrended - S/p hydration; received judicious IV fluid due to acute systolic CHF.   Severe protein calorie malnutrition -Seen by dietitian - Encourage oral protein calorie intake - liberalize diet -Continue oral supplement   Generalized weakness Continue fall precautions   Goals of care Palliative care team consulted to assist with establishing goals of care. DNR, plan to DC to hospice residence.    Consults called: Orthopedic surgery, cardiology/EP, infectious disease, palliative care team.  Procedure: 2D echo.  Discharge Exam: BP 112/66 (BP Location: Right Arm)   Pulse 64   Temp 98 F (36.7 C)   Resp 20   Ht 5\' 1"  (1.549 m)   Wt 45.5 kg   SpO2 97%  BMI 18.95 kg/m  General: 87 y.o. year-old female frail-appearing in no acute distress.  Alert and pleasantly confused. Cardiovascular: Regular rate and rhythm with no rubs or gallops.  No thyromegaly or JVD noted.   Respiratory: Clear to auscultation with no wheezes or rales. Good inspiratory effort. Abdomen: Soft nontender nondistended with normal bowel sounds x4 quadrants. Musculoskeletal: Trace lower extremity  edema Skin:  Psychiatry: Mood is appropriate for condition and setting  Discharge Instructions You were cared for by a hospitalist during your hospital stay. If you have any questions about your discharge medications or the care you received while you were in the hospital after you are discharged, you can call the unit and asked to speak with the hospitalist on call if the hospitalist that took care of you is not available. Once you are discharged, your primary care physician will handle any further medical issues. Please note that NO REFILLS for any discharge medications will be authorized once you are discharged, as it is imperative that you return to your primary care physician (or establish a relationship with a primary care physician if you do not have one) for your aftercare needs so that they can reassess your need for medications and monitor your lab values.   Allergies as of 02/27/2021   No Known Allergies      Medication List     STOP taking these medications    apixaban 5 MG Tabs tablet Commonly known as: Eliquis   atorvastatin 10 MG tablet Commonly known as: LIPITOR   ciprofloxacin 500 MG tablet Commonly known as: CIPRO   diltiazem 120 MG 24 hr capsule Commonly known as: CARDIZEM CD   doxycycline 100 MG tablet Commonly known as: VIBRA-TABS   hydrochlorothiazide 12.5 MG tablet Commonly known as: HYDRODIURIL   levothyroxine 75 MCG tablet Commonly known as: SYNTHROID   memantine 5 MG tablet Commonly known as: NAMENDA   Vitamin D3 1.25 MG (50000 UT) Caps       TAKE these medications    dorzolamide-timolol 22.3-6.8 MG/ML ophthalmic solution Commonly known as: COSOPT Place 1 drop into both eyes 2 (two) times daily.   nutrition supplement (JUVEN) Pack Take 1 packet by mouth 2 (two) times daily between meals.   silver sulfADIAZINE 1 % cream Commonly known as: SILVADENE Apply 1 application topically daily. Apply to affected area daily plus dry  dressing What changed: additional instructions       No Known Allergies  Follow-up Information     Newt Minion, MD Follow up in 1 week(s).   Specialty: Orthopedic Surgery Contact information: [address] [place] [id number] [phone]                  The results of significant diagnostics from this hospitalization (including imaging, microbiology, ancillary and laboratory) are listed below for reference.    Significant Diagnostic Studies: CT HEAD WO CONTRAST  Result Date: 02/20/2021 CLINICAL DATA:  Head trauma EXAM: CT HEAD WITHOUT CONTRAST TECHNIQUE: Contiguous axial images were obtained from the base of the skull through the vertex without intravenous contrast. COMPARISON:  None. FINDINGS: Brain: No acute intracranial hemorrhage, mass effect, or herniation. No extra-axial fluid collections. No evidence of acute territorial infarct. No hydrocephalus. Mild cortical volume loss. Patchy hypodensities in the periventricular and subcortical white matter, likely secondary to chronic microvascular ischemic changes. Vascular: No hyperdense vessel or unexpected calcification. Skull: Normal. Negative for fracture or focal lesion. Sinuses/Orbits: No acute process identified. Surgical changes in the globes. Other: None. IMPRESSION:  Chronic changes with no acute intracranial process identified. Electronically Signed   By: Ofilia Neas M.D.   On: 02/20/2021 10:59   DG Pelvis Portable  Result Date: 02/20/2021 CLINICAL DATA:  Trauma, fall EXAM: PORTABLE PELVIS 1-2 VIEWS COMPARISON:  None. FINDINGS: No displaced fracture or dislocation is seen. There is a faint linear lucency in the lateral aspect of greater trochanter in the left femur. Degenerative changes are noted in the lower lumbar spine. IMPRESSION: No displaced fracture or dislocation is seen. There is linear lucency seen in the greater trochanter of the proximal left femur which may be an artifact due to confluence of  soft tissues over the bony margin or suggest undisplaced fracture. Please correlate with clinical symptoms and physical examination findings. If there are focal symptoms in the area of greater trochanter of proximal left femur, repeat radiographic examination and CT if warranted should be considered. Electronically Signed   By: Elmer Picker M.D.   On: 02/20/2021 10:46   CT HIP LEFT WO CONTRAST  Result Date: 02/20/2021 CLINICAL DATA:  [place]. EXAM: CT OF THE LEFT HIP WITHOUT CONTRAST, CT OF THE RIGHT HIP WITHOUT CONTRAST TECHNIQUE: Multidetector CT imaging of BOTH HIPS was performed according to the standard protocol. Multiplanar CT image reconstructions were also generated. COMPARISON:  Radiographs 02/20/2021 FINDINGS: Both hips are normally located. Mild bilateral hip joint degenerative changes given the patient's age. No acute hip fracture or evidence of AVN. The bony pelvis is intact. No pelvic fractures are identified. Moderate degenerative changes at the pubic symphysis. The SI joints are intact. The hip and pelvic musculature are grossly normal by CT. No obvious muscle tear or intramuscular hematoma. Mom no inguinal mass or hernia. No significant intrapelvic abnormalities. IMPRESSION: 1. Mild bilateral hip joint degenerative changes. 2. No acute hip fracture or evidence of AVN. 3. No pelvic fractures are identified. Electronically Signed   By: Marijo Sanes M.D.   On: 02/20/2021 13:57   CT HIP RIGHT WO CONTRAST  Result Date: 02/20/2021 CLINICAL DATA:  [place]. EXAM: CT OF THE LEFT HIP WITHOUT CONTRAST, CT OF THE RIGHT HIP WITHOUT CONTRAST TECHNIQUE: Multidetector CT imaging of BOTH HIPS was performed according to the standard protocol. Multiplanar CT image reconstructions were also generated. COMPARISON:  Radiographs 02/20/2021 FINDINGS: Both hips are normally located. Mild bilateral hip joint degenerative changes given the patient's age. No acute hip fracture or evidence of AVN. The bony pelvis is  intact. No pelvic fractures are identified. Moderate degenerative changes at the pubic symphysis. The SI joints are intact. The hip and pelvic musculature are grossly normal by CT. No obvious muscle tear or intramuscular hematoma. Mom no inguinal mass or hernia. No significant intrapelvic abnormalities. IMPRESSION: 1. Mild bilateral hip joint degenerative changes. 2. No acute hip fracture or evidence of AVN. 3. No pelvic fractures are identified. Electronically Signed   By: Marijo Sanes M.D.   On: 02/20/2021 13:57   DG Chest Port 1 View  Result Date: 02/20/2021 CLINICAL DATA:  Trauma, sepsis EXAM: PORTABLE CHEST 1 VIEW COMPARISON:  None FINDINGS: Transverse diameter of heart is increased. Central pulmonary vessels are prominent. There are no signs of alveolar pulmonary edema. Small bilateral pleural effusions are seen, more so on the left side. Evaluation of lower lung fields for infiltrates is limited by pleural effusions. There is no pneumothorax. Pacemaker battery is seen in the left infraclavicular region with tips of leads in the right atrium and right ventricle. IMPRESSION: Cardiomegaly. Small bilateral pleural effusions, more so on  the left side. There are no signs of alveolar pulmonary edema. Evaluation of lower lung fields for infiltrates is limited by the effusions. Electronically Signed   By: Elmer Picker M.D.   On: 02/20/2021 10:42   DG Foot Complete Left  Result Date: 02/06/2021 CLINICAL DATA:  Left foot swelling with redness and drainage after insect sting 2 weeks ago EXAM: LEFT FOOT - COMPLETE 3+ VIEW COMPARISON:  None. FINDINGS: There is no evidence of fracture or dislocation. No erosion or periosteal elevation. Degenerative changes are most pronounced within the interphalangeal joints of the second and third toes. Prominent soft tissue swelling about the forefoot. No soft tissue gas. IMPRESSION: 1. No radiographic evidence to suggest osteomyelitis. 2. Prominent soft tissue swelling  about the forefoot. No soft tissue gas. Electronically Signed   By: Davina Poke D.O.   On: 02/06/2021 16:47   VAS [place] ABI WITH/WO TBI  Result Date: 02/22/2021  LOWER EXTREMITY DOPPLER STUDY Patient Name:  Jenifer Crossett Boyson  Date of Exam:   02/21/2021 Medical Rec #: [id number]       Accession #:    [id number] Date of Birth: 17-Sep-1924       Patient Gender: F Patient Age:   26 years Exam Location:  [facility] Procedure:      VAS [place] ABI WITH/WO TBI Referring Phys: Wynetta Fines --------------------------------------------------------------------------------  Indications: Non-healing wound on dorsum of LT foot. High Risk Factors: Hypertension, hyperlipidemia.  Limitations: Today's exam was limited due to an open wound and bandages. Comparison Study: No prior studies. Performing Technologist: Darlin Coco RDMS RVT  Examination Guidelines: A complete evaluation includes at minimum, Doppler waveform signals and systolic blood pressure reading at the level of bilateral brachial, anterior tibial, and posterior tibial arteries, when vessel segments are accessible. Bilateral testing is considered an integral part of a complete examination. Photoelectric Plethysmograph (PPG) waveforms and toe systolic pressure readings are included as required and additional duplex testing as needed. Limited examinations for reoccurring indications may be performed as noted.  ABI Findings: +---------+------------------+-----+----------+--------+ Right    Rt Pressure (mmHg)IndexWaveform  Comment  +---------+------------------+-----+----------+--------+ Brachial 105                                       +---------+------------------+-----+----------+--------+ PTA      123               1.16 monophasic         +---------+------------------+-----+----------+--------+ DP       121               1.14 biphasic           +---------+------------------+-----+----------+--------+ Great Toe66                0.62  Abnormal           +---------+------------------+-----+----------+--------+ +---------+------------------+-----+--------+-------+ Left     Lt Pressure (mmHg)IndexWaveformComment +---------+------------------+-----+--------+-------+ Brachial 106                                    +---------+------------------+-----+--------+-------+ PTA      154               1.45 biphasic        +---------+------------------+-----+--------+-------+ DP       106  1.00 biphasic        +---------+------------------+-----+--------+-------+ Great Toe78                0.74 Normal          +---------+------------------+-----+--------+-------+ +-------+-----------+-----------+------------+------------+ ABI/TBIToday's ABIToday's TBIPrevious ABIPrevious TBI +-------+-----------+-----------+------------+------------+ Right  1.16       0.64                                +-------+-----------+-----------+------------+------------+ Left   1.45       0.74                                +-------+-----------+-----------+------------+------------+ Arterial wall calcification precludes accurate ankle pressures and ABIs.  Summary: Right: Resting right ankle-brachial index is within normal range. No evidence of significant right lower extremity arterial disease. The right toe-brachial index is abnormal. Although ankle brachial indices are within normal limits (0.95-1.29), arterial Doppler waveforms at the ankle suggest some component of arterial occlusive disease.  Left: Resting left ankle-brachial index indicates noncompressible left lower extremity arteries. The left toe-brachial index is normal.  *See table(s) above for measurements and observations.  Electronically signed by Harold Barban MD on 02/22/2021 at 12:51:16 AM.    Final    ECHOCARDIOGRAM COMPLETE  Result Date: 02/21/2021    ECHOCARDIOGRAM REPORT   Patient Name:   FARHANA KOGLIN Desa Date of Exam: 02/21/2021 Medical Rec #:   [id number]      Height:       61.0 in Accession #:    [id number]     Weight:       96.1 lb Date of Birth:  1924/10/14      BSA:          1.383 m Patient Age:    87 years       BP:           95/61 mmHg Patient Gender: F              HR:           61 bpm. Exam Location:  Inpatient Procedure: 2D Echo, 3D Echo, Cardiac Doppler, Color Doppler and Intracardiac            Opacification Agent  Results communicated to Dr Nevada Crane at 13:13 on 02/21/21. Indications:    I50.40* Unspecified combined systolic (congestive) and diastolic                 (congestive) heart failure  History:        Patient has no prior history of Echocardiogram examinations.                 Pacemaker and Abnormal ECG, Arrythmias:Atrial Fibrillation,                 Signs/Symptoms:Altered Mental Status and Alzheimer's; Risk                 Factors:Hypertension and Dyslipidemia.  Sonographer:    Roseanna Rainbow RDCS Referring Phys: [id number] [place]  1. Left ventricular ejection fraction, by estimation, is 25 to 30%. The left ventricle has severely decreased function. The left ventricle has no regional wall motion abnormalities. The left ventricular internal cavity size was mildly dilated. There is mild left ventricular hypertrophy. Left ventricular diastolic parameters are indeterminate.  2. Right ventricular systolic function is mildly reduced. The right ventricular size is moderately  enlarged. There is mildly elevated pulmonary artery systolic pressure. The estimated right ventricular systolic pressure is Q000111Q mmHg.  3. Small mobil echodensity adherent to pacemaker lead in right atrium (image 30), could represent thrombus or vegetation, would recommend checking blood cultures  4. Left atrial size was severely dilated.  5. Right atrial size was severely dilated.  6. The mitral valve is abnormal. Severe mitral valve regurgitation. Appears functional. No evidence of mitral stenosis.  7. Tricuspid valve regurgitation is severe. Hepatic vein systolic  flow reversal.  8. The aortic valve is tricuspid. Aortic valve regurgitation is not visualized. Aortic valve sclerosis is present, with no evidence of aortic valve stenosis.  9. The inferior vena cava is dilated in size with <50% respiratory variability, suggesting right atrial pressure of 15 mmHg. FINDINGS  Left Ventricle: Left ventricular ejection fraction, by estimation, is 25 to 30%. The left ventricle has severely decreased function. The left ventricle has no regional wall motion abnormalities. The left ventricular internal cavity size was mildly dilated. There is mild left ventricular hypertrophy. Left ventricular diastolic parameters are indeterminate. Right Ventricle: The right ventricular size is moderately enlarged. Right vetricular wall thickness was not well visualized. Right ventricular systolic function is mildly reduced. There is mildly elevated pulmonary artery systolic pressure. The tricuspid  regurgitant velocity is 2.50 m/s, and with an assumed right atrial pressure of 15 mmHg, the estimated right ventricular systolic pressure is Q000111Q mmHg. Left Atrium: Left atrial size was severely dilated. Right Atrium: Right atrial size was severely dilated. Pericardium: There is no evidence of pericardial effusion. Mitral Valve: The mitral valve is abnormal. Severe mitral valve regurgitation. No evidence of mitral valve stenosis. Tricuspid Valve: The tricuspid valve is normal in structure. Tricuspid valve regurgitation is severe. Aortic Valve: The aortic valve is tricuspid. Aortic valve regurgitation is not visualized. Aortic valve sclerosis is present, with no evidence of aortic valve stenosis. Pulmonic Valve: The pulmonic valve was not well visualized. Pulmonic valve regurgitation is mild. Aorta: The aortic root and ascending aorta are structurally normal, with no evidence of dilitation. Venous: The inferior vena cava is dilated in size with less than 50% respiratory variability, suggesting right atrial  pressure of 15 mmHg. IAS/Shunts: The interatrial septum was not well visualized. Additional Comments: There is a small pleural effusion in the left lateral region.  LEFT VENTRICLE PLAX 2D LVIDd:         5.10 cm      Diastology LVIDs:         4.60 cm      LV e' medial:    3.75 cm/s LV PW:         0.90 cm      LV E/e' medial:  29.9 LV IVS:        1.00 cm      LV e' lateral:   4.12 cm/s LVOT diam:     1.80 cm      LV E/e' lateral: 27.2 LV SV:         37 LV SV Index:   27 LVOT Area:     2.54 cm                              3D Volume EF: LV Volumes (MOD)            3D EF:        28 % LV vol d, MOD A2C: 134.0 ml LV EDV:  143 ml LV vol d, MOD A4C: 110.0 ml LV ESV:       103 ml LV vol s, MOD A2C: 100.0 ml LV SV:        40 ml LV vol s, MOD A4C: 90.0 ml LV SV MOD A2C:     34.0 ml LV SV MOD A4C:     110.0 ml LV SV MOD BP:      28.0 ml RIGHT VENTRICLE            IVC RV S prime:     8.74 cm/s  IVC diam: 2.30 cm TAPSE (M-mode): 1.6 cm LEFT ATRIUM             Index        RIGHT ATRIUM           Index LA diam:        4.40 cm 3.18 cm/m   RA Area:     26.80 cm LA Vol (A2C):   99.1 ml 71.64 ml/m  RA Volume:   83.90 ml  60.65 ml/m LA Vol (A4C):   77.7 ml 56.17 ml/m LA Biplane Vol: 87.4 ml 63.18 ml/m  AORTIC VALVE             PULMONIC VALVE LVOT Vmax:   87.80 cm/s  PR End Diast Vel: 2.55 msec LVOT Vmean:  51.100 cm/s LVOT VTI:    0.145 m  AORTA Ao Root diam: 3.00 cm Ao Asc diam:  3.10 cm MITRAL VALVE                  TRICUSPID VALVE MV Area (PHT): 4.04 cm       TR Peak grad:   25.0 mmHg MV Decel Time: 188 msec       TR Vmax:        250.00 cm/s MR Peak grad:    63.0 mmHg MR Mean grad:    38.0 mmHg    SHUNTS MR Vmax:         397.00 cm/s  Systemic VTI:  0.14 m MR Vmean:        283.0 cm/s   Systemic Diam: 1.80 cm MR PISA:         1.90 cm MR PISA Eff ROA: 15 mm MR PISA Radius:  0.55 cm MV E velocity: 112.00 cm/s MV A velocity: 36.20 cm/s MV E/A ratio:  3.09 Oswaldo Milian MD Electronically signed by Oswaldo Milian  MD Signature Date/Time: 02/21/2021/1:14:28 PM    Final    CUP PACEART REMOTE DEVICE CHECK  Result Date: 02/07/2021 Scheduled remote reviewed. Normal device function.  1 NSVT, slightly irregular, RVR vs NSVT.  Onset not available, abrupt termination. Known PAF, Eliquis Next remote 91 days. LR   Microbiology: Recent Results (from the past 240 hour(s))  Culture, blood (Routine x 2)     Status: None   Collection Time: 02/20/21 10:25 AM   Specimen: BLOOD  Result Value Ref Range Status   Specimen Description BLOOD LEFT ANTECUBITAL  Final   Special Requests   Final    BOTTLES DRAWN AEROBIC AND ANAEROBIC Blood Culture adequate volume   Culture   Final    NO GROWTH 5 DAYS Performed at [facility], [address] [place], [place] [address]    Report Status 02/25/2021 FINAL  Final  Culture, blood (Routine x 2)     Status: None   Collection Time: 02/20/21 10:36 AM   Specimen: BLOOD  Result Value Ref Range Status  Specimen Description BLOOD RIGHT ANTECUBITAL  Final   Special Requests   Final    BOTTLES DRAWN AEROBIC AND ANAEROBIC Blood Culture results may not be optimal due to an inadequate volume of blood received in culture bottles   Culture   Final    NO GROWTH 5 DAYS Performed at [facility], [place] [address] [place], [place] [address]    Report Status 02/25/2021 FINAL  Final  Resp Panel by RT-PCR (Flu A&B, Covid) Nasopharyngeal Swab     Status: None   Collection Time: 02/20/21  3:34 PM   Specimen: Nasopharyngeal Swab; Nasopharyngeal(NP) swabs in vial transport medium  Result Value Ref Range Status   SARS Coronavirus 2 by RT PCR NEGATIVE NEGATIVE Final    Comment: (NOTE) SARS-CoV-2 target nucleic acids are NOT DETECTED.  The SARS-CoV-2 RNA is generally detectable in upper respiratory specimens during the acute phase of infection. The lowest concentration of SARS-CoV-2 viral copies this assay can detect is 138 copies/mL. A negative result does not preclude  SARS-Cov-2 infection and should not be used as the sole basis for treatment or other patient management decisions. A negative result may occur with  improper specimen collection/handling, submission of specimen other than nasopharyngeal swab, presence of viral mutation(s) within the areas targeted by this assay, and inadequate number of viral copies(<138 copies/mL). A negative result must be combined with clinical observations, patient history, and epidemiological information. The expected result is Negative.  Fact Sheet for Patients:  EntrepreneurPulse.com.au  Fact Sheet for Healthcare Providers:  IncredibleEmployment.be  This test is no t yet approved or cleared by the [place] FDA and  has been authorized for detection and/or diagnosis of SARS-CoV-2 by FDA under an Emergency Use Authorization (EUA). This EUA will remain  in effect (meaning this test can be used) for the duration of the COVID-19 declaration under Section 564(b)(1) of the Act, 21 U.S.C.section 360bbb-3(b)(1), unless the authorization is terminated  or revoked sooner.       Influenza A by PCR NEGATIVE NEGATIVE Final   Influenza B by PCR NEGATIVE NEGATIVE Final    Comment: (NOTE) The Xpert Xpress SARS-CoV-2/FLU/RSV plus assay is intended as an aid in the diagnosis of influenza from Nasopharyngeal swab specimens and should not be used as a sole basis for treatment. Nasal washings and aspirates are unacceptable for Xpert Xpress SARS-CoV-2/FLU/RSV testing.  Fact Sheet for Patients: EntrepreneurPulse.com.au  Fact Sheet for Healthcare Providers: IncredibleEmployment.be  This test is not yet approved or cleared by the [place] FDA and has been authorized for detection and/or diagnosis of SARS-CoV-2 by FDA under an Emergency Use Authorization (EUA). This EUA will remain in effect (meaning this test can be used) for the duration of  the COVID-19 declaration under Section 564(b)(1) of the Act, 21 U.S.C. section 360bbb-3(b)(1), unless the authorization is terminated or revoked.  Performed at [facility], [place] [address] [place], [place] [address]   MRSA Next Gen by PCR, Nasal     Status: None   Collection Time: 02/22/21  4:59 PM   Specimen: Nasal Mucosa; Nasal Swab  Result Value Ref Range Status   MRSA by PCR Next Gen NOT DETECTED NOT DETECTED Final    Comment: (NOTE) The GeneXpert MRSA Assay (FDA approved for NASAL specimens only), is one component of a comprehensive MRSA colonization surveillance program. It is not intended to diagnose MRSA infection nor to guide or monitor treatment for MRSA infections. Test performance is not FDA approved in patients less than  54 years old. Performed at [facility], [place] [address], [place], [place] [address]   Resp Panel by RT-PCR (Flu A&B, Covid) Nasopharyngeal Swab     Status: Abnormal   Collection Time: 02/26/21 12:32 PM   Specimen: Nasopharyngeal Swab; Nasopharyngeal(NP) swabs in vial transport medium  Result Value Ref Range Status   SARS Coronavirus 2 by RT PCR POSITIVE (A) NEGATIVE Final    Comment: RESULT CALLED TO, READ BACK BY AND VERIFIED WITH: RN L YOUNG [id number] AT 1418 BY CM (NOTE) SARS-CoV-2 target nucleic acids are DETECTED.  The SARS-CoV-2 RNA is generally detectable in upper respiratory specimens during the acute phase of infection. Positive results are indicative of the presence of the identified virus, but do not rule out bacterial infection or co-infection with other pathogens not detected by the test. Clinical correlation with patient history and other diagnostic information is necessary to determine patient infection status. The expected result is Negative.  Fact Sheet for Patients: EntrepreneurPulse.com.au  Fact Sheet for Healthcare Providers: IncredibleEmployment.be  This test is not yet  approved or cleared by the [place] FDA and  has been authorized for detection and/or diagnosis of SARS-CoV-2 by FDA under an Emergency Use Authorization (EUA).  This EUA will remain in effect (meaning this test can be u sed) for the duration of  the COVID-19 declaration under Section 564(b)(1) of the Act, 21 U.S.C. section 360bbb-3(b)(1), unless the authorization is terminated or revoked sooner.     Influenza A by PCR NEGATIVE NEGATIVE Final   Influenza B by PCR NEGATIVE NEGATIVE Final    Comment: (NOTE) The Xpert Xpress SARS-CoV-2/FLU/RSV plus assay is intended as an aid in the diagnosis of influenza from Nasopharyngeal swab specimens and should not be used as a sole basis for treatment. Nasal washings and aspirates are unacceptable for Xpert Xpress SARS-CoV-2/FLU/RSV testing.  Fact Sheet for Patients: EntrepreneurPulse.com.au  Fact Sheet for Healthcare Providers: IncredibleEmployment.be  This test is not yet approved or cleared by the [place] FDA and has been authorized for detection and/or diagnosis of SARS-CoV-2 by FDA under an Emergency Use Authorization (EUA). This EUA will remain in effect (meaning this test can be used) for the duration of the COVID-19 declaration under Section 564(b)(1) of the Act, 21 U.S.C. section 360bbb-3(b)(1), unless the authorization is terminated or revoked.  Performed at [facility], [place] [address], [place], [place] [address]   Resp Panel by RT-PCR (Flu A&B, Covid) Nasopharyngeal Swab     Status: None   Collection Time: 02/27/21 11:46 AM   Specimen: Nasopharyngeal Swab; Nasopharyngeal(NP) swabs in vial transport medium  Result Value Ref Range Status   SARS Coronavirus 2 by RT PCR NEGATIVE NEGATIVE Final    Comment: (NOTE) SARS-CoV-2 target nucleic acids are NOT DETECTED.  The SARS-CoV-2 RNA is generally detectable in upper respiratory specimens during the acute phase of infection. The  lowest concentration of SARS-CoV-2 viral copies this assay can detect is 138 copies/mL. A negative result does not preclude SARS-Cov-2 infection and should not be used as the sole basis for treatment or other patient management decisions. A negative result may occur with  improper specimen collection/handling, submission of specimen other than nasopharyngeal swab, presence of viral mutation(s) within the areas targeted by this assay, and inadequate number of viral copies(<138 copies/mL). A negative result must be combined with clinical observations, patient history, and epidemiological information. The expected result is Negative.  Fact Sheet for Patients:  EntrepreneurPulse.com.au  Fact Sheet for Healthcare Providers:  IncredibleEmployment.be  This test is no t yet approved or cleared by the [place] and  has been authorized for detection and/or diagnosis of SARS-CoV-2 by FDA under an Emergency Use Authorization (EUA). This EUA will remain  in effect (meaning this test can be used) for the duration of the COVID-19 declaration under Section 564(b)(1) of the Act, 21 U.S.C.section 360bbb-3(b)(1), unless the authorization is terminated  or revoked sooner.       Influenza A by PCR NEGATIVE NEGATIVE Final   Influenza B by PCR NEGATIVE NEGATIVE Final    Comment: (NOTE) The Xpert Xpress SARS-CoV-2/FLU/RSV plus assay is intended as an aid in the diagnosis of influenza from Nasopharyngeal swab specimens and should not be used as a sole basis for treatment. Nasal washings and aspirates are unacceptable for Xpert Xpress SARS-CoV-2/FLU/RSV testing.  Fact Sheet for Patients: EntrepreneurPulse.com.au  Fact Sheet for Healthcare Providers: IncredibleEmployment.be  This test is not yet approved or cleared by the [place] FDA and has been authorized for detection and/or diagnosis of SARS-CoV-2 by FDA under  an Emergency Use Authorization (EUA). This EUA will remain in effect (meaning this test can be used) for the duration of the COVID-19 declaration under Section 564(b)(1) of the Act, 21 U.S.C. section 360bbb-3(b)(1), unless the authorization is terminated or revoked.  Performed at [facility], [place] [address], [place], [place] [address]      Labs: Basic Metabolic Panel: Recent Labs  Lab 02/21/21 0324 02/22/21 0225 02/23/21 0346 02/24/21 0329 02/25/21 0307  NA 137 137 138 138 138  K 3.7 3.7 4.0 3.8 4.7  CL 107 109 110 109 110  CO2 18* 19* 22 23 22   GLUCOSE 66* 104* 92 95 114*  BUN 33* 46* 45* 48* 42*  CREATININE 1.12* 0.89 0.68 0.57 0.62  CALCIUM 9.6 9.8 9.3 9.1 9.6   Liver Function Tests: Recent Labs  Lab 02/22/21 0225 02/23/21 0346 02/24/21 0329 02/25/21 0307 02/26/21 0322  AST 55* 40 38 39 36  ALT 36 33 32 35 36  ALKPHOS 73 63 68 74 80  BILITOT 1.2 1.3* 1.3* 1.1 1.2  PROT 5.3* 4.8* 4.5* 4.9* 5.2*  ALBUMIN 3.4* 2.9* 2.7* 2.8* 2.8*   No results for input(s): LIPASE, AMYLASE in the last 168 hours. No results for input(s): AMMONIA in the last 168 hours. CBC: Recent Labs  Lab 02/23/21 0346 02/23/21 1859 02/24/21 0329 02/24/21 1320 02/25/21 0307  WBC 7.1 10.1 5.7 7.7 7.6  HGB 11.9* 12.7 10.0* 11.2* 10.9*  HCT 35.1* 37.7 29.6* 32.7* 32.6*  MCV 98.6 101.1* 97.4 99.4 100.0  PLT 71* 80* 60* 64* 63*   Cardiac Enzymes: Recent Labs  Lab 02/21/21 0324 02/23/21 0912  CKTOTAL 481* 65   BNP: BNP (last 3 results) No results for input(s): BNP in the last 8760 hours.  ProBNP (last 3 results) No results for input(s): PROBNP in the last 8760 hours.  CBG: No results for input(s): GLUCAP in the last 168 hours.     Signed:  Kayleen Memos, MD [facility] 02/27/2021, 2:24 PM

## 2021-02-27 NOTE — Progress Notes (Signed)
Physical Therapy Treatment Patient Details Name: Marzetta Boardauline L Sardina MRN: [id number] DOB: Mar 26, 1925 Today's Date: 02/27/2021   History of Present Illness Pt is a 87 year old woman admitted after falling on 02/20/21.  Pt + sepsis PMH: non healing wound on dorsum of L foot sustained when walking in her yard barefoot and encountering yellow jackets, has been being treated by PCP and orthopedist wth outpt debridement, HLD, PAF, hypothyroid, SSS with pacemaker, mild dementia.    PT Comments    Pt was seen for progressing her mobility but declines to try to walk.  After reviewed exercises, pt is tired. Note low intake, tending to be minimally active now.  Follow up with activity as she can, focusing on standing and balancing as she tolerates.  Pt is HOH, struggling with the limits of PPE required by her Covid.  Recommend trying written communication if needed.  Recommendations for follow up therapy are one component of a multi-disciplinary discharge planning process, led by the attending physician.  Recommendations may be updated based on patient status, additional functional criteria and insurance authorization.  Follow Up Recommendations  Home health PT     Assistance Recommended at Discharge Frequent or constant Supervision/Assistance  Equipment Recommendations  Rollator (4 wheels)    Recommendations for Other Services       Precautions / Restrictions Precautions Precautions: Fall Precaution Comments: L foot wound Required Braces or Orthoses: Other Brace Other Brace: Prevalon boots in room for BLE's Restrictions Weight Bearing Restrictions: No     Mobility  Bed Mobility Overal bed mobility: Needs Assistance Bed Mobility: Supine to Sit     Supine to sit: Min assist     General bed mobility comments: up in chair    Transfers Overall transfer level: Needs assistance Equipment used: Rolling walker (2 wheels);None Transfers: Sit to/from Stand Sit to Stand: Min assist            General transfer comment: declined    Ambulation/Gait                   Stairs             Wheelchair Mobility    Modified Rankin (Stroke Patients Only)       Balance Overall balance assessment: Needs assistance Sitting-balance support: Single extremity supported Sitting balance-Leahy Scale: Good Sitting balance - Comments: able to maintain sitting balance   Standing balance support: Bilateral upper extremity supported Standing balance-Leahy Scale: Fair Standing balance comment: reliant on BUE support with standing                            Cognition Arousal/Alertness: Awake/alert Behavior During Therapy: WFL for tasks assessed/performed Overall Cognitive Status: History of cognitive impairments - at baseline                                 General Comments: dementia, HOH        Exercises General Exercises - Upper Extremity Shoulder Flexion: AROM;Both;10 reps Shoulder Extension: AROM;Both;10 reps General Exercises - Lower Extremity Ankle Circles/Pumps: AAROM;5 reps Quad Sets: AAROM;10 reps Heel Slides: AAROM;10 reps Hip ABduction/ADduction: AAROM;10 reps Straight Leg Raises: AAROM;10 reps    General Comments        Pertinent Vitals/Pain Pain Assessment: Faces Faces Pain Scale: Hurts a little bit Pain Location: L foot Pain Descriptors / Indicators: Guarding Pain Intervention(s): Monitored during session;Repositioned  Home Living                          Prior Function            PT Goals (current goals can now be found in the care plan section) Progress towards PT goals: Not progressing toward goals - comment    Frequency    Min 3X/week      PT Plan Current plan remains appropriate    Co-evaluation              AM-PAC PT "6 Clicks" Mobility   Outcome Measure  Help needed turning from your back to your side while in a flat bed without using bedrails?: A Little Help needed  moving from lying on your back to sitting on the side of a flat bed without using bedrails?: A Little Help needed moving to and from a bed to a chair (including a wheelchair)?: A Little Help needed standing up from a chair using your arms (e.g., wheelchair or bedside chair)?: A Little Help needed to walk in hospital room?: A Little Help needed climbing 3-5 steps with a railing? : Total 6 Click Score: 16    End of Session   Activity Tolerance: Patient tolerated treatment well Patient left: in chair;with call bell/phone within reach;with chair alarm set;with family/visitor present Nurse Communication: Mobility status PT Visit Diagnosis: Other abnormalities of gait and mobility (R26.89);Muscle weakness (generalized) (M62.81);Difficulty in walking, not elsewhere classified (R26.2)     Time: 7425-9563 PT Time Calculation (min) (ACUTE ONLY): 27 min  Charges:  $Therapeutic Exercise: 23-37 mins    Ivar Drape 02/27/2021, 2:15 PM  .Samul Dada, PT PhD Acute Rehab Dept. Number: [facility] 352 [phone] and [facility] [phone]

## 2021-02-27 NOTE — Consult Note (Signed)
Consultation Note Date: 02/27/2021   Patient Name: [name] [name]  DOB: 1924-05-20  MRN: [id number]  Age / Sex: 87 y.o., female  PCP: Janora Norlander, DO Referring Physician: Kayleen Memos, DO  Reason for Consultation: Establishing goals of care  HPI/Patient Profile: 87 y.o. female  with past medical history of left nonhealing wound of lower extremity x2 weeks, atrial fibrillation (Eliquis), sick sinus syndrome status post PPM, HTN, HLD, and dementia who presented with a fall at home.  Patient's daughter endorses she was stung by a bee which set off this trajectory of a wound on her lower extremity.  Patient was given 7-day series of Augmentin outpatient with debridement 5 days prior to presenting to Dr. Jess Barters office.  Family found patient on the floor complaining of left hip pain and was unable to get up by herself.  EMS was called and presentation in ED found that patient was in fluid overload with bilateral pleural effusions.  Patient also had 2+ pitting edema in both lower extremities though family endorses legs were always puffy.  Palliative medicine was consulted to establish goals of care.   Clinical Assessment and Goals of Care: I have reviewed medical records including EPIC notes, labs and imaging, assessed the patient and then met with patient, patient's son, and patient's daughter to discuss diagnosis prognosis, GOC, EOL wishes, disposition and options.  I introduced Palliative Medicine as specialized medical care for people living with serious illness. It focuses on providing relief from the symptoms and stress of a serious illness. The goal is to improve quality of life for both the patient and the family.  We discussed a brief life review of the patient.  Her son and daughter share she is immensely independent.  Even with her dementia she would still get up get dressed and take her self out to  the porch.  She never had a large appetite but ate meals that she cooked by herself.  Prior to this wound on her lower extremities she has been independent with ADLs at home.  We discussed patient's current illness and what it means in the larger context of patient's on-going co-morbidities.  Natural disease trajectory and expectations at EOL were discussed.  Family is concerned that it is not safe for her to return home with such independence.  They are also concerned with her poor p.o. intake.  I attempted to elicit values and goals of care important to the patient.  Patient's family is in agreement that they would not want the patient to have aggressive medical interventions.  We reviewed I DO NOT RESUSCITATE and allowing a natural death.  Educated family that this does not mean we do not care.  We will do everything up into the point of intubation and ventilation.  They are both in agreement that she would not want to be placed on a ventilator.  Patient CODE STATUS changed from full to DNR.  The difference between aggressive medical intervention and comfort care was considered in light of the patient's  goals of care.  During our discussion a cardiologist joined our conversation.  She shared her concerns regarding the pacer wires and potential for either a thrombus or an infection at the site.   Education offered regarding concept specific to human mortality and the limitations of medical interventions to prolong life when a patient has dementia, a progressive, terminal disease.  Family conveys understanding that dementia as well as her other medical comorbidities are contributing to her recent decline.  Family is facing treatment option decisions, advanced directive, and anticipatory care needs.  No other family members able to take the patient home with them.  She longer can live home alone independently.  She does not seem hospice appropriate at this point.  Family is hopeful she can be approved for  a nursing facility.  I shared my concerns that physical therapy thinks she is in no need for rehab at this stage.  However, I assured them I would share the family's concerns with the [facility] team and asked that they speak with them soon.  Discussed with patient/family the importance of continued conversation with family and the medical providers regarding overall plan of care and treatment options, ensuring decisions are within the context of the patient's values and GOCs.    Hospice and Palliative Care services outpatient were explained and offered.  I also shared that we will give the patient some time for outcomes.  If she continues to not eat and drink and she may make her self a candidate for hospice.  However if she does not, I outlined palliative outpatient services to follow the patient as her guide on the side in the community.  Family was anxious to speak with social worker and discuss options for rehab and or nursing facilities.  Questions and concerns were addressed. The family was encouraged to call with questions or concerns.  Palliative medicine will continue to follow patient throughout her hospitalization.  Primary Decision Maker NEXT OF KIN  Code Status/Advance Care Planning: DNR  Prognosis:   Unable to determine  Discharge Planning: To Be Determined  Primary Diagnoses: Present on Admission:  Pacemaker infection [facility])   Physical Exam Constitutional:      Appearance: Normal appearance.  HENT:     Head: Normocephalic and atraumatic.     Mouth/Throat:     Mouth: Mucous membranes are moist.  Cardiovascular:     Rate and Rhythm: Normal rate.     Pulses: Normal pulses.  Pulmonary:     Effort: Pulmonary effort is normal.  Abdominal:     Palpations: Abdomen is soft.  Musculoskeletal:     Comments: MAETC  Skin:    General: Skin is warm and dry.     Comments: Wound of LLE, skin is tanned  Neurological:     Mental Status: She is alert.     Comments: Oriented to self   Psychiatric:        Mood and Affect: Mood normal.        Behavior: Behavior normal.    Vital Signs: BP 112/66 (BP Location: Right Arm)   Pulse 64   Temp 98 F (36.7 C)   Resp 20   Ht $R'5\' 1"'Dv$  (1.549 m)   Wt 45.5 kg   SpO2 97%   BMI 18.95 kg/m  Pain Scale: 0-10   Pain Score: 0-No pain SpO2: SpO2: 97 % O2 Device:SpO2: 97 % O2 Flow Rate: .   Palliative Assessment/Data: 40%     I discussed this patient's plan of care  with patient, patient's children Elenore Rota and Butch Penny.  Thank you for this consult. Palliative medicine will continue to follow and assist holistically.   Time Total: 70 minutes Greater than 50%  of this time was spent counseling and coordinating care related to the above assessment and plan.  Signed by: Jordan Hawks, DNP, FNP-BC Palliative Medicine    Please contact Palliative Medicine Team phone at [phone] for questions and concerns.  For individual provider: See Shea Evans

## 2021-02-27 NOTE — Progress Notes (Signed)
Daily Progress Note   Patient Name: [name] [name]       Date: 02/27/2021 DOB: [birth year]  Age: 87 y.o. MRN#: [id number] Attending Physician: Darlin Drop, DO Primary Care Physician: Raliegh Ip, DO Admit Date: 02/20/2021  Patient Profile: 87 y.o. female  with past medical history of  left foot nonhealing wound x2 weeks, PAF on Eliquis, SSS status post PPM, HTN, HLD, dementia presented to ED on 02/20/21 from home after unwitnessed fall with unknown downtime. In ED patient was found to be fluid overload, chest x-ray showed bilateral pleural effusion, his exam showed 2+ bilateral pitting edema. Patient was admitted on 02/20/2021 with sepsis, acute/new CHF, AKI, left hip pain, transaminitis, moderate protein calorie malnutrition.   Subjective: Chart reviewed. Patient has been deemed eligible for hospice facility in [place]. However, patient tested positive for COVID on 02/26/21 and they will not accept patient unless 10 days have passed or there are 2 negative tests 24 hours apart.   Patient tested negative COVID today and will be tested again tomorrow.   I spoke with patient's daughter Lupita Leash by phone. She confirmed plan to transfer to hospice facility in [facility] and is hopeful her mother tests negative again tomorrow.    Length of Stay: 7   Physical Exam Vitals reviewed.  Constitutional:      General: She is not in acute distress.    Appearance: She is cachectic. She is ill-appearing.  Pulmonary:     Effort: Pulmonary effort is normal.  Neurological:     Mental Status: She is alert.     Motor: Weakness present.  Psychiatric:        Cognition and Memory: Cognition is impaired.            Vital Signs: BP 112/66 (BP Location: Right Arm)   Pulse 64   Temp 98 F  (36.7 C)   Resp 20   Ht 5\' 1"  (1.549 m)   Wt 45.5 kg   SpO2 97%   BMI 18.95 kg/m  SpO2: SpO2: 97 % O2 Device: O2 Device: Room Air O2 Flow Rate:     LBM: Last BM Date: 02/26/21 Baseline Weight: Weight: 46 kg Most recent weight: Weight: 45.5 kg       Palliative Assessment/Data: PPS 10-20%      Palliative Care Assessment &  Plan   Assessment: Pacemaker infection Severe foot infection with exposed tendon Sepsis Recurrent falls Severe protein calorie malnutrition AKI Concern about end of life   Recommendations/Plan: Continue current medical treatment without escalation of care  Continue DNR/DNI as previously documented - durable DNR form is in shadow chart Transfer to [place] pending 2nd negative COVID test PMT will continue to support as needed   Goals of Care and Additional Recommendations: Limitations on Scope of Treatment: Avoid Hospitalization and no artificial feeding  Code Status: DNR/DNI   Prognosis: Poor, 4-6 weeks  Discharge Planning: Hospice facility    Thank you for allowing the Palliative Medicine Team to assist in the care of this patient.   Total Time 15 minutes Prolonged Time Billed  no       Greater than 50%  of this time was spent counseling and coordinating care related to the above assessment and plan.  Lavena Bullion, NP  Please contact Palliative Medicine Team phone at [phone] for questions and concerns.

## 2021-02-27 NOTE — TOC Progression Note (Addendum)
Transition of Care [facility]) - Progression Note    Patient Details  Name: [name] [name] MRN: [id number] Date of Birth: 24-Feb-1925  Transition of Care [facility]) CM/SW Contact  Ivette Loyal, [place] Phone Number: 02/27/2021, 3:13 PM  Clinical Narrative:    Pt covid came back negative, CSW contacted [facility] to inquire about them still accepting pt. Admissions will contact CSW back after speaking with the medical director.  Cassandra at [facility] contacted CSW back and shared that they would need 2 negative covid tests 24 hours apart in order to accept pt. CSW will follow up with pt daughter and medical team.   Expected Discharge Plan: Home w Home Health Services Barriers to Discharge: Continued Medical Work up  Expected Discharge Plan and Services Expected Discharge Plan: Home w Home Health Services In-house Referral: Clinical Social Work Discharge Planning Services: CM Consult Post Acute Care Choice: Home Health Living arrangements for the past 2 months: Single Family Home Expected Discharge Date: 02/27/21                                     Social Determinants of Health (SDOH) Interventions    Readmission Risk Interventions No flowsheet data found.

## 2021-02-27 NOTE — Progress Notes (Signed)
Occupational Therapy Treatment Patient Details Name: [name] [name] MRN: [id number] DOB: 04-Mar-1925 Today's Date: 02/27/2021   History of present illness Pt is a 87 year old woman admitted after falling on 02/20/21.  Pt + sepsis PMH: non healing wound on dorsum of L foot sustained when walking in her yard barefoot and encountering yellow jackets, has been being treated by PCP and orthopedist wth outpt debridement, HLD, PAF, hypothyroid, SSS with pacemaker, mild dementia.   OT comments  Patient received in bed and agreeable to get getting up into recliner. Patient required total assist to donn socks and min assist to get to EOB.  Patient did not believe she would be able to stand but attempted with encouragement and required min assist to power up and to transfer to recliner. Patient declined performing grooming standing at sink and performed in recliner. Patient performed one stand from recliner with min assist to position better in chair. AROM performed seated in recliner. Patient required more assistance with sit to stands on this date. Acute OT to continue to follow.    Recommendations for follow up therapy are one component of a multi-disciplinary discharge planning process, led by the attending physician.  Recommendations may be updated based on patient status, additional functional criteria and insurance authorization.    Follow Up Recommendations  Home health OT    Assistance Recommended at Discharge Intermittent Supervision/Assistance  Equipment Recommendations  None recommended by OT    Recommendations for Other Services      Precautions / Restrictions Precautions Precautions: Fall Precaution Comments: L foot wound Required Braces or Orthoses: Other Brace Other Brace: Prevalon boots in room for BLE's Restrictions Weight Bearing Restrictions: No       Mobility Bed Mobility Overal bed mobility: Needs Assistance Bed Mobility: Supine to Sit     Supine to sit: Min assist      General bed mobility comments: assistance with trunk to get to EOB    Transfers Overall transfer level: Needs assistance Equipment used: Rolling walker (2 wheels);None Transfers: Sit to/from Stand Sit to Stand: Min assist           General transfer comment: required min assist to power up from EOB     Balance Overall balance assessment: Needs assistance Sitting-balance support: Single extremity supported Sitting balance-Leahy Scale: Good Sitting balance - Comments: able to maintain sitting balance   Standing balance support: Bilateral upper extremity supported Standing balance-Leahy Scale: Fair Standing balance comment: reliant on BUE support with standing                           ADL either performed or assessed with clinical judgement   ADL Overall ADL's : Needs assistance/impaired     Grooming: Wash/dry hands;Wash/dry face;Brushing hair;Sitting Grooming Details (indicate cue type and reason): performed seated in recliner             Lower Body Dressing: Total assistance;Bed level Lower Body Dressing Details (indicate cue type and reason): socks             Functional mobility during ADLs: Minimal assistance;Rolling walker (2 wheels) General ADL Comments: performed functional mobilityshort distance from EOB to recliner    Extremity/Trunk Assessment              Vision       Perception     Praxis      Cognition Arousal/Alertness: Awake/alert Behavior During Therapy: WFL for tasks assessed/performed Overall Cognitive Status: History of  cognitive impairments - at baseline                                 General Comments: dementia, HOH          Exercises Exercises: General Upper Extremity General Exercises - Upper Extremity Shoulder Flexion: AROM;Both;10 reps Shoulder Extension: AROM;Both;10 reps   Shoulder Instructions       General Comments      Pertinent Vitals/ Pain       Pain Assessment:  Faces Faces Pain Scale: Hurts a little bit Pain Location: L foot Pain Descriptors / Indicators: Grimacing Pain Intervention(s): Monitored during session;Repositioned  Home Living                                          Prior Functioning/Environment              Frequency  Min 2X/week        Progress Toward Goals  OT Goals(current goals can now be found in the care plan section)  Progress towards OT goals: Progressing toward goals  Acute Rehab OT Goals OT Goal Formulation: With patient Time For Goal Achievement: 03/07/21 Potential to Achieve Goals: Good ADL Goals Pt Will Perform Grooming: standing;with modified independence Pt Will Perform Lower Body Bathing: with modified independence;sit to/from stand Pt Will Perform Lower Body Dressing: with modified independence;sit to/from stand Pt Will Transfer to Toilet: with modified independence;ambulating;regular height toilet Pt Will Perform Toileting - Clothing Manipulation and hygiene: with modified independence;sit to/from stand  Plan Discharge plan remains appropriate    Co-evaluation                 AM-PAC OT "6 Clicks" Daily Activity     Outcome Measure   Help from another person eating meals?: None Help from another person taking care of personal grooming?: A Little Help from another person toileting, which includes using toliet, bedpan, or urinal?: A Lot Help from another person bathing (including washing, rinsing, drying)?: A Lot Help from another person to put on and taking off regular upper body clothing?: A Little Help from another person to put on and taking off regular lower body clothing?: Total 6 Click Score: 15    End of Session Equipment Utilized During Treatment: Gait belt;Rolling walker (2 wheels)  OT Visit Diagnosis: Unsteadiness on feet (R26.81);Other abnormalities of gait and mobility (R26.89);Muscle weakness (generalized) (M62.81);Other symptoms and signs involving  cognitive function   Activity Tolerance Patient tolerated treatment well   Patient Left in chair;with call bell/phone within reach;with chair alarm set   Nurse Communication Mobility status        Time: 7414-2395 OT Time Calculation (min): 30 min  Charges: OT General Charges $OT Visit: 1 Visit OT Treatments $Self Care/Home Management : 8-22 mins $Therapeutic Exercise: 8-22 mins  Alfonse Flavors, OTA Acute Rehabilitation Services  Pager [phone] Office [phone]   Dewain Penning 02/27/2021, 1:24 PM

## 2021-02-28 DIAGNOSIS — M2550 Pain in unspecified joint: Secondary | ICD-10-CM | POA: Diagnosis not present

## 2021-02-28 DIAGNOSIS — Z7401 Bed confinement status: Secondary | ICD-10-CM | POA: Diagnosis not present

## 2021-02-28 LAB — RESP PANEL BY RT-PCR (FLU A&B, COVID) ARPGX2
Influenza A by PCR: NEGATIVE
Influenza B by PCR: NEGATIVE
SARS Coronavirus 2 by RT PCR: NEGATIVE

## 2021-02-28 NOTE — Progress Notes (Signed)
Report called to Lupita Leash RN at [facility] home, family aware of the transfer. No pain or distress noted. VS wnl

## 2021-02-28 NOTE — Progress Notes (Signed)
Daughter, Lupita Leash notified that patient was picked up by transport to [facility].

## 2021-02-28 NOTE — Progress Notes (Signed)
Patient now has had 2 negative COVID TESTS.  Plan for d/c to hospice of [facility]- please see d/c summary from Dr. Margo Aye on 11/29 [name] Canary DO

## 2021-03-12 ENCOUNTER — Ambulatory Visit: Payer: Medicare HMO | Admitting: Family Medicine

## 2021-04-01 DEATH — deceased

## 2022-08-31 IMAGING — CT CT HIP*L* W/O CM
2 of 5 series · 16 of 46 positions shown, 19 images · non-contrast
Comparison: Radiographs 02/20/2021

CLINICAL DATA: Fell.

EXAM:
CT OF THE LEFT HIP WITHOUT CONTRAST, CT OF THE RIGHT HIP WITHOUT
CONTRAST
TECHNIQUE: Multidetector CT imaging of BOTH HIPS was performed according to the
standard protocol. Multiplanar CT image reconstructions were also
generated.

[Series 5: hip 2.0 st · axial · 0.39mm/px · z∈[+1021,+1235]mm · 13 of 119 slices shown, 16 images]
[im 8/119  soft-tissue]
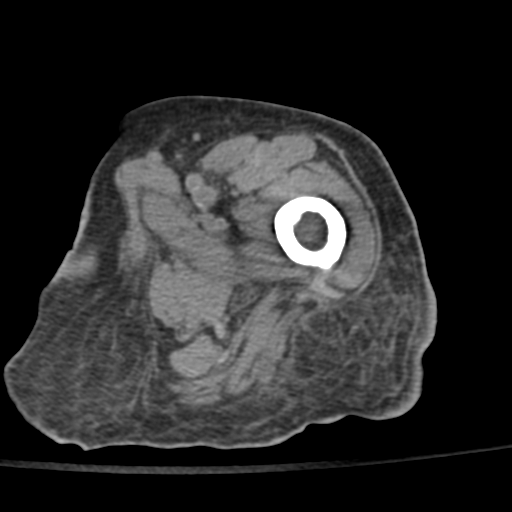
[im 8/119  bone]
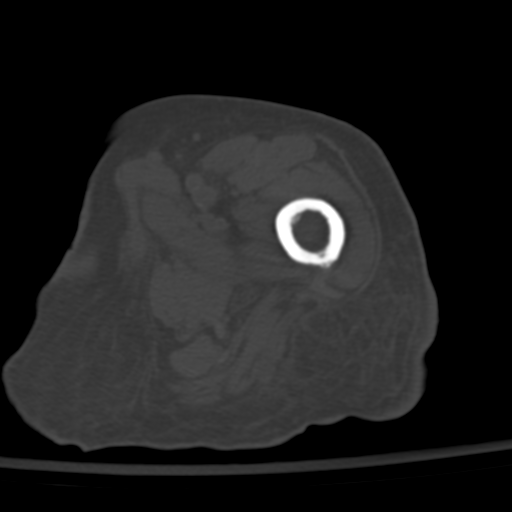
[im 20/119  soft-tissue]
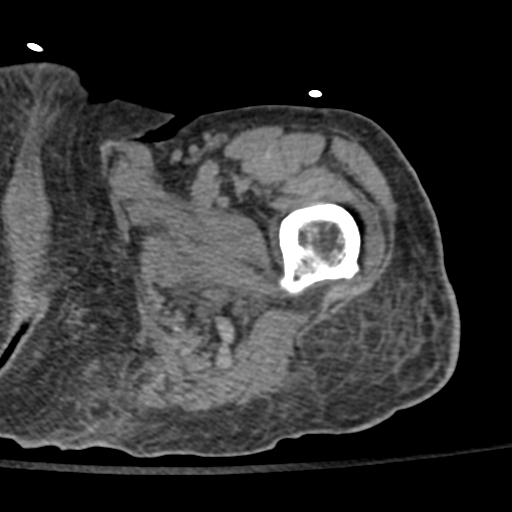
[im 31/119  soft-tissue]
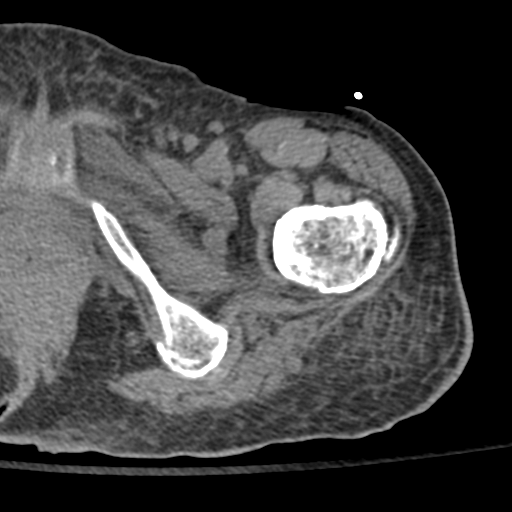
[im 42/119  soft-tissue]
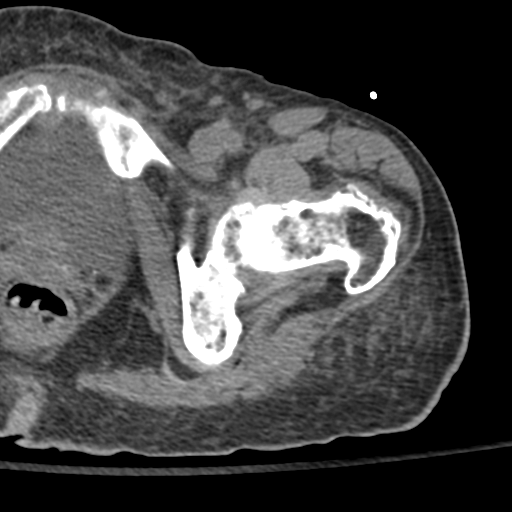
[im 54/119  soft-tissue]
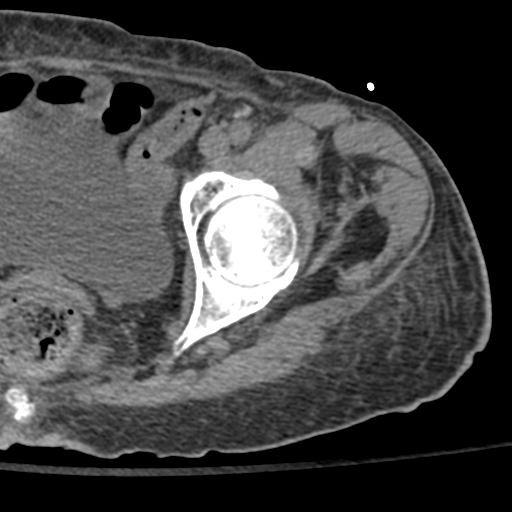
[im 65/119  soft-tissue]
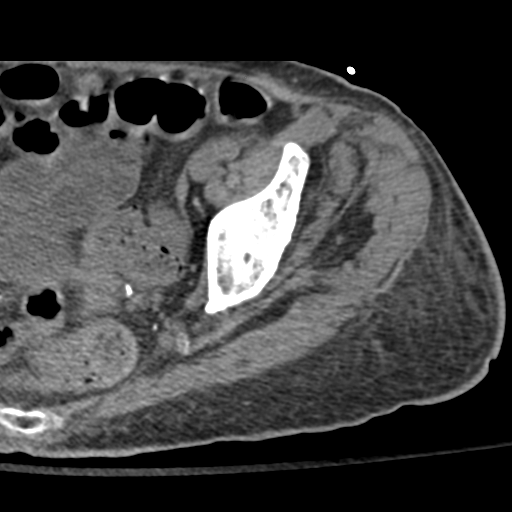
[im 77/119  soft-tissue]
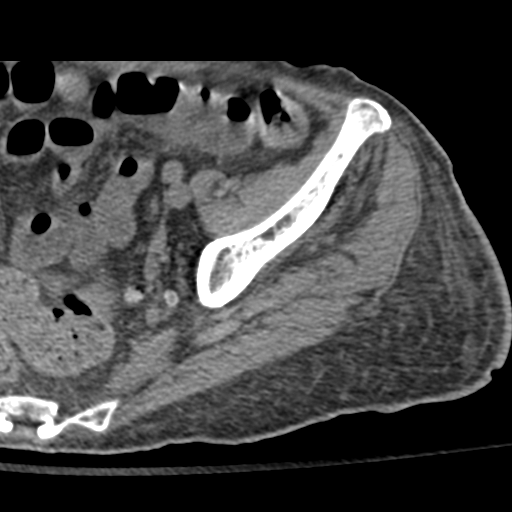
[im 88/119  soft-tissue]
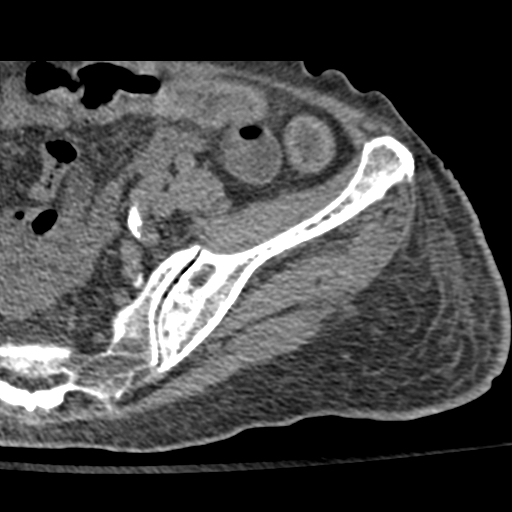
[im 99/119  soft-tissue]
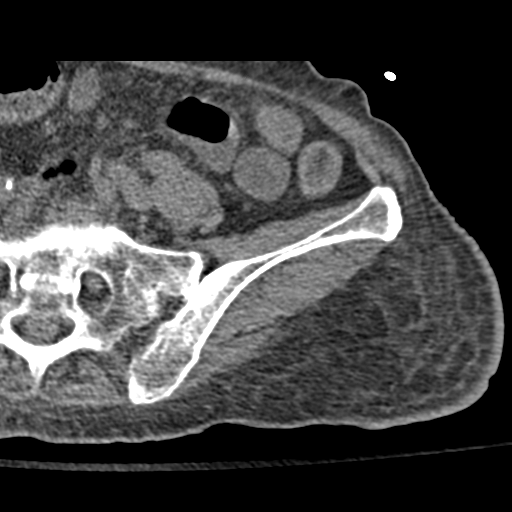
[im 99/119  bone]
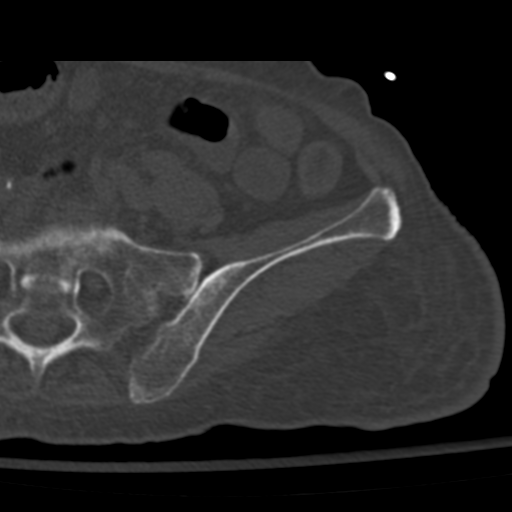
[im 103/119  lung]
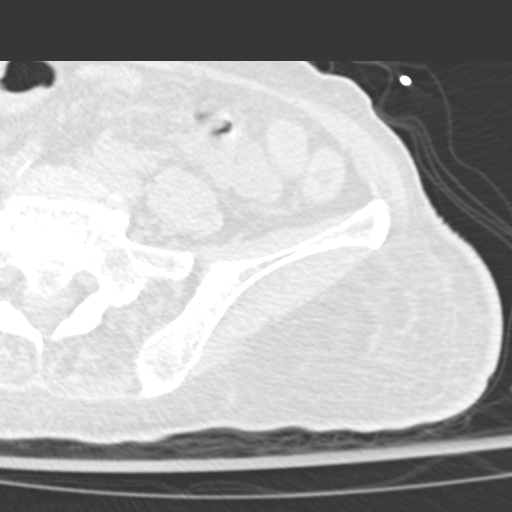
[im 107/119  lung]
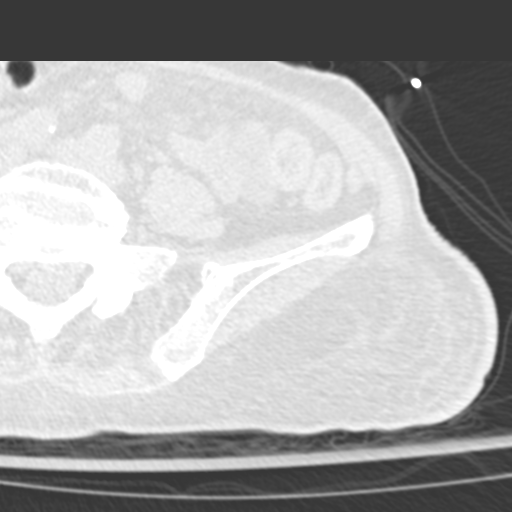
[im 111/119  soft-tissue]
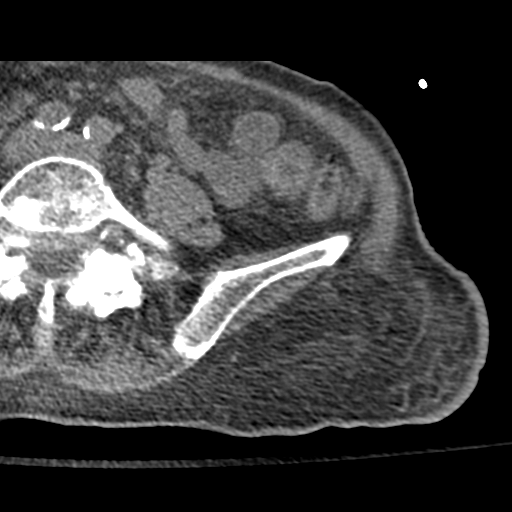
[im 111/119  lung]
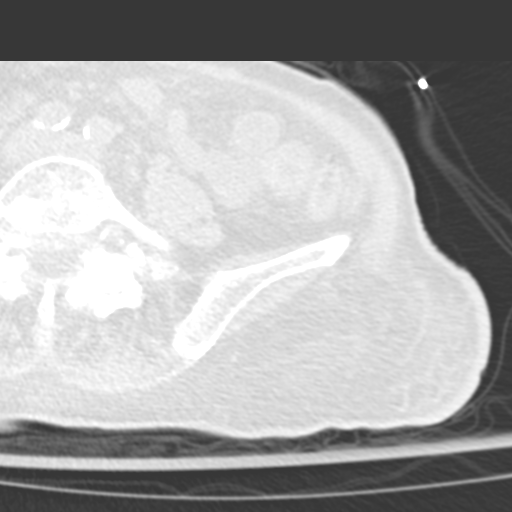
[im 115/119  lung]
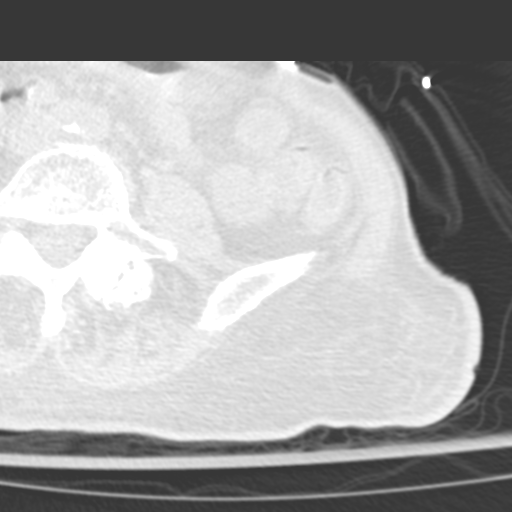

[Series 9: hip 2.0 cor. st · coronal · 0.43mm/px · 3 of 107 slices shown]
[im 27/107  soft-tissue]
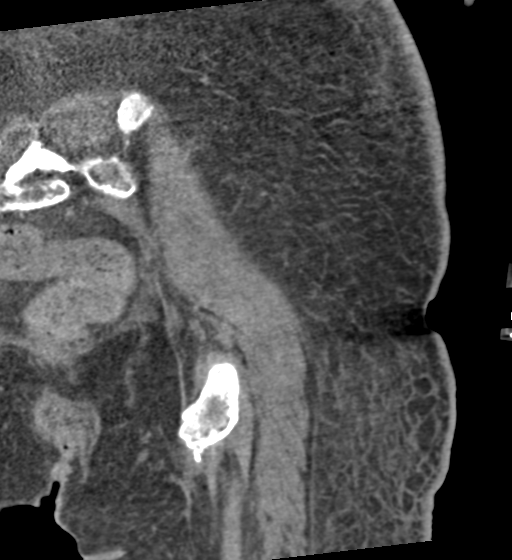
[im 54/107  soft-tissue]
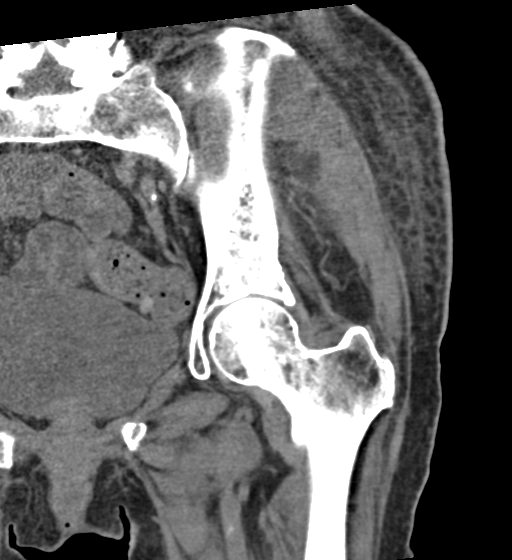
[im 80/107  soft-tissue]
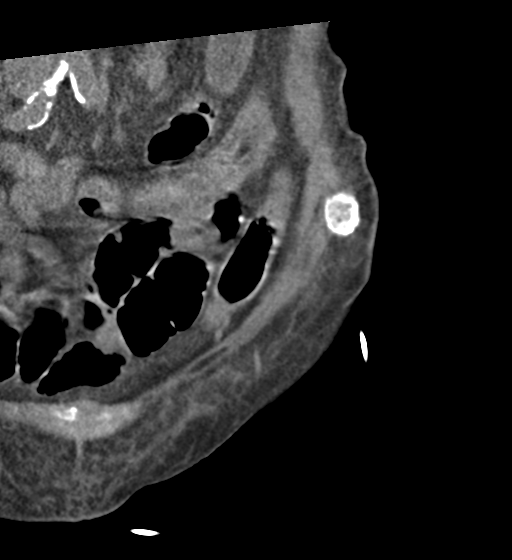

[16 of 46 positions shown; findings below may reference images not displayed]

FINDINGS: Both hips are normally located. Mild bilateral hip joint
degenerative changes given the patient's age. No acute hip fracture
or evidence of AVN.

The bony pelvis is intact. No pelvic fractures are identified.
Moderate degenerative changes at the pubic symphysis. The SI joints
are intact.

The hip and pelvic musculature are grossly normal by CT. No obvious
muscle tear or intramuscular hematoma. Mom no inguinal mass or
hernia.

No significant intrapelvic abnormalities.
IMPRESSION: 1. Mild bilateral hip joint degenerative changes.
2. No acute hip fracture or evidence of AVN.
3. No pelvic fractures are identified.

## 2022-08-31 IMAGING — CT CT HEAD W/O CM
3 of 4 series · 16 of 47 positions shown, 19 images · non-contrast
Comparison: None.

CLINICAL DATA: Head trauma

EXAM:
CT HEAD WITHOUT CONTRAST
TECHNIQUE: Contiguous axial images were obtained from the base of the skull
through the vertex without intravenous contrast.

[Series 4: head 2.0 h70h · axial · 0.40mm/px · z∈[-96,+38]mm · 10 of 75 slices shown, 13 images]
[im 4/75  brain]
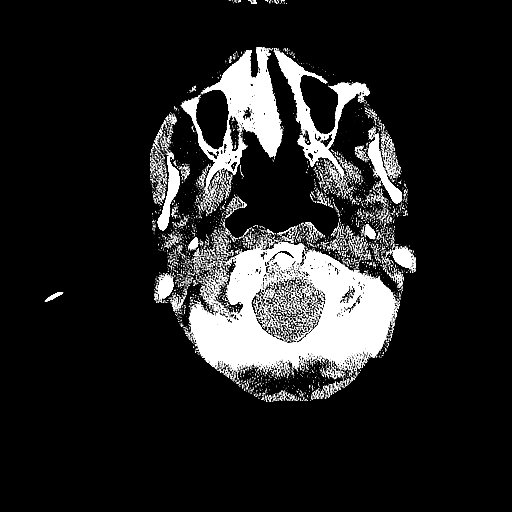
[im 4/75  bone]
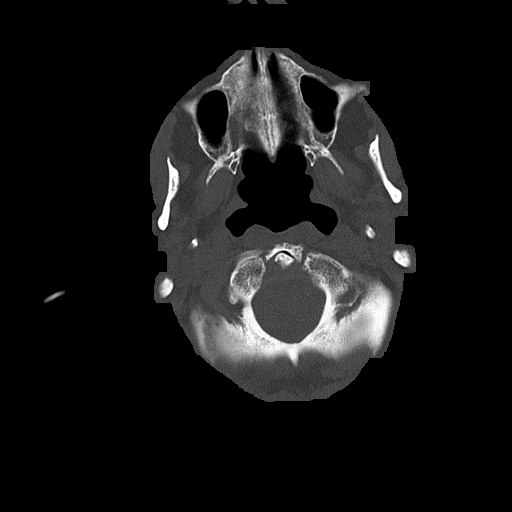
[im 12/75  brain]
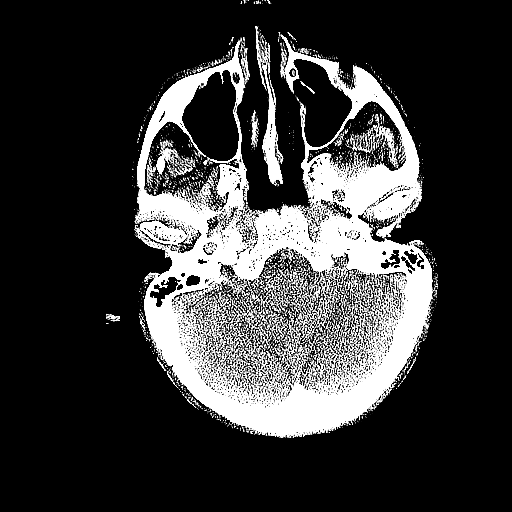
[im 19/75  brain]
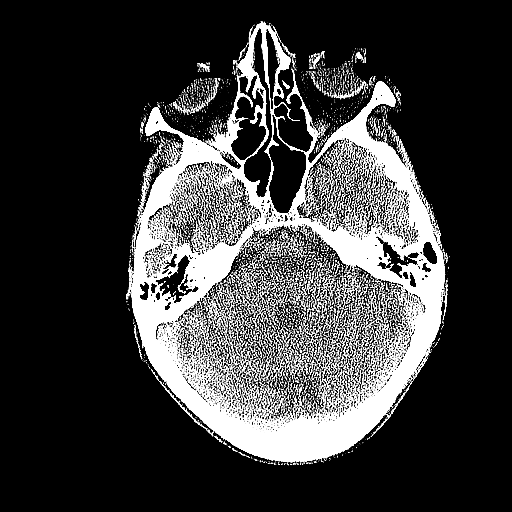
[im 26/75  brain]
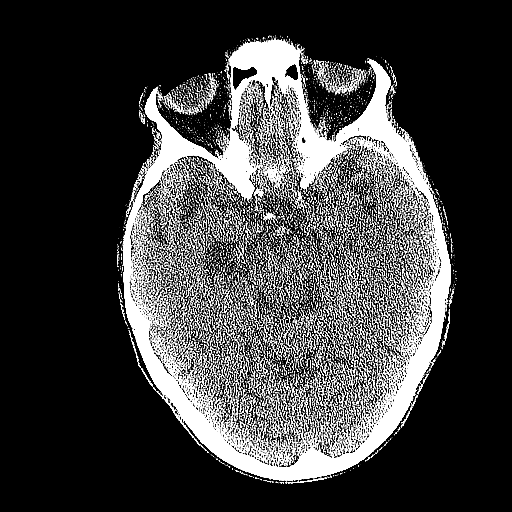
[im 34/75  brain]
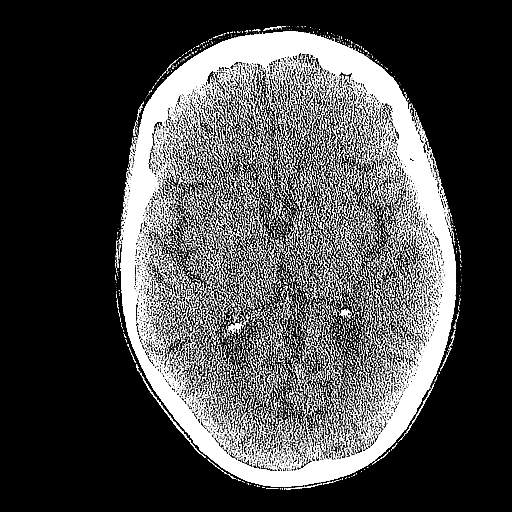
[im 34/75  bone]
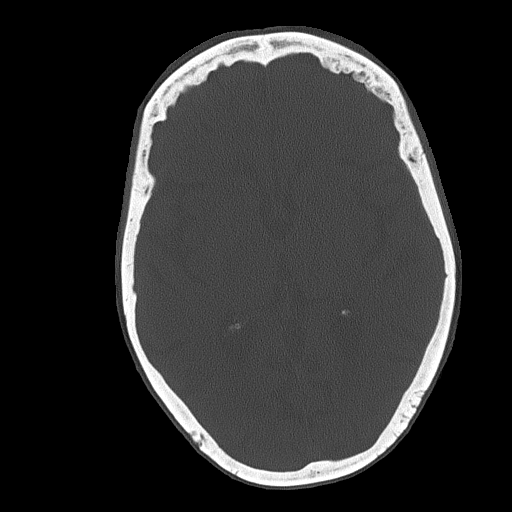
[im 41/75  brain]
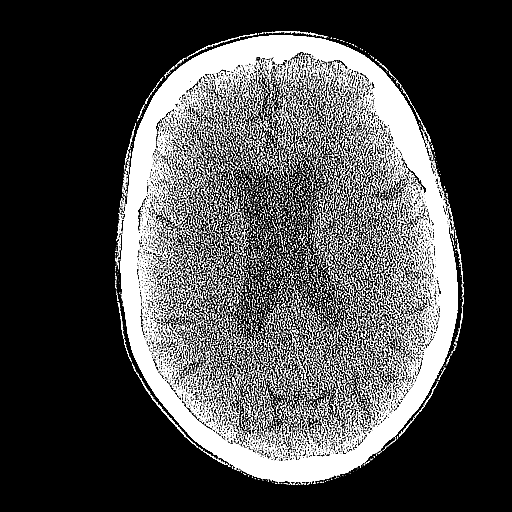
[im 49/75  brain]
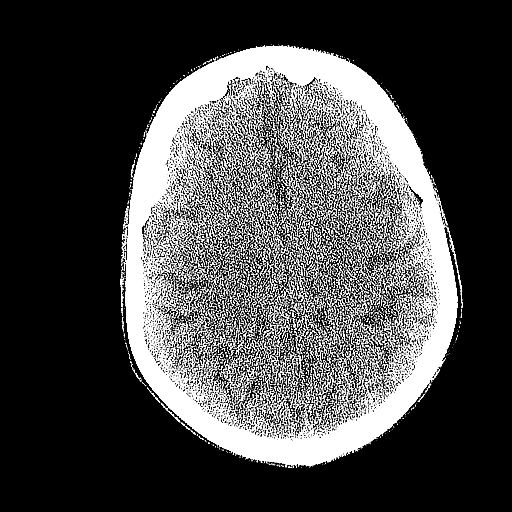
[im 56/75  brain]
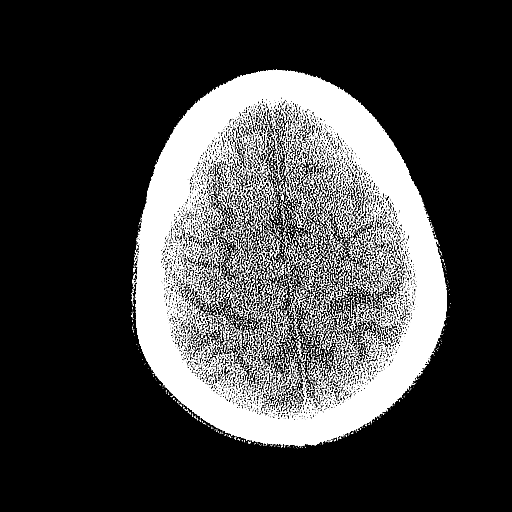
[im 63/75  brain]
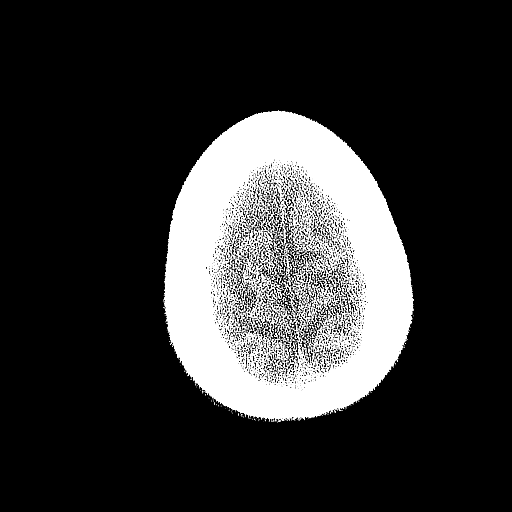
[im 63/75  bone]
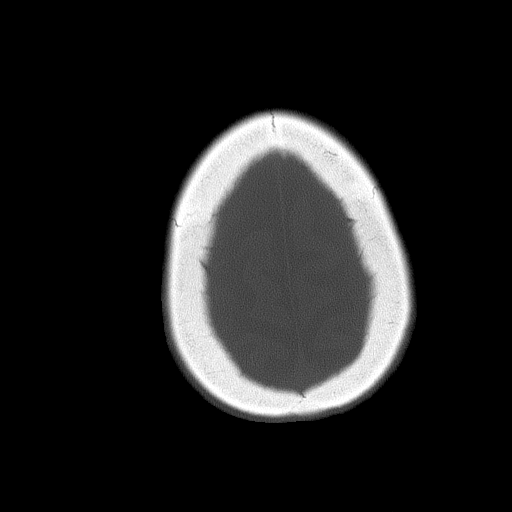
[im 71/75  brain]
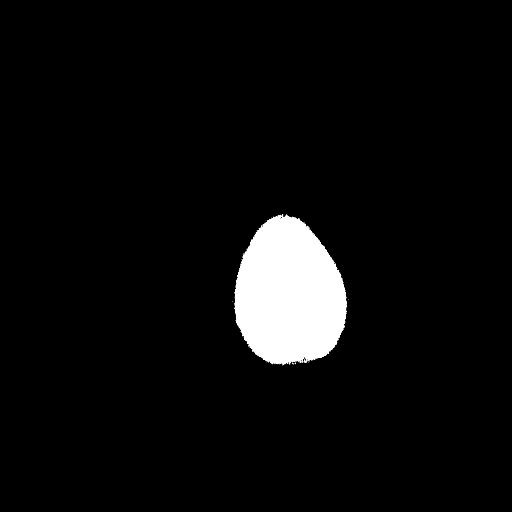

[Series 5: head 3.0 mpr cor · coronal · 0.30mm/px · 3 of 67 slices shown]
[im 23/67  brain]
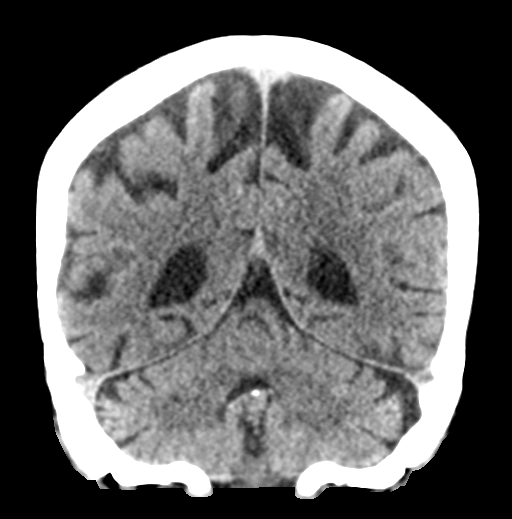
[im 30/67  brain]
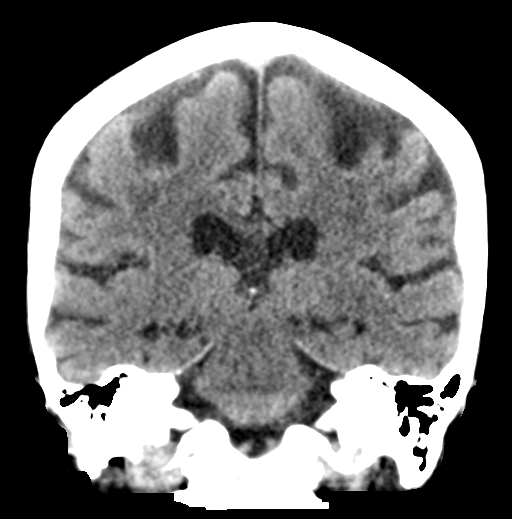
[im 37/67  brain]
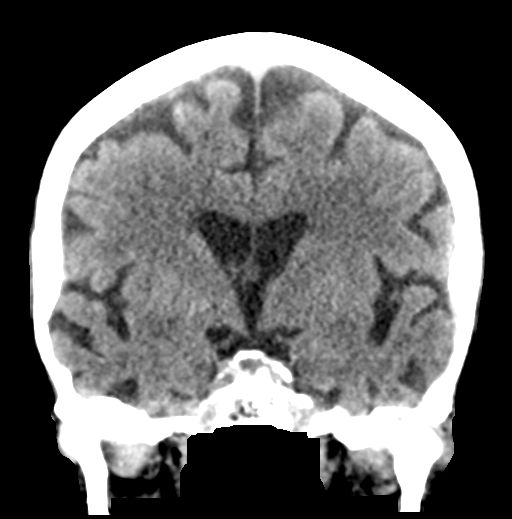

[Series 6: head 3.0 mpr sag · sagittal · 0.32mm/px · 3 of 50 slices shown]
[im 17/50  brain]
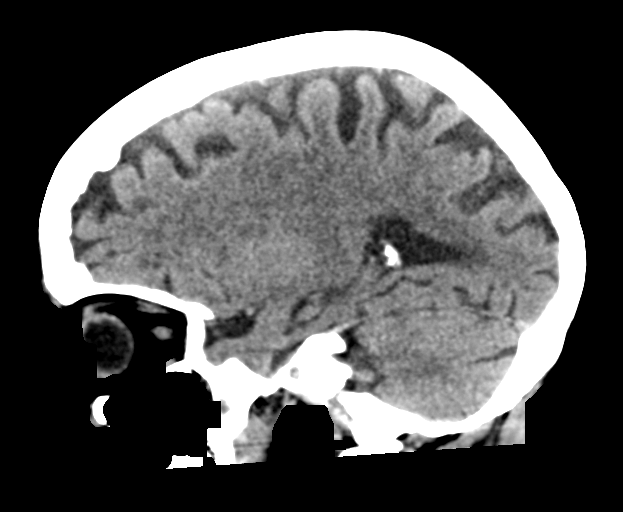
[im 25/50  brain]
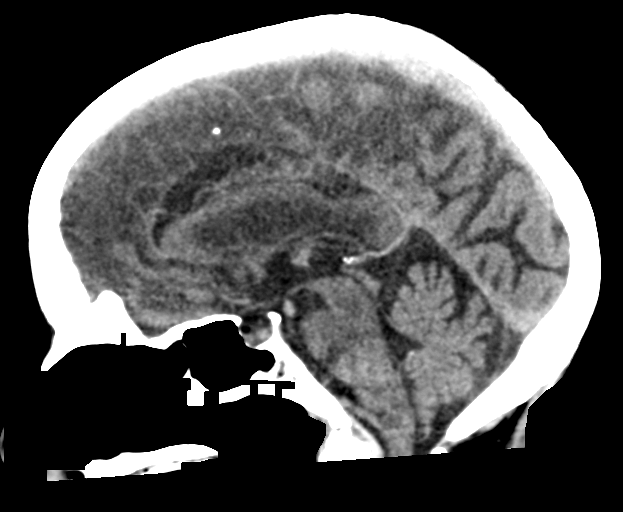
[im 33/50  brain]
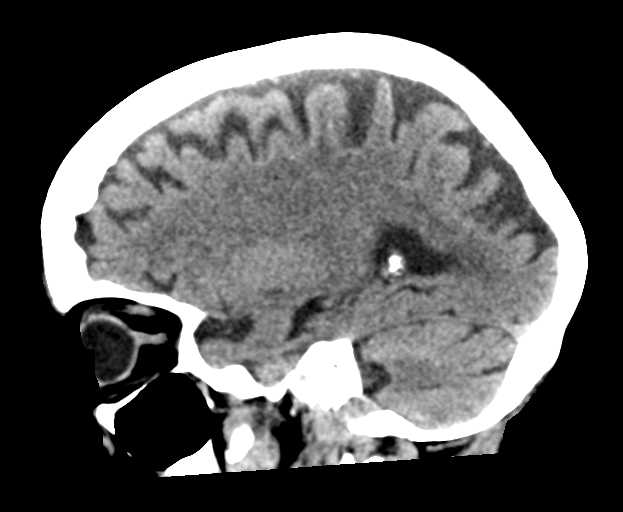

[16 of 47 positions shown; findings below may reference images not displayed]

FINDINGS: Brain: No acute intracranial hemorrhage, mass effect, or herniation.
No extra-axial fluid collections. No evidence of acute territorial
infarct. No hydrocephalus. Mild cortical volume loss. Patchy
hypodensities in the periventricular and subcortical white matter,
likely secondary to chronic microvascular ischemic changes.

Vascular: No hyperdense vessel or unexpected calcification.

Skull: Normal. Negative for fracture or focal lesion.

Sinuses/Orbits: No acute process identified. Surgical changes in the
globes.

Other: None.
IMPRESSION: Chronic changes with no acute intracranial process identified.
